# Patient Record
Sex: Male | Born: 1940 | Race: White | Hispanic: No | Marital: Married | Smoking: Former smoker
Health system: Southern US, Community
[De-identification: ages and names within clinical notes are randomized; demographics above are authoritative.]

## PROBLEM LIST (undated history)

## (undated) DIAGNOSIS — J90 Pleural effusion, not elsewhere classified: Secondary | ICD-10-CM

## (undated) DIAGNOSIS — I2510 Atherosclerotic heart disease of native coronary artery without angina pectoris: Secondary | ICD-10-CM

## (undated) DIAGNOSIS — I10 Essential (primary) hypertension: Secondary | ICD-10-CM

## (undated) DIAGNOSIS — R972 Elevated prostate specific antigen [PSA]: Secondary | ICD-10-CM

## (undated) DIAGNOSIS — I25810 Atherosclerosis of coronary artery bypass graft(s) without angina pectoris: Secondary | ICD-10-CM

## (undated) DIAGNOSIS — I480 Paroxysmal atrial fibrillation: Secondary | ICD-10-CM

## (undated) DIAGNOSIS — J302 Other seasonal allergic rhinitis: Secondary | ICD-10-CM

## (undated) DIAGNOSIS — K219 Gastro-esophageal reflux disease without esophagitis: Secondary | ICD-10-CM

## (undated) DIAGNOSIS — M1990 Unspecified osteoarthritis, unspecified site: Secondary | ICD-10-CM

## (undated) DIAGNOSIS — Z9289 Personal history of other medical treatment: Secondary | ICD-10-CM

## (undated) DIAGNOSIS — I200 Unstable angina: Secondary | ICD-10-CM

## (undated) DIAGNOSIS — R079 Chest pain, unspecified: Secondary | ICD-10-CM

## (undated) DIAGNOSIS — R072 Precordial pain: Secondary | ICD-10-CM

## (undated) DIAGNOSIS — E785 Hyperlipidemia, unspecified: Secondary | ICD-10-CM

## (undated) DIAGNOSIS — C801 Malignant (primary) neoplasm, unspecified: Secondary | ICD-10-CM

## (undated) DIAGNOSIS — I4891 Unspecified atrial fibrillation: Secondary | ICD-10-CM

## (undated) HISTORY — DX: Hyperlipidemia, unspecified: E78.5

## (undated) HISTORY — PX: CHOLECYSTECTOMY OPEN: SUR202

## (undated) HISTORY — DX: Unstable angina: I20.0

## (undated) HISTORY — DX: Paroxysmal atrial fibrillation: I48.0

## (undated) HISTORY — DX: Atherosclerosis of coronary artery bypass graft(s) without angina pectoris: I25.810

## (undated) HISTORY — DX: Gastro-esophageal reflux disease without esophagitis: K21.9

## (undated) HISTORY — DX: Pleural effusion, not elsewhere classified: J90

## (undated) HISTORY — DX: Personal history of other medical treatment: Z92.89

## (undated) HISTORY — PX: ESOPHAGOGASTRODUODENOSCOPY (EGD) WITH ESOPHAGEAL DILATION: SHX5812

## (undated) HISTORY — DX: Essential (primary) hypertension: I10

## (undated) HISTORY — DX: Atherosclerotic heart disease of native coronary artery without angina pectoris: I25.10

## (undated) HISTORY — PX: EXPLORATORY LAPAROTOMY: SUR591

## (undated) HISTORY — PX: HYDROCELE EXCISION: SHX482

## (undated) HISTORY — DX: Precordial pain: R07.2

## (undated) HISTORY — DX: Chest pain, unspecified: R07.9

## (undated) HISTORY — DX: Unspecified atrial fibrillation: I48.91

## (undated) HISTORY — PX: CARDIAC CATHETERIZATION: SHX172

---

## 2001-07-16 ENCOUNTER — Ambulatory Visit (HOSPITAL_COMMUNITY): Admission: RE | Admit: 2001-07-16 | Discharge: 2001-07-16 | Payer: Self-pay | Admitting: Internal Medicine

## 2001-07-16 ENCOUNTER — Encounter: Payer: Self-pay | Admitting: Internal Medicine

## 2002-02-04 ENCOUNTER — Encounter: Admission: RE | Admit: 2002-02-04 | Discharge: 2002-02-04 | Payer: Self-pay | Admitting: Otolaryngology

## 2002-02-04 ENCOUNTER — Encounter: Payer: Self-pay | Admitting: Otolaryngology

## 2002-04-05 ENCOUNTER — Other Ambulatory Visit: Admission: RE | Admit: 2002-04-05 | Discharge: 2002-04-05 | Payer: Self-pay | Admitting: General Surgery

## 2002-07-18 ENCOUNTER — Encounter: Payer: Self-pay | Admitting: Internal Medicine

## 2002-07-18 ENCOUNTER — Ambulatory Visit (HOSPITAL_COMMUNITY): Admission: RE | Admit: 2002-07-18 | Discharge: 2002-07-18 | Payer: Self-pay | Admitting: Internal Medicine

## 2003-01-30 ENCOUNTER — Ambulatory Visit (HOSPITAL_COMMUNITY): Admission: RE | Admit: 2003-01-30 | Discharge: 2003-01-30 | Payer: Self-pay | Admitting: Internal Medicine

## 2003-07-26 ENCOUNTER — Ambulatory Visit (HOSPITAL_COMMUNITY): Admission: RE | Admit: 2003-07-26 | Discharge: 2003-07-26 | Payer: Self-pay | Admitting: Internal Medicine

## 2003-10-06 ENCOUNTER — Ambulatory Visit (HOSPITAL_COMMUNITY): Admission: RE | Admit: 2003-10-06 | Discharge: 2003-10-06 | Payer: Self-pay | Admitting: Internal Medicine

## 2004-04-28 ENCOUNTER — Emergency Department (HOSPITAL_COMMUNITY): Admission: EM | Admit: 2004-04-28 | Discharge: 2004-04-28 | Payer: Self-pay | Admitting: Emergency Medicine

## 2005-01-23 ENCOUNTER — Ambulatory Visit (HOSPITAL_COMMUNITY): Admission: RE | Admit: 2005-01-23 | Discharge: 2005-01-23 | Payer: Self-pay | Admitting: Internal Medicine

## 2010-02-07 ENCOUNTER — Encounter: Payer: Self-pay | Admitting: Cardiovascular Disease

## 2010-02-07 ENCOUNTER — Ambulatory Visit: Payer: Self-pay | Admitting: Internal Medicine

## 2010-02-07 ENCOUNTER — Inpatient Hospital Stay (HOSPITAL_COMMUNITY): Admission: EM | Admit: 2010-02-07 | Discharge: 2010-02-20 | Payer: Self-pay | Admitting: Emergency Medicine

## 2010-02-08 ENCOUNTER — Ambulatory Visit: Payer: Self-pay | Admitting: Thoracic Surgery (Cardiothoracic Vascular Surgery)

## 2010-02-08 ENCOUNTER — Encounter (INDEPENDENT_AMBULATORY_CARE_PROVIDER_SITE_OTHER): Payer: Self-pay | Admitting: Internal Medicine

## 2010-02-10 ENCOUNTER — Encounter: Payer: Self-pay | Admitting: Thoracic Surgery (Cardiothoracic Vascular Surgery)

## 2010-02-13 HISTORY — PX: CORONARY ARTERY BYPASS GRAFT: SHX141

## 2010-02-25 ENCOUNTER — Ambulatory Visit: Payer: Self-pay | Admitting: Thoracic Surgery (Cardiothoracic Vascular Surgery)

## 2010-02-25 ENCOUNTER — Telehealth: Payer: Self-pay | Admitting: Cardiovascular Disease

## 2010-02-25 ENCOUNTER — Encounter: Payer: Self-pay | Admitting: Cardiovascular Disease

## 2010-02-25 LAB — CONVERTED CEMR LAB: Prothrombin Time: 17.4 s

## 2010-02-26 ENCOUNTER — Encounter: Payer: Self-pay | Admitting: Cardiovascular Disease

## 2010-02-28 ENCOUNTER — Telehealth: Payer: Self-pay | Admitting: Cardiovascular Disease

## 2010-02-28 ENCOUNTER — Encounter: Payer: Self-pay | Admitting: Cardiology

## 2010-03-01 ENCOUNTER — Ambulatory Visit: Payer: Self-pay | Admitting: Cardiothoracic Surgery

## 2010-03-07 DIAGNOSIS — I25810 Atherosclerosis of coronary artery bypass graft(s) without angina pectoris: Secondary | ICD-10-CM | POA: Insufficient documentation

## 2010-03-07 DIAGNOSIS — I10 Essential (primary) hypertension: Secondary | ICD-10-CM | POA: Insufficient documentation

## 2010-03-07 DIAGNOSIS — J90 Pleural effusion, not elsewhere classified: Secondary | ICD-10-CM | POA: Insufficient documentation

## 2010-03-07 DIAGNOSIS — K219 Gastro-esophageal reflux disease without esophagitis: Secondary | ICD-10-CM | POA: Insufficient documentation

## 2010-03-07 DIAGNOSIS — I4891 Unspecified atrial fibrillation: Secondary | ICD-10-CM | POA: Insufficient documentation

## 2010-03-07 DIAGNOSIS — E785 Hyperlipidemia, unspecified: Secondary | ICD-10-CM

## 2010-03-07 HISTORY — DX: Essential (primary) hypertension: I10

## 2010-03-07 HISTORY — DX: Gastro-esophageal reflux disease without esophagitis: K21.9

## 2010-03-07 HISTORY — DX: Pleural effusion, not elsewhere classified: J90

## 2010-03-07 HISTORY — DX: Atherosclerosis of coronary artery bypass graft(s) without angina pectoris: I25.810

## 2010-03-07 HISTORY — DX: Hyperlipidemia, unspecified: E78.5

## 2010-03-07 HISTORY — DX: Unspecified atrial fibrillation: I48.91

## 2010-03-11 ENCOUNTER — Encounter
Admission: RE | Admit: 2010-03-11 | Discharge: 2010-03-11 | Payer: Self-pay | Admitting: Thoracic Surgery (Cardiothoracic Vascular Surgery)

## 2010-03-11 ENCOUNTER — Ambulatory Visit: Payer: Self-pay | Admitting: Thoracic Surgery (Cardiothoracic Vascular Surgery)

## 2010-03-13 ENCOUNTER — Encounter: Payer: Self-pay | Admitting: Cardiovascular Disease

## 2010-03-13 ENCOUNTER — Encounter: Payer: Self-pay | Admitting: Internal Medicine

## 2010-03-13 LAB — CONVERTED CEMR LAB: Prothrombin Time: 19.1 s

## 2010-03-14 ENCOUNTER — Ambulatory Visit: Payer: Self-pay | Admitting: Cardiovascular Disease

## 2010-03-15 ENCOUNTER — Telehealth: Payer: Self-pay | Admitting: Cardiovascular Disease

## 2010-03-20 ENCOUNTER — Encounter: Payer: Self-pay | Admitting: Internal Medicine

## 2010-03-20 ENCOUNTER — Encounter: Payer: Self-pay | Admitting: Cardiovascular Disease

## 2010-03-20 LAB — CONVERTED CEMR LAB
POC INR: 1.7
Prothrombin Time: 16.9 s

## 2010-03-21 ENCOUNTER — Encounter (HOSPITAL_COMMUNITY): Admission: RE | Admit: 2010-03-21 | Discharge: 2010-06-24 | Payer: Self-pay | Admitting: Cardiovascular Disease

## 2010-03-22 ENCOUNTER — Telehealth: Payer: Self-pay | Admitting: Cardiovascular Disease

## 2010-03-27 ENCOUNTER — Ambulatory Visit: Payer: Self-pay | Admitting: Internal Medicine

## 2010-04-04 ENCOUNTER — Encounter: Payer: Self-pay | Admitting: Cardiovascular Disease

## 2010-04-10 ENCOUNTER — Ambulatory Visit: Payer: Self-pay | Admitting: Cardiology

## 2010-04-18 ENCOUNTER — Encounter: Payer: Self-pay | Admitting: Cardiovascular Disease

## 2010-04-19 ENCOUNTER — Ambulatory Visit: Payer: Self-pay | Admitting: Cardiology

## 2010-04-19 LAB — CONVERTED CEMR LAB: POC INR: 1.7

## 2010-04-22 ENCOUNTER — Encounter: Payer: Self-pay | Admitting: Cardiovascular Disease

## 2010-04-29 ENCOUNTER — Ambulatory Visit: Payer: Self-pay | Admitting: Cardiology

## 2010-04-29 LAB — CONVERTED CEMR LAB: POC INR: 1.9

## 2010-05-13 ENCOUNTER — Ambulatory Visit: Payer: Self-pay | Admitting: Cardiology

## 2010-05-14 ENCOUNTER — Ambulatory Visit: Payer: Self-pay | Admitting: Cardiovascular Disease

## 2010-05-14 ENCOUNTER — Ambulatory Visit: Payer: Self-pay

## 2010-05-15 LAB — CONVERTED CEMR LAB
ALT: 18 units/L (ref 0–53)
Alkaline Phosphatase: 64 units/L (ref 39–117)
CO2: 28 meq/L (ref 19–32)
Calcium: 8.7 mg/dL (ref 8.4–10.5)
Creatinine, Ser: 1 mg/dL (ref 0.4–1.5)
GFR calc non Af Amer: 80.34 mL/min (ref >60)
Glucose, Bld: 92 mg/dL (ref 70–99)
LDL Cholesterol: 55 mg/dL (ref 0–99)
Sodium: 145 meq/L (ref 135–145)
Total Protein: 6.5 g/dL (ref 6.0–8.3)

## 2010-05-27 ENCOUNTER — Ambulatory Visit: Payer: Self-pay | Admitting: Cardiology

## 2010-05-27 LAB — CONVERTED CEMR LAB: POC INR: 1.8

## 2010-06-03 ENCOUNTER — Encounter: Payer: Self-pay | Admitting: Cardiovascular Disease

## 2010-06-07 ENCOUNTER — Encounter: Payer: Self-pay | Admitting: Cardiovascular Disease

## 2010-06-10 ENCOUNTER — Encounter: Payer: Self-pay | Admitting: Cardiovascular Disease

## 2010-06-10 ENCOUNTER — Ambulatory Visit: Payer: Self-pay | Admitting: Thoracic Surgery (Cardiothoracic Vascular Surgery)

## 2010-08-15 ENCOUNTER — Encounter: Payer: Self-pay | Admitting: Cardiovascular Disease

## 2010-10-22 ENCOUNTER — Encounter: Payer: Self-pay | Admitting: Cardiovascular Disease

## 2010-11-11 ENCOUNTER — Encounter: Payer: Self-pay | Admitting: Cardiovascular Disease

## 2010-11-11 ENCOUNTER — Ambulatory Visit
Admission: RE | Admit: 2010-11-11 | Discharge: 2010-11-11 | Payer: Self-pay | Source: Home / Self Care | Attending: Cardiovascular Disease | Admitting: Cardiovascular Disease

## 2010-12-03 NOTE — Medication Information (Signed)
Summary: [facility]in Clinic  Anticoagulant Therapy  Managed by: Weston Brass, PharmD Referring MD: Juliette Alcide MD: Excell Seltzer MD, Casimiro Needle Indication 1: Atrial Fibrillation Lab Used: [facility] Site: [facility] PT 17.4 INR POC 1.7  Dietary changes: no    Health status changes: no    Bleeding/hemorrhagic complications: yes       Details: RN states pt has hematoma on leg where graft was harvested.  He has appt with MD today.   Recent/future hospitalizations: yes       Details: Recent CABG- developed Afib after procedure.  Discharged 4/21  Any changes in medication regimen? yes       Details: on amiodarone  Recent/future dental: no  Any missed doses?: no       Is patient compliant with meds? yes      Comments: Spoke with pt's wife.  Gave her dosing instructions   Anticoagulation Management History:      His anticoagulation is being managed by telephone today.  Positive risk factors for bleeding include an age of 74 years or older.  The bleeding index is 'intermediate risk'.  Negative CHADS2 values include Age > 25 years old.  Prothrombin time is 17.4.  Anticoagulation responsible provider: Excell Seltzer MD, Casimiro Needle.  INR POC: 1.7.    Anticoagulation Management Assessment/Plan:      The target INR is 2.0-3.0.  The next INR is due 03/04/2010.  Anticoagulation instructions were given to home health nurse.  Results were reviewed/authorized by Weston Brass, PharmD.  He was notified by Weston Brass PharmD.         Current Anticoagulation Instructions: INR 1.7  Increase dose to 1 tablet every day except 1 1/2 tablet on Monday, Wednesday and Friday.  Gave orders to Tanya with [facility]

## 2010-12-03 NOTE — Miscellaneous (Signed)
Summary: [facility]   [facility]   Imported By: Roderic Ovens 05/30/2010 15:56:02  _____________________________________________________________________  External Attachment:    Type:   Image     Comment:   External Document

## 2010-12-03 NOTE — Procedures (Signed)
Summary: Event  Event   Imported By: Marylou Mccoy 06/07/2010 08:42:02  _____________________________________________________________________  External Attachment:    Type:   Image     Comment:   External Document  Appended Document: Event Pt. aware

## 2010-12-03 NOTE — Letter (Signed)
Summary: [facility] Office Note   [facility] Office Note   Imported By: Roderic Ovens 06/21/2010 15:14:58  _____________________________________________________________________  External Attachment:    Type:   Image     Comment:   External Document

## 2010-12-03 NOTE — Progress Notes (Signed)
Summary: talk to nurse  Phone Note From Other Clinic   Caller: nurse Kenney Houseman Summary of Call: Per Kenney Houseman wants to know if pt can be on the CDP program. please call nurse at [phone] Initial call taken by: Edman Circle,  February 28, 2010 10:56 AM  Follow-up for Phone Call        Spoke with Kenney Houseman (home health RN seeing pt). She would like to enroll pt in CDP program. In this program pt is given heart rate monitor, scales, bp cuff and Oxygen saturation monitor and taught to use each AM. These vital signs are then transmitted via phone to Home Health RN daily to evaluate.  There is no additional charge to pt. RN will continue to see pt twice weekly. CDP program to go for 60 days and then re-evaluate to see if it should be continued. I gave approval to enroll pt in this Follow-up by: Dossie Arbour, RN, BSN,  February 28, 2010 11:16 AM  Additional Follow-up for Phone Call Additional follow up Details #1::        That sounds great. Can we help arrange this? cdm Additional Follow-up by: Verne Carrow, MD,  March 01, 2010 9:02 AM    Additional Follow-up for Phone Call Additional follow up Details #2::    Has been arranged. Follow-up by: Dossie Arbour, RN, BSN,  March 01, 2010 9:16 AM

## 2010-12-03 NOTE — Miscellaneous (Signed)
Summary: [facility]  [facility]   Imported By: Roderic Ovens 04/23/2010 14:54:00  _____________________________________________________________________  External Attachment:    Type:   Image     Comment:   External Document

## 2010-12-03 NOTE — Miscellaneous (Signed)
Summary: [facility] Face to Face Encounter Documentation  [facility] Face to Face Encounter Documentation   Imported By: Roderic Ovens 05/10/2010 15:19:53  _____________________________________________________________________  External Attachment:    Type:   Image     Comment:   External Document

## 2010-12-03 NOTE — Medication Information (Signed)
Summary: Cou[facility]in Clinic  Anticoagulant Therapy  Managed by: Bethena Midget, RN, BSN Referring MD: Juliette Alcide MD: Ladona Ridgel MD, Sharlot Gowda Indication 1: Atrial Fibrillation Lab Used: [facility] Site: [address] PT 19.1 INR POC 1.9 INR RANGE 2.0-3.0  Dietary changes: no    Health status changes: no    Bleeding/hemorrhagic complications: no    Recent/future hospitalizations: no    Any changes in medication regimen? no    Recent/future dental: no  Any missed doses?: no       Is patient compliant with meds? yes       Anticoagulation Management History:      His anticoagulation is being managed by telephone today.  Positive risk factors for bleeding include an age of 71 years or older.  The bleeding index is 'intermediate risk'.  Positive CHADS2 values include History of HTN.  Negative CHADS2 values include Age > 44 years old.  Prothrombin time is 19.1.  Anticoagulation responsible provider: Ladona Ridgel MD, Sharlot Gowda.  INR POC: 1.9.    Anticoagulation Management Assessment/Plan:      The patient's current anticoagulation dose is Warfarin sodium 2.5 mg tabs: Use as directed by Anticoagualtion Clinic.  The target INR is 2.0-3.0.  The next INR is due 03/20/2010.  Anticoagulation instructions were given to home health nurse.  Results were reviewed/authorized by Bethena Midget, RN, BSN.  He was notified by Bethena Midget, RN, BSN.         Prior Anticoagulation Instructions: 2 tabs = 5mg  today then resume recheck next week Thur or Fri when [facility] RN at home  Current Anticoagulation Instructions: INR 1.9 Today 5mg s then change dose 3.75mg s daily except 2.5mg  on T,T, and Sat. Recheck in one week. Orders given to [place] with [facility] while at home with dose and redraw date of 03/20/10.

## 2010-12-03 NOTE — Medication Information (Signed)
Summary: rov/sp  Anticoagulant Therapy  Managed by: Eda Keys, PharmD Referring MD: Juliette Alcide MD: Johney Frame MD, Fayrene Fearing Indication 1: Atrial Fibrillation Lab Used: [facility] Site: [address] INR POC 2.0 INR RANGE 2.0-3.0  Dietary changes: no    Health status changes: yes       Details: Pt has notice jitteriness since surgery and fatigued.  This has been going on for about a week.    Bleeding/hemorrhagic complications: no    Recent/future hospitalizations: no    Any changes in medication regimen? no    Recent/future dental: no  Any missed doses?: no       Is patient compliant with meds? yes       Allergies: 1)  ! Terramycin  Anticoagulation Management History:      The patient is taking warfarin and comes in today for a routine follow up visit.  Positive risk factors for bleeding include an age of 71 years or older.  The bleeding index is 'intermediate risk'.  Positive CHADS2 values include History of HTN.  Negative CHADS2 values include Age > 40 years old.  Anticoagulation responsible provider: Yazan Gatling MD, Fayrene Fearing.  INR POC: 2.0.  Cuvette Lot#: [id number].  Exp: 06/2011.    Anticoagulation Management Assessment/Plan:      The patient's current anticoagulation dose is Warfarin sodium 2.5 mg tabs: Use as directed by Anticoagualtion Clinic.  The target INR is 2.0-3.0.  The next INR is due 04/10/2010.  Anticoagulation instructions were given to home health nurse.  Results were reviewed/authorized by Eda Keys, PharmD.  He was notified by Eda Keys.         Prior Anticoagulation Instructions: INR 1.7  Take 2 tablets today then increase dose to 1 1/2 tablets every day except 1 tablet on Tuesday and Saturday.  Pt discharged from [facility] today.  WIll make appt to come into office.   Current Anticoagulation Instructions: INR 2.0  Start NEW dosing schedule of 1.5 tablets everyday, except 1 tablet on Tuesday.  Return to clinic in 2 weeks.

## 2010-12-03 NOTE — Miscellaneous (Signed)
Summary: [facility] Order Addend[facility]  [facility] Order Addend[facility]   Imported By: Roderic Ovens 04/17/2010 16:37:47  _____________________________________________________________________  External Attachment:    Type:   Image     Comment:   External Document

## 2010-12-03 NOTE — Progress Notes (Signed)
Summary: hypotension   Phone Note Other Incoming   Caller: Kenney Houseman- RN with [facility] Summary of Call: Kenney Houseman called today to report that the pt's bp on friday was 90/60. Today the pt's bp is 80/60 and 102/60 with a HR of 70. The pt is feeling weak, but no complaints of dizziness or lightheadedness when I asked the nurse. The pt is on amiodarone 200mg  two times a day, metoprolol 50mg  two times a day, and he is scheduled for his last dose of lasix 40mg  today. I will review with Dr. Excell Seltzer (DOD) and call the pt back. Initial call taken by: Sherri Rad, RN, BSN,  February 25, 2010 3:02 PM  Follow-up for Phone Call        I have reviewed the above with Dr. Excell Seltzer. Orders received to decrease metoprolol to 25mg  two times a day. I have called and discussed this with the pt's wife. She verbalizes understanding of the med change. The pt will f/u with Dr. Clifton James on 5/12.  Follow-up by: Sherri Rad, RN, BSN,  February 25, 2010 4:44 PM    New/Updated Medications: METOPROLOL TARTRATE 25 MG TABS (METOPROLOL TARTRATE) Take one tablet by mouth twice a day

## 2010-12-03 NOTE — Assessment & Plan Note (Signed)
Summary: PER CHECK OUT ALSO NEEDS LABS/L/LSAF   Visit Type:  Follow-up Primary Provider:  Dr Thea Silversmith  CC:  just sore.  History of Present Illness: 70 yo WM with history of HTN, hyperlipidemia and recent diagnosis of CAD with admission to [facility] 02/07/10 with c/o chest pain, found to have NSTEMI. Cath on 02/09/10 with severe stenosis of mid LAD at bifurcation of large diagonal branch which I was unable to cross with a wire. He then underwent 2V CABG by Dr. Cornelius Moras on 02/13/10. Post-operative course complicated by post-op anemia and atrial fibrillation.  He has had no chest pain or SOB.  He has had no awareness of irregularity of his heart rhythm since discharge home. He is feeling great. He has been at cardiac rehab and is doing well. His chest wall is sore after exercise. He has been followed in coumadin clinic.   Current Medications (verified): 1)  Lopressor 50 Mg Tabs (Metoprolol Tartrate) .... 1/2 Tab Two Times A Day 2)  Warfarin Sodium 2.5 Mg Tabs (Warfarin Sodium) .... Use As Directed By Anticoagualtion Clinic 3)  Aspirin 81 Mg Tbec (Aspirin) .... Take One Tablet By Mouth Daily 4)  Crestor 40 Mg Tabs (Rosuvastatin Calcium) .Marland Kitchen.. 1 Tab At Bedtime 5)  B-Complex/b-12  Tabs (B Complex Vitamins) .Marland Kitchen.. 1 Tab Once Daily 6)  Nexium 40 Mg Cpdr (Esomeprazole Magnesium) .Marland Kitchen.. 1 Tab Once Daily  Allergies (verified): 1)  ! 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 No tobacco No alcohol No illicit drug use Married, 2 children Retired, worked at a [employer], clinical.   Review of Systems  The patient denies fatigue, malaise, fever, weight gain/loss,  vision loss, decreased hearing, hoarseness, chest pain, palpitations, shortness of breath, prolonged cough, wheezing, sleep apnea, coughing up blood, abdominal pain, blood in stool, nausea, vomiting, diarrhea, heartburn, incontinence, blood in urine, muscle weakness, joint pain, leg swelling, rash, skin lesions, headache, fainting, dizziness, depression, anxiety, enlarged lymph nodes, easy bruising or bleeding, and environmental allergies.    Vital Signs:  Patient profile:   70 year old male Height:      73 inches Weight:      182 pounds BMI:     24.10 Pulse rate:   75 / minute BP sitting:   112 / 62  (left arm) Cuff size:   regular  Vitals Entered By: Hardin Negus, RMA (May 14, 2010 8:47 AM)  Physical Exam  General:  General: Well developed, well nourished, NAD Musculoskeletal: Muscle strength 5/5 all ext Psychiatric: Mood and affect normal Neck: No JVD, no carotid bruits, no thyromegaly, no lymphadenopathy. Lungs:Clear bilaterally, no wheezes, rhonci, crackles CV: RRR no murmurs, gallops rubs Abdomen: soft, NT, ND, BS present Extremities: No edema, pulses 2+.    Impression & Recommendations:  Problem # 1:  CAD, ARTERY BYPASS GRAFT (ICD-414.04) Doing well. Stable post bypass. Continue statin, ASA and beta blocker.  Continue cardiac rehab.   His updated medication list for this problem includes:    Lopressor 50 Mg Tabs (Metoprolol tartrate) .Marland Kitchen... 1/2 tab two times a day    Warfarin Sodium 2.5 Mg Tabs (Warfarin sodium) ..... Use as directed by anticoagualtion clinic    Aspirin 81 Mg Tbec (Aspirin) .Marland Kitchen... Take one tablet by mouth daily  Problem # 2:  PAROXYSMAL ATRIAL FIBRILLATION (ICD-427.31)  He is on coumadin. Will have him wear 21 day event monitor to see if he is holding sinus rhythm. If he has no evidence of atrial fib, will stop coumadin.   His updated medication list for this problem includes:    Lopressor 50 Mg Tabs (Metoprolol tartrate) .Marland Kitchen... 1/2 tab two times a  day    Warfarin Sodium 2.5 Mg Tabs (Warfarin sodium) ..... Use as directed by anticoagualtion clinic    Aspirin 81 Mg Tbec (Aspirin) .Marland Kitchen... Take one tablet by mouth daily  Orders: Event (Event) TLB-BMP (Basic Metabolic Panel-BMET) (80048-METABOL)  Problem # 3:  HYPERLIPIDEMIA (ICD-272.4)  Check LFTs, lipids and BMET today.  Continue statin.  His updated medication list for this problem includes:    Crestor 40 Mg Tabs (Rosuvastatin calcium) .Marland Kitchen... 1 tab at bedtime  Orders: TLB-Lipid Panel (80061-LIPID) TLB-Hepatic/Liver Function Pnl (80076-HEPATIC)  Patient Instructions: 1)  Your physician recommends that you schedule a follow-up appointment in: 6 months 2)  Your physician has recommended that you wear an event monitor.  Event monitors are medical devices that record the heart's electrical activity. Doctors most often use these monitors to diagnose arrhythmias. Arrhythmias are problems with the speed or rhythm of the heartbeat. The monitor is a small, portable device. You can wear one while you do your normal daily activities. This is usually used to diagnose what is causing palpitations/syncope (passing out).  Appended Document: PER CHECK OUT ALSO NEEDS LABS/L/LSAF Reviewed event monitor. No evidence of atrial fib. He can stop coumadin. Can we call him Ruben Burton? thanks, chris  Appended Document: PER CHECK OUT ALSO NEEDS LABS/L/LSAF left message to call back.  Appended Document: PER CHECK OUT ALSO NEEDS LABS/L/LSAF Pt. notified he can stop coumadin when here for coumadin appt today.

## 2010-12-03 NOTE — Medication Information (Signed)
Summary: Coumadin Clinic  Anticoagulant Therapy  Managed by: Inactive Referring MD: Clifton James PCP: Dr Laure Kidney MD: Eden Emms MD, Theron Arista Indication 1: Atrial Fibrillation Lab Used: [facility] Site: [address] INR RANGE 2.0-3.0          Comments: Pt in sinus rhythm after 21 day event monitor.  Dr. Clifton James stopped Coumadin.   Allergies: 1)  ! Terramycin  Anticoagulation Management History:      Positive risk factors for bleeding include an age of 71 years or older.  The bleeding index is 'intermediate risk'.  Positive CHADS2 values include History of HTN.  Negative CHADS2 values include Age > 64 years old.  Anticoagulation responsible provider: Eden Emms MD, Theron Arista.  Exp: 08/2011.    Anticoagulation Management Assessment/Plan:      The patient's current anticoagulation dose is Warfarin sodium 2.5 mg tabs: Use as directed by Anticoagualtion Clinic.  The target INR is 2.0-3.0.  The next INR is due 06/07/2010.  Anticoagulation instructions were given to patient.  Results were reviewed/authorized by Inactive.         Prior Anticoagulation Instructions: INR 1.8 Today take 3 tablets then change dose to 2 tablets everyday except 1.5 tablets on Tuesdays, Thursdays and Saturdays. Recheck in 2 weeks.

## 2010-12-03 NOTE — Progress Notes (Signed)
Summary: Calling about the blood checks  Phone Note Call from Patient Call back at Home Phone [phone]   Caller: Ruben Burton Summary of Call: Pt wife calling regarding the pt coumadin being checked Initial call taken by: Judie Grieve,  Mar 15, 2010 3:33 PM  Follow-up for Phone Call        Spoke with pt's wife. She states last home health nurse visit will be next Wednesday and INR will be checked at that time. I told wife that after we receive these results the coumadin clinic in our office will be in touch with them about scheduling follow up appts in the coumadin clinic. Follow-up by: Dossie Arbour, RN, BSN,  Mar 15, 2010 5:17 PM

## 2010-12-03 NOTE — Progress Notes (Signed)
Summary: Rehab form  Phone Note From Other Clinic   Caller: carlette Summary of Call: Per cardiac rehab needs order for heart rate for rehab.  Initial call taken by: Edman Circle,  Mar 22, 2010 3:19 PM  Follow-up for Phone Call        Dr Clifton James is back in the office on 03/26/10.  Form is in Dr Gibson Ramp reports to sign folder. Will address at that time.  Follow-up by: Julieta Gutting, RN, BSN,  Mar 22, 2010 3:32 PM  Additional Follow-up for Phone Call Additional follow up Details #1::        Form completed and faxed to cardiac rehab.  Additional Follow-up by: Dossie Arbour, RN, BSN,  Mar 26, 2010 12:53 PM

## 2010-12-03 NOTE — Letter (Signed)
Summary: Cardiac Rehab   Cardiac Rehab   Imported By: Erle Crocker 04/10/2010 11:17:28  _____________________________________________________________________  External Attachment:    Type:   Image     Comment:   External Document

## 2010-12-03 NOTE — Medication Information (Signed)
Summary: rov/sp  Anticoagulant Therapy  Managed by: Cloyde Reams, RN, BSN Referring MD: Juliette Alcide MD: Shirlee Latch MD, Serai Tukes Indication 1: Atrial Fibrillation Lab Used: [facility] Site: [address] INR POC 1.7 INR RANGE 2.0-3.0  Dietary changes: no    Health status changes: no    Bleeding/hemorrhagic complications: no    Recent/future hospitalizations: no    Any changes in medication regimen? no    Recent/future dental: no  Any missed doses?: no       Is patient compliant with meds? yes       Allergies: 1)  ! Terramycin  Anticoagulation Management History:      The patient is taking warfarin and comes in today for a routine follow up visit.  Positive risk factors for bleeding include an age of 8 years or older.  The bleeding index is 'intermediate risk'.  Positive CHADS2 values include History of HTN.  Negative CHADS2 values include Age > 54 years old.  Anticoagulation responsible provider: Shirlee Latch MD, Bodhi Stenglein.  INR POC: 1.7.  Cuvette Lot#: [id number].  Exp: 07/2011.    Anticoagulation Management Assessment/Plan:      The patient's current anticoagulation dose is Warfarin sodium 2.5 mg tabs: Use as directed by Anticoagualtion Clinic.  The target INR is 2.0-3.0.  The next INR is due 05/27/2010.  Anticoagulation instructions were given to patient.  Results were reviewed/authorized by Cloyde Reams, RN, BSN.  He was notified by Cloyde Reams RN.         Prior Anticoagulation Instructions: INR 1.9  Increase dose to 1 1/2 tablets every day except 2 tablets on Monday and Friday.   Current Anticoagulation Instructions: INR 1.7  Take 2.5 tablets today, then start taking 1.5 tablets daily except 2 tablets on Mondays, Wednesdays, and Fridays.  Recheck 2 weeks.

## 2010-12-03 NOTE — Medication Information (Signed)
Summary: rov/cb  Anticoagulant Therapy  Managed by: Weston Brass, PharmD Referring MD: Juliette Alcide MD: Jens Som MD, Arlys John Indication 1: Atrial Fibrillation Lab Used: [facility] Site: [facility] INR POC 1.9 INR RANGE 2.0-3.0  Dietary changes: no    Health status changes: no    Bleeding/hemorrhagic complications: no    Recent/future hospitalizations: no    Any changes in medication regimen? no    Recent/future dental: no  Any missed doses?: no       Is patient compliant with meds? yes       Allergies: 1)  ! Terramycin  Anticoagulation Management History:      The patient is taking warfarin and comes in today for a routine follow up visit.  Positive risk factors for bleeding include an age of 71 years or older.  The bleeding index is 'intermediate risk'.  Positive CHADS2 values include History of HTN.  Negative CHADS2 values include Age > 65 years old.  Anticoagulation responsible provider: Jens Som MD, Arlys John.  INR POC: 1.9.  Exp: 06/2011.    Anticoagulation Management Assessment/Plan:      The patient's current anticoagulation dose is Warfarin sodium 2.5 mg tabs: Use as directed by Anticoagualtion Clinic.  The target INR is 2.0-3.0.  The next INR is due 05/13/2010.  Anticoagulation instructions were given to patient.  Results were reviewed/authorized by Weston Brass, PharmD.  He was notified by Weston Brass PharmD.         Prior Anticoagulation Instructions: INR 1.7. Take 2 tablets today, then take 1.5 tablets daily except 2 tablets on Fridays. Recheck in 10 days.  Current Anticoagulation Instructions: INR 1.9  Increase dose to 1 1/2 tablets every day except 2 tablets on Monday and Friday.

## 2010-12-03 NOTE — Miscellaneous (Signed)
Summary: [facility]   [facility]   Imported By: Roderic Ovens 05/20/2010 13:12:09  _____________________________________________________________________  External Attachment:    Type:   Image     Comment:   External Document

## 2010-12-03 NOTE — Medication Information (Signed)
Summary: rov/sp  Anticoagulant Therapy  Managed by: Elaina Pattee, PharmD Referring MD: Juliette Alcide MD: Juanda Chance MD, Betzayda Braxton Indication 1: Atrial Fibrillation Lab Used: [facility] Site: [address] INR POC 1.7 INR RANGE 2.0-3.0  Dietary changes: no    Health status changes: no    Bleeding/hemorrhagic complications: no    Recent/future hospitalizations: no    Any changes in medication regimen? yes       Details: Stopped amiodarone recently.  Recent/future dental: no  Any missed doses?: no       Is patient compliant with meds? yes       Allergies: 1)  ! Terramycin  Anticoagulation Management History:      The patient is taking warfarin and comes in today for a routine follow up visit.  Positive risk factors for bleeding include an age of 71 years or older.  The bleeding index is 'intermediate risk'.  Positive CHADS2 values include History of HTN.  Negative CHADS2 values include Age > 68 years old.  Anticoagulation responsible provider: Juanda Chance MD, Smitty Cords.  INR POC: 1.7.  Cuvette Lot#: [id number].  Exp: 06/2011.    Anticoagulation Management Assessment/Plan:      The patient's current anticoagulation dose is Warfarin sodium 2.5 mg tabs: Use as directed by Anticoagualtion Clinic.  The target INR is 2.0-3.0.  The next INR is due 04/29/2010.  Anticoagulation instructions were given to patient.  Results were reviewed/authorized by Elaina Pattee, PharmD.  He was notified by Elaina Pattee, PharmD.         Prior Anticoagulation Instructions: INR 1.6  Take 2 tablets today and tomorrow then increase dose to 1 1/2 tablets every day.    Current Anticoagulation Instructions: INR 1.7. Take 2 tablets today, then take 1.5 tablets daily except 2 tablets on Fridays. Recheck in 10 days.

## 2010-12-03 NOTE — Procedures (Signed)
Summary: Summary Report  Summary Report   Imported By: Erle Crocker 06/11/2010 15:49:39  _____________________________________________________________________  External Attachment:    Type:   Image     Comment:   External Document

## 2010-12-03 NOTE — Assessment & Plan Note (Signed)
Summary: nph/post cabg/lg   Visit Type:  Follow-up  CC:  chest soreness at post operative site...no other complaints today.  History of Present Illness: 70 yo WM with history of HTN, hyperlipidemia and recent diagnosis of CAD with admission to [facility] 02/07/10 with c/o chest pain, found to have NSTEMI. Cath on 02/09/10 with severe stenosis of mid LAD at bifurcation of large diagonal branch which I was unable to cross with a wire. He then underwent 2V CABG by Dr. Cornelius Moras on 02/13/10. Post-operative course complicated by post-op anemia and atrial fibrillation. He was discharged home on amiodarone therapy. He has seen Dr. Cornelius Moras 3 times since discharge. He has a small right thigh hematoma that has been aspirated by TCTS and is getting better. He walked yesterday and felt ok. He has had no chest pain or SOB. No hiccups over last few weeks. He has had no awareness of irregularity of his heart rhythm since discharge home.   Current Medications (verified): 1)  Lopressor 50 Mg Tabs (Metoprolol Tartrate) .... 1/2 Tab Two Times A Day 2)  Warfarin Sodium 2.5 Mg Tabs (Warfarin Sodium) .... Use As Directed By Anticoagualtion Clinic 3)  Amiodarone Hcl 200 Mg Tabs (Amiodarone Hcl) .Marland Kitchen.. 1 Tab Two Times A Day 4)  Aspirin 81 Mg Tbec (Aspirin) .... Take One Tablet By Mouth Daily 5)  Oxycodone Hcl 5 Mg Tabs (Oxycodone Hcl) .Marland Kitchen.. 1-2 Tabs Q 3 Hour As Needed 6)  Crestor 40 Mg Tabs (Rosuvastatin Calcium) .Marland Kitchen.. 1 Tab At Bedtime 7)  B-Complex/b-12  Tabs (B Complex Vitamins) .Marland Kitchen.. 1 Tab Once Daily 8)  Nexium 40 Mg Cpdr (Esomeprazole Magnesium) .Marland Kitchen.. 1 Tab Once Daily  Allergies (verified): 1)  ! Terramycin  Past History:  Past Medical History: PAROXYSMAL ATRIAL FIBRILLATION (ICD-427.31)-post operative PLEURAL EFFUSION, LEFT (ICD-511.9) CAD, ARTERY BYPASS GRAFT (ICD-414.04)-2V (LIMA to LAD, SVG to Diagona) on 4/013/11 HYPERLIPIDEMIA (ICD-272.4) HYPERTENSION (ICD-401.9) GERD (ICD-530.81)    Past Surgical  History: CABG x2 (LIMA to LAD, SVG to Diagonal)) 02/13/10 Cholecystectomy s/p exploratory laparotomy for perforated duodenal ulcer  Family History: Mother-alive, healthy Father-deceased, CVA 3 brothers, 2 sisters. 1 brother with CABG  Social History: No tobacco No alcohol No illicit drug use Married, 2 children Retired, worked at a Biochemist, clinical.   Review of Systems  The patient denies fatigue, malaise, fever, weight gain/loss, vision loss, decreased hearing, hoarseness, chest pain, palpitations, shortness of breath, prolonged cough, wheezing, sleep apnea, coughing up blood, abdominal pain, blood in stool, nausea, vomiting, diarrhea, heartburn, incontinence, blood in urine, muscle weakness, joint pain, leg swelling, rash, skin lesions, headache, fainting, dizziness, depression, anxiety, enlarged lymph nodes, easy bruising or bleeding, and environmental allergies.    Vital Signs:  Patient profile:   70 year old male Height:      73 inches Weight:      176 pounds BMI:     23.30 Pulse rate:   57 / minute Pulse rhythm:   regular BP sitting:   96 / 48  (left arm) Cuff size:   large  Vitals Entered By: Danielle Rankin, CMA (Mar 14, 2010 9:30 AM)  Physical Exam  General:  General: Well developed, well nourished, NAD HEENT: OP clear, mucus membranes moist SKIN: warm, dry.Well healed sternal wound. Small hematoma over inner right thigh. Soft. Surround ecchymosis.  Neuro: No focal deficits Musculoskeletal: Muscle strength 5/5 all ext Psychiatric: Mood and affect normal Neck: No JVD, no carotid bruits, no thyromegaly, no lymphadenopathy. Lungs:Clear bilaterally, no wheezes, rhonci, crackles CV: Bradycardic.  No murmurs, gallops rubs Abdomen: soft, NT, ND, BS present Extremities: No edema, pulses 2+.    EKG  Procedure date:  03/14/2010  Findings:      Sinus bradycardia, rate 57 bpm.   Cardiac Cath  Procedure date:  02/09/2010  Findings:      HEMODYNAMIC FINDINGS:   Central aortic pressure 139/79.  Left ventricular pressure 123/9.  Left ventricular end-diastolic pressure 21.   ANGIOGRAPHIC FINDINGS: 1. The left main coronary artery had mild plaque disease. 2. The left anterior descending was a large vessel that had plaque in     the proximal segment.  The mid segment had a 70% narrowing just     before 99% subtotal occlusion.  There was severe stenosis within     the takeoff of a large diagonal branch.  The diagonal branch was a     large bifurcating vessel that had TIMI I flow. 3. The circumflex artery gave off an early moderate-sized obtuse     marginal branch.  There was mild plaque disease.  The mid and     distal circumflex are moderate sized with mild plaque disease. 4. The right coronary artery was a large dominant vessel with an     ostial 40% stenosis.  The proximal portion of the vessel had a 50%     stenosis.  The midportion of the vessel had serial 40% lesions.     The distal portion vessel had a 40% lesion.  Posterior descending     artery and posterolateral branches had plaque disease. 5. Left ventricular angiogram was performed in the RAO projection and     showed anteroapical hypokinesis with ejection fraction of 55%.   IMPRESSION: 1. Severe disease in the left anterior descending artery at the     bifurcation of a large diagonal branch. 2. Preserved left ventricular systolic function.    Echocardiogram  Procedure date:  02/08/2010  Findings:       Left ventricle: Systolic function was normal. The estimated     ejection fraction was in the range of 55% to 60%.   - Right ventricle: The cavity size was mildly dilated.  Carotid Doppler  Procedure date:  02/10/2010  Findings:       Bilateral: minimal plaque noted. No ICA stenosis. Vertebral artery   flow is antegrade.  Impression & Recommendations:  Problem # 1:  CAD, ARTERY BYPASS GRAFT (ICD-414.04) Stable, now one month post CABG. Continue beta blocker, statin, ASA.  Begin cardiac rehab as planned.   His updated medication list for this problem includes:    Lopressor 50 Mg Tabs (Metoprolol tartrate) .Marland Kitchen... 1/2 tab two times a day    Warfarin Sodium 2.5 Mg Tabs (Warfarin sodium) ..... Use as directed by anticoagualtion clinic    Aspirin 81 Mg Tbec (Aspirin) .Marland Kitchen... 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 Will d/c amiodarone. We will continue the coumadin for the next two months. I will see him back and reassess in two months.   The following medications were removed from the medication list:    Amiodarone Hcl 200 Mg Tabs (Amiodarone hcl) .Marland Kitchen... 1 tab two times a day His updated medication list for this problem includes:    Lopressor 50 Mg Tabs (Metoprolol tartrate) .Marland Kitchen... 1/2 tab two times a day    Warfarin Sodium 2.5 Mg Tabs (Warfarin sodium) ..... Use as directed by anticoagualtion clinic  Aspirin 81 Mg Tbec (Aspirin) .Marland Kitchen... Take one tablet by mouth daily  Problem # 3:  HYPERTENSION (ICD-401.9) BP is on the low side. May improve off of amiodarone. No dizziness or syncope.   The following medications were removed from the medication list:    Lasix 40 Mg Tabs (Furosemide) .Marland Kitchen... 1 tab once daily x 5 days His updated medication list for this problem includes:    Lopressor 50 Mg Tabs (Metoprolol tartrate) .Marland Kitchen... 1/2 tab two times a day    Aspirin 81 Mg Tbec (Aspirin) .Marland Kitchen... Take one tablet by mouth daily  Problem # 4:  HYPERLIPIDEMIA (ICD-272.4) Baseline labs on 02/08/10 with TC 165, HDL 27, LDL 102. Continue Crestor. Will repeat LFTs, fasting lipids in two months.   The following medications were removed from the medication list:    Lovaza 1 Gm Caps (Omega-3-acid ethyl esters) .Marland Kitchen... 1 cap once daily His updated medication list for this problem includes:    Crestor 40 Mg Tabs (Rosuvastatin calcium) .Marland Kitchen... 1 tab at bedtime  Patient Instructions: 1)   Your physician recommends that you schedule a follow-up appointment in: 2 months. (morning appt) 2)  Your physician recommends that you return for a FASTING lipid profile and liver profile in 2 months at next office visit 3)  Your physician has recommended you make the following change in your medication:  4)  Stop amiodarone. Prescriptions: NEXIUM 40 MG CPDR (ESOMEPRAZOLE MAGNESIUM) 1 tab once daily  #90 x 3   Entered by:   Danielle Rankin, CMA   Authorized by:   Verne Carrow, MD   Signed by:   Danielle Rankin, CMA on 03/14/2010   Method used:   Faxed to ...       [facility] education [facility])       [address] [address]       [place], [place]  [address]       Ph: [phone]       Fax: [phone]   RxID:   [id number] CRESTOR 40 MG TABS (ROSUVASTATIN CALCIUM) 1 tab at bedtime  #90 x 3   Entered by:   Danielle Rankin, CMA   Authorized by:   Verne Carrow, MD   Signed by:   Danielle Rankin, CMA on 03/14/2010   Method used:   Faxed to ...       [facility])       [address] [address]       [place], [place]  [address]       Ph: [phone]       Fax: [phone]   RxID:   [id number] LOPRESSOR 50 MG TABS (METOPROLOL TARTRATE) 1/2 tab two times a day  #90 x 3   Entered by:   Danielle Rankin, CMA   Authorized by:   Verne Carrow, MD   Signed by:   Danielle Rankin, CMA on 03/14/2010   Method used:   Faxed to ...       [facility] education [facility])       [address] [address]       [place], [place]  [address]       Ph: [phone]       Fax: [phone]   RxID:   [id number]

## 2010-12-03 NOTE — Medication Information (Signed)
Summary: Coumadin Clinic  Anticoagulant Therapy  Managed by: Leota Sauers, PharmD, BCPS, CPP Referring MD: Juliette Alcide MD: Excell Seltzer MD, Casimiro Needle Indication 1: Atrial Fibrillation Lab Used: [facility] Site: [facility] PT 18.2 INR POC 1.8           Current Medications (verified): 1)  Metoprolol Tartrate 25 Mg Tabs (Metoprolol Tartrate) .... Take One Tablet By Mouth Twice A Day 2)  Warfarin Sodium 2.5 Mg Tabs (Warfarin Sodium) .... Use As Directed By Anticoagualtion Clinic  Anticoagulation Management History:      His anticoagulation is being managed by telephone today.  Positive risk factors for bleeding include an age of 71 years or older.  The bleeding index is 'intermediate risk'.  Negative CHADS2 values include Age > 1 years old.  Prothrombin time is 18.2.  Anticoagulation responsible provider: Excell Seltzer MD, Casimiro Needle.  INR POC: 1.8.    Anticoagulation Management Assessment/Plan:      The patient's current anticoagulation dose is Warfarin sodium 2.5 mg tabs: Use as directed by Anticoagualtion Clinic.  The target INR is 2.0-3.0.  The next INR is due 03/04/2010.  Anticoagulation instructions were given to home health nurse.  Results were reviewed/authorized by Leota Sauers, PharmD, BCPS, CPP.         Prior Anticoagulation Instructions: INR 1.7  Increase dose to 1 tablet every day except 1 1/2 tablet on Monday, Wednesday and Friday.  Gave orders to Kenney Houseman with [facility]  Current Anticoagulation Instructions: 2 tabs = 5mg  today then resume recheck next week Thur or Fri when [facility] RN at home

## 2010-12-03 NOTE — Miscellaneous (Signed)
Summary: [facility] Progress Note   [facility] Progress Note   Imported By: Roderic Ovens 08/21/2010 16:03:04  _____________________________________________________________________  External Attachment:    Type:   Image     Comment:   External Document

## 2010-12-03 NOTE — Medication Information (Signed)
Summary: Coumadin Clinic  Anticoagulant Therapy  Managed by: Weston Brass, PharmD Referring MD: Juliette Alcide MD: Gala Romney MD, Reuel Boom Indication 1: Atrial Fibrillation Lab Used: [facility] Site: [facility] PT 16.9 INR POC 1.7 INR RANGE 2.0-3.0  Dietary changes: no    Health status changes: no    Bleeding/hemorrhagic complications: no    Recent/future hospitalizations: no    Any changes in medication regimen? yes       Details: stop amiodarone last week  Recent/future dental: no  Any missed doses?: no       Is patient compliant with meds? yes       Allergies: 1)  ! Terramycin  Anticoagulation Management History:      His anticoagulation is being managed by telephone today.  Positive risk factors for bleeding include an age of 70 years or older.  The bleeding index is 'intermediate risk'.  Positive CHADS2 values include History of HTN.  Negative CHADS2 values include Age > 5 years old.  Prothrombin time is 16.9.  Anticoagulation responsible provider: Bensimhon MD, Reuel Boom.  INR POC: 1.7.    Anticoagulation Management Assessment/Plan:      The patient's current anticoagulation dose is Warfarin sodium 2.5 mg tabs: Use as directed by Anticoagualtion Clinic.  The target INR is 2.0-3.0.  The next INR is due 03/27/2010.  Anticoagulation instructions were given to home health nurse.  Results were reviewed/authorized by Weston Brass, PharmD.  He was notified by Weston Brass PharmD.         Prior Anticoagulation Instructions: INR 1.9 Today 5mg s then change dose 3.75mg s daily except 2.5mg  on T,T, and Sat. Recheck in one week. Orders given to [place] with [facility] while at home with dose and redraw date of 03/20/10.   Current Anticoagulation Instructions: INR 1.7  Take 2 tablets today then increase dose to 1 1/2 tablets every day except 1 tablet on Tuesday and Saturday.  Pt discharged from [facility] today.  WIll make appt to come into office.

## 2010-12-03 NOTE — Medication Information (Signed)
Summary: rov/ewj  Anticoagulant Therapy  Managed by: Bethena Midget, RN, BSN Referring MD: Sanjuana Kava PCP: Dr Laure Kidney MD: Jens Som MD, Arlys John Indication 1: Atrial Fibrillation Lab Used: [facility]  Site: [address] INR POC 1.8 INR RANGE 2.0-3.0  Dietary changes: no    Health status changes: no    Bleeding/hemorrhagic complications: no    Recent/future hospitalizations: no    Any changes in medication regimen? no    Recent/future dental: no  Any missed doses?: no       Is patient compliant with meds? yes       Allergies: 1)  ! Terramycin  Anticoagulation Management History:      The patient is taking warfarin and comes in today for a routine follow up visit.  Positive risk factors for bleeding include an age of 70 years or older.  The bleeding index is 'intermediate risk'.  Positive CHADS2 values include History of HTN.  Negative CHADS2 values include Age > 49 years old.  Anticoagulation responsible provider: Jens Som MD, Arlys John.  INR POC: 1.8.  Cuvette Lot#: [id number].  Exp: 08/2011.    Anticoagulation Management Assessment/Plan:      The patient's current anticoagulation dose is Warfarin sodium 2.5 mg tabs: Use as directed by Anticoagualtion Clinic.  The target INR is 2.0-3.0.  The next INR is due 06/07/2010.  Anticoagulation instructions were given to patient.  Results were reviewed/authorized by Bethena Midget, RN, BSN.  He was notified by Bethena Midget, RN, BSN.         Prior Anticoagulation Instructions: INR 1.7  Take 2.5 tablets today, then start taking 1.5 tablets daily except 2 tablets on Mondays, Wednesdays, and Fridays.  Recheck 2 weeks.    Current Anticoagulation Instructions: INR 1.8 Today take 3 tablets then change dose to 2 tablets everyday except 1.5 tablets on Tuesdays, Thursdays and Saturdays. Recheck in 2 weeks.

## 2010-12-03 NOTE — Medication Information (Signed)
Summary: rov/eac  Anticoagulant Therapy  Managed by: Weston Brass, PharmD Referring MD: Juliette Alcide MD: Juanda Chance MD, Kaeley Vinje Indication 1: Atrial Fibrillation Lab Used: [facility] Site: [facility] INR POC 1.6 INR RANGE 2.0-3.0  Dietary changes: no    Health status changes: no    Bleeding/hemorrhagic complications: no    Recent/future hospitalizations: no    Any changes in medication regimen? yes       Details: stopped amiodarone about a month ago  Recent/future dental: no  Any missed doses?: no       Is patient compliant with meds? yes       Current Medications (verified): 1)  Lopressor 50 Mg Tabs (Metoprolol Tartrate) .... 1/2 Tab Two Times A Day 2)  Warfarin Sodium 2.5 Mg Tabs (Warfarin Sodium) .... Use As Directed By Anticoagualtion Clinic 3)  Aspirin 81 Mg Tbec (Aspirin) .... Take One Tablet By Mouth Daily 4)  Crestor 40 Mg Tabs (Rosuvastatin Calcium) .Marland Kitchen.. 1 Tab At Bedtime 5)  B-Complex/b-12  Tabs (B Complex Vitamins) .Marland Kitchen.. 1 Tab Once Daily 6)  Nexium 40 Mg Cpdr (Esomeprazole Magnesium) .Marland Kitchen.. 1 Tab Once Daily  Allergies (verified): 1)  ! Terramycin  Anticoagulation Management History:      The patient is taking warfarin and comes in today for a routine follow up visit.  Positive risk factors for bleeding include an age of 70 years or older.  The bleeding index is 'intermediate risk'.  Positive CHADS2 values include History of HTN.  Negative CHADS2 values include Age > 70 years old.  Anticoagulation responsible provider: Juanda Chance MD, Smitty Cords.  INR POC: 1.6.  Cuvette Lot#: [id number].  Exp: 06/2011.    Anticoagulation Management Assessment/Plan:      The patient's current anticoagulation dose is Warfarin sodium 2.5 mg tabs: Use as directed by Anticoagualtion Clinic.  The target INR is 2.0-3.0.  The next INR is due 04/19/2010.  Anticoagulation instructions were given to patient.  Results were reviewed/authorized by Weston Brass, PharmD.  He was notified by Weston Brass  PharmD.         Prior Anticoagulation Instructions: INR 2.0  Start NEW dosing schedule of 1.5 tablets everyday, except 1 tablet on Tuesday.  Return to clinic in 2 weeks.    Current Anticoagulation Instructions: INR 1.6  Take 2 tablets today and tomorrow then increase dose to 1 1/2 tablets every day.

## 2010-12-03 NOTE — Miscellaneous (Signed)
[facility]  [facility]   Imported By: Dixie Dials 04/24/2010 13:20:18  _____________________________________________________________________  External Attachment:    Type:   Image     Comment:   External Document

## 2010-12-03 NOTE — Medication Information (Signed)
Summary: Physician Orders   Physician Orders   Imported By: Roderic Ovens 03/26/2010 13:57:37  _____________________________________________________________________  External Attachment:    Type:   Image     Comment:   External Document

## 2010-12-05 NOTE — Assessment & Plan Note (Signed)
Summary: 6 MONTH ROV.SL   Primary Provider:  Dr Thea Silversmith  CC:  chest soreness the other while driving his tractor.Marland KitchenMarland KitchenMarland KitchenMarland Kitchenpt c/o achiness in calves @ night.....denies any other complaints today.  History of Present Illness: 70 yo WM with history of HTN, hyperlipidemia and diagnosis of CAD with admission to [facility] 02/07/10 with c/o chest pain, found to have NSTEMI. Cath on 02/09/10 with severe stenosis of mid LAD at bifurcation of large diagonal branch which I was unable to cross with a wire. He then underwent 2V CABG by Dr. Cornelius Moras on 02/13/10. Post-operative course complicated by post-op anemia and atrial fibrillation.  I last saw him in July 2011. I had him wear a 21 day event monitor in August 2011 and there was no evidence of recurrence of atrial fibrillation. His coumadin was stopped.   Since his last visit, he has had no chest pain, SOB or awareness of irregularity of his heart rhythm. A large tree limb fell on him a few months ago.  He sustained severe bruising to his abdomen but no broken bones when the limb pinned him to the ground. He and his wife are under much stress because of their son who has bipolar disorder and cannot get treatment.    Current Medications (verified): 1)  Lopressor 50 Mg Tabs (Metoprolol Tartrate) .... 1/2 Tab Two Times A Day 2)  Aspirin 81 Mg Tbec (Aspirin) .... Take One Tablet By Mouth Daily 3)  Crestor 40 Mg Tabs (Rosuvastatin Calcium) .... 1/2 Tab Once Daily 4)  B-Complex/b-12  Tabs (B Complex Vitamins) .Marland Kitchen.. 1 Tab Once Daily 5)  Nexium 40 Mg Cpdr (Esomeprazole Magnesium) .Marland Kitchen.. 1 Tab Once Daily  Allergies: 1)  ! Terramycin  Past History:  Past Medical History: Reviewed history from 05/14/2010 and no changes required. 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 No tobacco No alcohol No illicit drug use Married, 2 children Retired, worked at a Biochemist, clinical.   Review of Systems  The patient denies fatigue, malaise, fever, weight gain/loss, vision loss, decreased hearing, hoarseness, chest pain, palpitations, shortness of breath, prolonged cough, wheezing, sleep apnea, coughing up blood, abdominal pain, blood in stool, nausea, vomiting, diarrhea, heartburn, incontinence, blood in urine, muscle weakness, joint pain, leg swelling, rash, skin lesions, headache, fainting, dizziness, depression, anxiety, enlarged lymph nodes, easy bruising or bleeding, and environmental allergies.    Vital Signs:  Patient profile:   70 year old male Height:      73 inches Weight:      186.75 pounds BMI:     24.73 Pulse rate:   55 / minute Pulse rhythm:   irregular BP sitting:   118 / 80  (left arm) Cuff size:   large  Vitals Entered By: Danielle Rankin, CMA (November 11, 2010 2:00 PM)  Physical Exam  General:  General: Well developed, well nourished, NAD Musculoskeletal: Muscle strength 5/5 all ext Psychiatric: Mood and affect normal Neck: No JVD, no carotid bruits, no thyromegaly, no lymphadenopathy. Lungs:Clear bilaterally, no wheezes, rhonci, crackles CV: RRR no murmurs, gallops rubs Abdomen: soft, NT, ND, BS present Extremities: No edema, pulses 2+.    EKG  Procedure date:  11/11/2010  Findings:      Sinus brady, rate 55 bpm.   Impression & Recommendations:  Problem # 1:  CAD, ARTERY BYPASS GRAFT (ICD-414.04)  Stable. No angina. Continue current meds.   The following medications were removed from the medication list:    Warfarin Sodium 2.5 Mg Tabs (Warfarin sodium) ..... Use as directed by anticoagualtion clinic His updated medication list for this problem includes:    Lopressor 50 Mg Tabs (Metoprolol tartrate) .Marland Kitchen... 1/2 tab two  times a day    Aspirin 81 Mg Tbec (Aspirin) .Marland Kitchen... 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 Continue current meds.   The following medications were removed from the medication list:    Warfarin Sodium 2.5 Mg Tabs (Warfarin sodium) ..... Use as directed by anticoagualtion clinic His updated medication list for this problem includes:    Lopressor 50 Mg Tabs (Metoprolol tartrate) .Marland Kitchen... 1/2 tab two times a day    Aspirin 81 Mg Tbec (Aspirin) .Marland Kitchen... Take one tablet by mouth daily  Patient Instructions: 1)  Your physician recommends that you schedule a follow-up appointment in: 6 months.  2)  Your physician recommends that you continue on your current medications as directed. Please refer to the Current Medication list given to you today.

## 2011-01-21 LAB — BASIC METABOLIC PANEL
BUN: 15 mg/dL (ref 6–23)
BUN: 17 mg/dL (ref 6–23)
CO2: 30 mEq/L (ref 19–32)
Calcium: 8 mg/dL — ABNORMAL LOW (ref 8.4–10.5)
Chloride: 103 mEq/L (ref 96–112)
Chloride: 109 mEq/L (ref 96–112)
Creatinine, Ser: 0.97 mg/dL (ref 0.4–1.5)
GFR calc Af Amer: >60 mL/min (ref >60)
GFR calc Af Amer: >60 mL/min (ref >60)
GFR calc Af Amer: >60 mL/min (ref >60)
GFR calc non Af Amer: >60 mL/min (ref >60)
Glucose, Bld: 120 mg/dL — ABNORMAL HIGH (ref 70–99)
Potassium: 4.1 mEq/L (ref 3.5–5.1)
Sodium: 139 mEq/L (ref 135–145)

## 2011-01-21 LAB — CBC
MCHC: 35.5 g/dL (ref 30.0–36.0)
MCHC: 35.8 g/dL (ref 30.0–36.0)
MCV: 97.4 fL (ref 78.0–100.0)
Platelets: 91 10*3/uL — ABNORMAL LOW (ref 150–400)
RBC: 2.8 MIL/uL — ABNORMAL LOW (ref 4.22–5.81)
RDW: 12.2 % (ref 11.5–15.5)
RDW: 12.8 % (ref 11.5–15.5)
WBC: 8.8 10*3/uL (ref 4.0–10.5)

## 2011-01-21 LAB — PROTIME-INR
INR: 1.25 (ref 0.00–1.49)
INR: 1.37 (ref 0.00–1.49)
Prothrombin Time: 15.6 seconds — ABNORMAL HIGH (ref 11.6–15.2)

## 2011-01-22 LAB — HEPARIN LEVEL (UNFRACTIONATED)
Heparin Unfractionated: 0.24 IU/mL — ABNORMAL LOW (ref 0.30–0.70)
Heparin Unfractionated: 0.37 IU/mL (ref 0.30–0.70)
Heparin Unfractionated: 0.45 IU/mL (ref 0.30–0.70)
Heparin Unfractionated: 0.67 IU/mL (ref 0.30–0.70)
Heparin Unfractionated: 0.81 IU/mL — ABNORMAL HIGH (ref 0.30–0.70)

## 2011-01-22 LAB — POCT I-STAT 4, (NA,K, GLUC, HGB,HCT)
Glucose, Bld: 100 mg/dL — ABNORMAL HIGH (ref 70–99)
Glucose, Bld: 105 mg/dL — ABNORMAL HIGH (ref 70–99)
Glucose, Bld: 109 mg/dL — ABNORMAL HIGH (ref 70–99)
Glucose, Bld: 118 mg/dL — ABNORMAL HIGH (ref 70–99)
Glucose, Bld: 120 mg/dL — ABNORMAL HIGH (ref 70–99)
Glucose, Bld: 99 mg/dL (ref 70–99)
HCT: 24 % — ABNORMAL LOW (ref 39.0–52.0)
HCT: 26 % — ABNORMAL LOW (ref 39.0–52.0)
HCT: 35 % — ABNORMAL LOW (ref 39.0–52.0)
HCT: 37 % — ABNORMAL LOW (ref 39.0–52.0)
Hemoglobin: 11.9 g/dL — ABNORMAL LOW (ref 13.0–17.0)
Hemoglobin: 12.9 g/dL — ABNORMAL LOW (ref 13.0–17.0)
Hemoglobin: 8.2 g/dL — ABNORMAL LOW (ref 13.0–17.0)
Hemoglobin: 8.8 g/dL — ABNORMAL LOW (ref 13.0–17.0)
Hemoglobin: 9.2 g/dL — ABNORMAL LOW (ref 13.0–17.0)
Potassium: 3.9 mEq/L (ref 3.5–5.1)
Potassium: 4 mEq/L (ref 3.5–5.1)
Potassium: 7.1 mEq/L (ref 3.5–5.1)
Sodium: 132 mEq/L — ABNORMAL LOW (ref 135–145)
Sodium: 133 mEq/L — ABNORMAL LOW (ref 135–145)
Sodium: 139 mEq/L (ref 135–145)
Sodium: 139 mEq/L (ref 135–145)

## 2011-01-22 LAB — CBC
HCT: 42.5 % (ref 39.0–52.0)
HCT: 43.2 % (ref 39.0–52.0)
Hemoglobin: 10.9 g/dL — ABNORMAL LOW (ref 13.0–17.0)
Hemoglobin: 11.3 g/dL — ABNORMAL LOW (ref 13.0–17.0)
Hemoglobin: 14.4 g/dL (ref 13.0–17.0)
Hemoglobin: 14.5 g/dL (ref 13.0–17.0)
Hemoglobin: 14.5 g/dL (ref 13.0–17.0)
Hemoglobin: 15 g/dL (ref 13.0–17.0)
Hemoglobin: 15.9 g/dL (ref 13.0–17.0)
MCHC: 33.9 g/dL (ref 30.0–36.0)
MCHC: 34.2 g/dL (ref 30.0–36.0)
MCHC: 34.4 g/dL (ref 30.0–36.0)
MCHC: 34.6 g/dL (ref 30.0–36.0)
MCHC: 34.8 g/dL (ref 30.0–36.0)
MCHC: 34.9 g/dL (ref 30.0–36.0)
MCHC: 35.4 g/dL (ref 30.0–36.0)
MCV: 97.8 fL (ref 78.0–100.0)
MCV: 98.7 fL (ref 78.0–100.0)
MCV: 99.1 fL (ref 78.0–100.0)
Platelets: 106 10*3/uL — ABNORMAL LOW (ref 150–400)
Platelets: 109 10*3/uL — ABNORMAL LOW (ref 150–400)
Platelets: 110 10*3/uL — ABNORMAL LOW (ref 150–400)
Platelets: 114 10*3/uL — ABNORMAL LOW (ref 150–400)
Platelets: 115 10*3/uL — ABNORMAL LOW (ref 150–400)
Platelets: 128 10*3/uL — ABNORMAL LOW (ref 150–400)
RBC: 3.37 MIL/uL — ABNORMAL LOW (ref 4.22–5.81)
RBC: 4.27 MIL/uL (ref 4.22–5.81)
RBC: 4.3 MIL/uL (ref 4.22–5.81)
RBC: 4.42 MIL/uL (ref 4.22–5.81)
RDW: 12 % (ref 11.5–15.5)
RDW: 12.1 % (ref 11.5–15.5)
RDW: 12.2 % (ref 11.5–15.5)
RDW: 12.2 % (ref 11.5–15.5)
RDW: 12.3 % (ref 11.5–15.5)
RDW: 12.3 % (ref 11.5–15.5)
RDW: 12.3 % (ref 11.5–15.5)
RDW: 12.4 % (ref 11.5–15.5)
RDW: 12.5 % (ref 11.5–15.5)
WBC: 6.5 10*3/uL (ref 4.0–10.5)
WBC: 7.4 10*3/uL (ref 4.0–10.5)
WBC: 8.2 10*3/uL (ref 4.0–10.5)

## 2011-01-22 LAB — POCT I-STAT 3, ART BLOOD GAS (G3+)
Acid-Base Excess: 1 mmol/L (ref 0.0–2.0)
Acid-Base Excess: 2 mmol/L (ref 0.0–2.0)
Acid-base deficit: 2 mmol/L (ref 0.0–2.0)
Bicarbonate: 22.1 mEq/L (ref 20.0–24.0)
Bicarbonate: 23.3 mEq/L (ref 20.0–24.0)
Bicarbonate: 25.9 mEq/L — ABNORMAL HIGH (ref 20.0–24.0)
Bicarbonate: 26.9 mEq/L — ABNORMAL HIGH (ref 20.0–24.0)
O2 Saturation: 100 %
O2 Saturation: 100 %
O2 Saturation: 100 %
O2 Saturation: 93 %
TCO2: 23 mmol/L (ref 0–100)
TCO2: 26 mmol/L (ref 0–100)
TCO2: 27 mmol/L (ref 0–100)
TCO2: 28 mmol/L (ref 0–100)
pCO2 arterial: 36.3 mmHg (ref 35.0–45.0)
pCO2 arterial: 37.6 mmHg (ref 35.0–45.0)
pCO2 arterial: 39.8 mmHg (ref 35.0–45.0)
pCO2 arterial: 40.1 mmHg (ref 35.0–45.0)
pCO2 arterial: 45.6 mmHg — ABNORMAL HIGH (ref 35.0–45.0)
pH, Arterial: 7.374 (ref 7.350–7.450)
pH, Arterial: 7.386 (ref 7.350–7.450)
pH, Arterial: 7.419 (ref 7.350–7.450)
pO2, Arterial: 324 mmHg — ABNORMAL HIGH (ref 80.0–100.0)
pO2, Arterial: 62 mmHg — ABNORMAL LOW (ref 80.0–100.0)
pO2, Arterial: 87 mmHg (ref 80.0–100.0)

## 2011-01-22 LAB — POCT I-STAT, CHEM 8
BUN: 11 mg/dL (ref 6–23)
Calcium, Ion: 1.1 mmol/L — ABNORMAL LOW (ref 1.12–1.32)
Creatinine, Ser: 0.8 mg/dL (ref 0.4–1.5)
Creatinine, Ser: 0.9 mg/dL (ref 0.4–1.5)
Glucose, Bld: 132 mg/dL — ABNORMAL HIGH (ref 70–99)
HCT: 32 % — ABNORMAL LOW (ref 39.0–52.0)
Hemoglobin: 10.9 g/dL — ABNORMAL LOW (ref 13.0–17.0)
Hemoglobin: 9.9 g/dL — ABNORMAL LOW (ref 13.0–17.0)
Potassium: 4.3 mEq/L (ref 3.5–5.1)
Sodium: 140 mEq/L (ref 135–145)
TCO2: 22 mmol/L (ref 0–100)
TCO2: 23 mmol/L (ref 0–100)

## 2011-01-22 LAB — BASIC METABOLIC PANEL
BUN: 10 mg/dL (ref 6–23)
BUN: 11 mg/dL (ref 6–23)
BUN: 12 mg/dL (ref 6–23)
CO2: 24 mEq/L (ref 19–32)
CO2: 27 mEq/L (ref 19–32)
CO2: 28 mEq/L (ref 19–32)
CO2: 28 mEq/L (ref 19–32)
Calcium: 7.5 mg/dL — ABNORMAL LOW (ref 8.4–10.5)
Calcium: 8 mg/dL — ABNORMAL LOW (ref 8.4–10.5)
Calcium: 8.5 mg/dL (ref 8.4–10.5)
Calcium: 8.6 mg/dL (ref 8.4–10.5)
Calcium: 8.8 mg/dL (ref 8.4–10.5)
Chloride: 104 mEq/L (ref 96–112)
Chloride: 106 mEq/L (ref 96–112)
Chloride: 108 mEq/L (ref 96–112)
Chloride: 110 mEq/L (ref 96–112)
Creatinine, Ser: 0.9 mg/dL (ref 0.4–1.5)
Creatinine, Ser: 1.01 mg/dL (ref 0.4–1.5)
Creatinine, Ser: 1.02 mg/dL (ref 0.4–1.5)
GFR calc Af Amer: >60 mL/min (ref >60)
GFR calc Af Amer: >60 mL/min (ref >60)
GFR calc Af Amer: >60 mL/min (ref >60)
GFR calc Af Amer: >60 mL/min (ref >60)
GFR calc non Af Amer: >60 mL/min (ref >60)
GFR calc non Af Amer: >60 mL/min (ref >60)
GFR calc non Af Amer: >60 mL/min (ref >60)
GFR calc non Af Amer: >60 mL/min (ref >60)
GFR calc non Af Amer: >60 mL/min (ref >60)
Glucose, Bld: 102 mg/dL — ABNORMAL HIGH (ref 70–99)
Glucose, Bld: 111 mg/dL — ABNORMAL HIGH (ref 70–99)
Glucose, Bld: 115 mg/dL — ABNORMAL HIGH (ref 70–99)
Glucose, Bld: 135 mg/dL — ABNORMAL HIGH (ref 70–99)
Glucose, Bld: 81 mg/dL (ref 70–99)
Potassium: 3.3 mEq/L — ABNORMAL LOW (ref 3.5–5.1)
Potassium: 3.5 mEq/L (ref 3.5–5.1)
Potassium: 3.8 mEq/L (ref 3.5–5.1)
Potassium: 3.9 mEq/L (ref 3.5–5.1)
Sodium: 137 mEq/L (ref 135–145)
Sodium: 138 mEq/L (ref 135–145)
Sodium: 141 mEq/L (ref 135–145)
Sodium: 142 mEq/L (ref 135–145)
Sodium: 143 mEq/L (ref 135–145)

## 2011-01-22 LAB — PROTIME-INR
INR: 1.07 (ref 0.00–1.49)
INR: 1.08 (ref 0.00–1.49)
INR: 1.48 (ref 0.00–1.49)
Prothrombin Time: 17.8 seconds — ABNORMAL HIGH (ref 11.6–15.2)

## 2011-01-22 LAB — URINALYSIS, ROUTINE W REFLEX MICROSCOPIC
Hgb urine dipstick: NEGATIVE
Nitrite: NEGATIVE
Protein, ur: NEGATIVE mg/dL
Urobilinogen, UA: 0.2 mg/dL (ref 0.0–1.0)

## 2011-01-22 LAB — COMPREHENSIVE METABOLIC PANEL
AST: 27 U/L (ref 0–37)
Albumin: 3.2 g/dL — ABNORMAL LOW (ref 3.5–5.2)
Chloride: 108 mEq/L (ref 96–112)
Creatinine, Ser: 0.86 mg/dL (ref 0.4–1.5)
GFR calc Af Amer: >60 mL/min (ref >60)
Sodium: 140 mEq/L (ref 135–145)
Total Bilirubin: 2 mg/dL — ABNORMAL HIGH (ref 0.3–1.2)

## 2011-01-22 LAB — POCT I-STAT 3, VENOUS BLOOD GAS (G3P V)
Bicarbonate: 25 mEq/L — ABNORMAL HIGH (ref 20.0–24.0)
O2 Saturation: 84 %
TCO2: 26 mmol/L (ref 0–100)
pCO2, Ven: 41.4 mmHg — ABNORMAL LOW (ref 45.0–50.0)
pH, Ven: 7.389 — ABNORMAL HIGH (ref 7.250–7.300)
pO2, Ven: 49 mmHg — ABNORMAL HIGH (ref 30.0–45.0)

## 2011-01-22 LAB — HEMOGLOBIN AND HEMATOCRIT, BLOOD
HCT: 30.6 % — ABNORMAL LOW (ref 39.0–52.0)
Hemoglobin: 10.6 g/dL — ABNORMAL LOW (ref 13.0–17.0)

## 2011-01-22 LAB — CARDIAC PANEL(CRET KIN+CKTOT+MB+TROPI)
CK, MB: 5 ng/mL — ABNORMAL HIGH (ref 0.3–4.0)
CK, MB: 7.6 ng/mL (ref 0.3–4.0)
Relative Index: 3.6 — ABNORMAL HIGH (ref 0.0–2.5)
Relative Index: 5.2 — ABNORMAL HIGH (ref 0.0–2.5)
Relative Index: 5.7 — ABNORMAL HIGH (ref 0.0–2.5)
Troponin I: 0.27 ng/mL — ABNORMAL HIGH (ref 0.00–0.06)
Troponin I: 0.72 ng/mL (ref 0.00–0.06)

## 2011-01-22 LAB — TYPE AND SCREEN
ABO/RH(D): A POS
Antibody Screen: NEGATIVE

## 2011-01-22 LAB — GLUCOSE, CAPILLARY
Glucose-Capillary: 105 mg/dL — ABNORMAL HIGH (ref 70–99)
Glucose-Capillary: 119 mg/dL — ABNORMAL HIGH (ref 70–99)
Glucose-Capillary: 128 mg/dL — ABNORMAL HIGH (ref 70–99)
Glucose-Capillary: 131 mg/dL — ABNORMAL HIGH (ref 70–99)
Glucose-Capillary: 98 mg/dL (ref 70–99)

## 2011-01-22 LAB — PHOSPHORUS
Phosphorus: 3 mg/dL (ref 2.3–4.6)
Phosphorus: 3.9 mg/dL (ref 2.3–4.6)

## 2011-01-22 LAB — LIPID PANEL
HDL: 27 mg/dL — ABNORMAL LOW (ref >39)
LDL Cholesterol: 102 mg/dL — ABNORMAL HIGH (ref 0–99)
Triglycerides: 180 mg/dL — ABNORMAL HIGH (ref <150)

## 2011-01-22 LAB — CK TOTAL AND CKMB (NOT AT ARMC)
CK, MB: 4.3 ng/mL — ABNORMAL HIGH (ref 0.3–4.0)
Relative Index: 2.9 — ABNORMAL HIGH (ref 0.0–2.5)

## 2011-01-22 LAB — POCT CARDIAC MARKERS
CKMB, poc: 1.8 ng/mL (ref 1.0–8.0)
Myoglobin, poc: 137 ng/mL (ref 12–200)
Troponin i, poc: <0.05 ng/mL (ref 0.00–0.09)

## 2011-01-22 LAB — MAGNESIUM
Magnesium: 1.9 mg/dL (ref 1.5–2.5)
Magnesium: 2 mg/dL (ref 1.5–2.5)
Magnesium: 2 mg/dL (ref 1.5–2.5)
Magnesium: 2 mg/dL (ref 1.5–2.5)

## 2011-01-22 LAB — BLOOD GAS, ARTERIAL
Drawn by: 24487
TCO2: 21.2 mmol/L (ref 0–100)
pCO2 arterial: 35.6 mmHg (ref 35.0–45.0)
pH, Arterial: 7.366 (ref 7.350–7.450)

## 2011-01-22 LAB — MRSA PCR SCREENING: MRSA by PCR: NEGATIVE

## 2011-01-22 LAB — APTT
aPTT: 33 seconds (ref 24–37)
aPTT: 63 seconds — ABNORMAL HIGH (ref 24–37)

## 2011-01-22 LAB — TROPONIN I: Troponin I: 0.19 ng/mL — ABNORMAL HIGH (ref 0.00–0.06)

## 2011-01-22 LAB — CREATININE, SERUM
Creatinine, Ser: 0.91 mg/dL (ref 0.4–1.5)
GFR calc Af Amer: >60 mL/min (ref >60)
GFR calc non Af Amer: >60 mL/min (ref >60)

## 2011-03-18 NOTE — Assessment & Plan Note (Signed)
OFFICE VISIT   IASIAH, OZMENT  DOB:  1941/08/21                                        Mar 11, 2010  CHART #:  [id number]   HISTORY:  The patient returns for followup status post coronary artery  bypass grafting x2 on February 13, 2010.  He was last seen here in the  office by Dr. Donata Clay on March 01, 2010.  He had had a small hematoma  in his right thigh associated with his endoscopic vein harvest wound.  Since then, he has continued to do very well.  He is now up and  ambulating without any limitations.  He has no shortness of breath.  He  has been getting out and feeling well.  He still has some soreness in  his chest and he occasionally will take some oral narcotic pain  relievers.  He has not had any fevers or chills.  His appetite is good.  He has no other complaints.  He denies any tachy palpitations or dizzy  spells.  He has had his prothrombin time checked through Dr. Gibson Ramp  office and he remains on Coumadin.  His medications otherwise remain  unchanged from the time of hospital discharge.   PHYSICAL EXAMINATION:  Notable for well-appearing male with blood  pressure 116/73, pulse 57 regular, and oxygen saturation 98% on room  air.  Examination of the chest is notable for median sternotomy scar  that is healing nicely.  The sternum is stable on palpation.  Breath  sounds are clear to auscultation and symmetrical bilaterally.  No  wheezes, rales, or rhonchi are noted.  Cardiovascular exam demonstrates  regular rate rhythm.  No murmurs, rubs, or gallops are appreciated.  The  abdomen is soft and nontender.  The extremities are warm and well  perfused.  The small incision from endoscopic vein harvest has healed  nicely.  There is still an area where there is a palpable density just  above the incision consistent with a hematoma that is resolving.  There  is no fluctuance to suggest any unretained fluid and there is  specifically no erythema or  tenderness to suggest any ongoing infection.  There is no lower extremity edema.   DIAGNOSTIC TESTS:  Chest x-ray performed today at the [facility] is reviewed.  This demonstrates clear lung fields bilaterally  with trivial residual left pleural effusion.  No other abnormalities are  noted.   IMPRESSION:  The patient is doing very well.  His right thigh hematoma  appears to be resolving.   PLAN:  I have encouraged the patient to continue to increase his  physical activity as tolerated with his only limitation at this point  remaining that he refrain from heavy lifting or strenuous use of his  arms or shoulders for at least another 2-3 months.  I have encouraged  him get started in a cardiac rehab program.  I have instructed him to  cut his dose of amiodarone down to 200 mg daily.  When the current  prescription runs out, he should stop it altogether.  If he remains in  sinus rhythm off amiodarone, I think he could certainly come off  Coumadin within 2 months or so.  All of his questions have been  addressed.  We will plan to see him back in 3 months.  Salvatore Decent. Cornelius Moras, M.D.  Electronically Signed   CHO/MEDQ  D:  03/11/2010  T:  03/12/2010  Job:  [id number]   cc:   Verne Carrow, MD

## 2011-03-18 NOTE — Assessment & Plan Note (Signed)
OFFICE VISIT   WOODROE, VOGAN  DOB:  04-19-1941                                        February 25, 2010  CHART #:  [id number]   HISTORY:  The patient comes in today at the request of home health nurse  for wound check.  He is status post coronary artery bypass grafting x2  on February 13, 2010.  His postoperative course was complicated by atrial  fibrillation, for which she was treated with amiodarone, Lopressor, and  Coumadin, also a large left pleural effusion which requiring  thoracentesis.  He had been doing well since his discharge home on April  20 until about 48 hours ago.  At that time, he developed some erythema  and swelling around his right thigh EVH site.  The area has been mildly  tender, particularly right around the incision itself.  He spoke with  Dr. Dorris Fetch, who was on-call over the weekend and a prescription for  Keflex was called in.  He has now taken 2 days' worth of medication and  does feel that some of the discomfort is better but when his home health  nurse visited today to draw his INR.  He was encouraged to make an  appointment to be seen today and have this area further evaluated.  He  denies any fevers, chills or drainage from the site.  Also since  returning home, he has done well in terms of ambulation and has had no  shortness of breath.  He continues to have hiccups, which prevented him  from sleeping last night and have been fairly persistent since his  surgery.   PHYSICAL EXAMINATION:  Vital Signs:  Blood pressure is 118/67, pulse is  62, respirations 18, O2 sat 97%, temperature of 97.9.  His sternal and  chest tube sites have all healed well.  His sternum is stable to  palpation.  Heart:  Regular rate and rhythm without murmurs, rubs or  gallops.  Lungs:  Clear with good equal breath sounds bilaterally.  Extremities:  Lower extremities show no evidence of edema.  His right  lower extremity EVH site has healed well.  There  is a 2.5 x 2.5 cm area  of swelling around the incision, which is mildly tender and appears to  be an old hematoma or fluid collection.  There is significant ecchymosis  of the thigh itself and some mild surrounding erythema.  After topically  anesthetizing the area, I was able to aspirate nearly 10 mL of old  bloody-appearing fluid.   ASSESSMENT AND PLAN:  The patient is overall doing well status post  coronary artery bypass grafting.  He does only has a large hematoma of  the right thigh, although this appears to be improved somewhat since the  area was aspirated.  There is also a possibility of an early infection  in this hematoma and we have elected to continue him on Keflex for the  full course.  We will also send fluid for culture and sensitivity and  contact the patient if any antibiotic changes are required.  Dr. Cornelius Moras  also saw the patient today and discussed the plan of care with him.  He  has an appointment to see Dr. Clifton James in the next week and will follow  up with [place] in 2 weeks as previously scheduled with a  chest x-ray.  He  will call in the interim if he experiences any further problems or has  questions.   Salvatore Decent. Cornelius Moras, M.D.  Electronically Signed   GC/MEDQ  D:  02/25/2010  T:  02/26/2010  Job:  [id number]   cc:   Verne Carrow, MD

## 2011-03-18 NOTE — Assessment & Plan Note (Signed)
OFFICE VISIT   WYNDELL, CARDIFF  DOB:  15-Sep-1941                                        June 10, 2010  CHART #:  [id number]   HISTORY:  The patient returns for further followup status post coronary  artery bypass grafting x2 on February 13, 2010.  He was last seen here in  the office on Mar 11, 2010.  Since then, he has done very well.  He is  now approaching the end of his cardiac rehab program and he has  progressed very nicely.  He underwent an outpatient followup Holter  monitor study and has now been taken off of Coumadin.  He is getting  along quite well and reports no problems at all.  He has no significant  residual soreness in his chest.  He has no shortness of breath.  He has  no tachy palpitations.  He has no chest pain.  The remainder of his  review of systems is unremarkable.  The remainder of his past medical  history is unchanged.   CURRENT MEDICATIONS:  Aspirin, Crestor, multivitamin, Nexium, vitamin  B12, and Lopressor.   PHYSICAL EXAMINATION:  Notable for a well-appearing male with blood  pressure 126/77, pulse 58 regular, and oxygen saturation 98% on room  air.  Examination of the chest is notable for median sternotomy incision  that has healed completely.  The sternum is stable on palpation.  Auscultation of the chest reveals clear breath sounds that are  symmetrical bilaterally.  No wheezes, rales, or rhonchi are noted.  Cardiovascular exam includes regular rate and rhythm.  No murmurs, rubs,  or gallops are appreciated.  The abdomen is soft and nontender.  The  small incision in the right thigh from endoscopic vein harvest has  healed nicely and the previous hematoma has resolved.  There is no lower  extremity edema.  No other abnormalities are noted.   IMPRESSION:  The patient is doing very well.   PLAN:  In the future, the patient will return to see [place] here, [facility], only should further problems or  difficulties arise.  All of his questions have been addressed.   Salvatore Decent. Cornelius Moras, M.D.  Electronically Signed   CHO/MEDQ  D:  06/10/2010  T:  06/11/2010  Job:  [id number]   cc:   Verne Carrow, MD

## 2011-03-18 NOTE — Assessment & Plan Note (Signed)
OFFICE VISIT   Ruben Burton, Ruben Burton  DOB:  [birth year]                                        March 01, 2010  CHART #:  [id number]   CURRENT PROBLEMS:  1. Status post coronary artery bypass graft x2 by Dr. Cornelius Moras, February 13, 2010.  2. Right thigh hematoma.  3. Postoperative Coumadin therapy for perioperative atrial      arrhythmias.   PRESENT ILLNESS:  The patient presents to the office for a wound check.  He had his thigh aspirated earlier this week on Monday.  Blood was  withdrawn.  Cultures which are negative.  He has finished a course of  oral Keflex.  He knows within 48-72 hours that there is reaccumulation  of the swelling in his thigh.  There is no redness, fever, or drainage.  The chest incision is healing well  and he has remained in sinus rhythm.   PHYSICAL EXAMINATION:  Temperature 97.2, blood pressure 119/66, pulse 90  and regular.  The right thigh is nontender, but there is a fluctuant  area just above the surgical incision.  This is prepped, anesthetized,  and aspirated of 7 mL of dark venous blood.  A compressive dressing with  Ace wrap was applied from below-the-knee to above-the-knee at the mid  thigh.  He will keep this on for the next 36 hours.   PLAN:  The patient will return for his office visit as previously  scheduled and will continue his current medications.  I did provide him  with a  new prescription for OxyIR 5 mg 1 p.o. b.i.d. p.r.n.   Kerin Perna, M.D.  Electronically Signed   PV/MEDQ  D:  03/01/2010  T:  03/01/2010  Job:  [id number]

## 2011-03-21 NOTE — Procedures (Signed)
   NAMESHAMUS, DESANTIS                            ACCOUNT NO.:  [id number]   MEDICAL RECORD NO.:  [id number]                  PATIENT TYPE:  OUT   LOCATION:                                       FACILITY:   PHYSICIAN:  Kingsley Callander. Ouida Sills, M.D.                  DATE OF BIRTH:   DATE OF PROCEDURE:  DATE OF DISCHARGE:                                    STRESS TEST   PROCEDURE:  Exercise stress test   DESCRIPTION OF PROCEDURE:  This patient exercised 12 minutes 41seconds (41  seconds into stage 5 of the Bruce Protocol) attaining a maximal heart rate  of 175 (111% of the age-predicted maximum heart rate) at a work load of 12.8  METS.  He discontinued exercise due to fatigue.  There were no symptoms of  chest pain.  There were occasional ventricular premature complexes.  There  were no ST segment changes diagnostic of ischemia.  Baseline  electrocardiogram revealed normal sinus rhythm at 63 beats/minute.  His  antihypertensive Ziac was held the morning of the test.   IMPRESSION:  1. No evidence of exercise induced ischemia.  2. Good exercise tolerance.      ___________________________________________                                            Kingsley Callander. Ouida Sills, M.D.   ROF/MEDQ  D:  07/26/2003  T:  07/26/2003  Job:  [id number]

## 2011-03-21 NOTE — Op Note (Signed)
NAME:  Ruben Burton, Ruben Burton                          ACCOUNT NO.:  [id number]   MEDICAL RECORD NO.:  [id number]                   PATIENT TYPE:  AMB   LOCATION:  DAY                                  FACILITY:  [facility]   PHYSICIAN:  Lionel December, M.D.                 DATE OF BIRTH:  05/11/1941   DATE OF PROCEDURE:  01/30/2003  DATE OF DISCHARGE:                                 OPERATIVE REPORT   PROCEDURE:  Esophagogastroduodenoscopy followed by total colonoscopy.   ENDOSCOPIST:  Lionel December, M.D.   INDICATIONS:  Ruben Burton is a 70 year old Caucasian male with history of  chronic GERD who has been maintained on TPI with recurrent epigastric pain.  He also has intermittent solid dysphagia.  He will undergo EGD with EG  dilatation followed by a screening colonoscopy.  The procedure and risks  were reviewed with the patient and informed consent was obtained.   PREOPERATIVE MEDICATIONS:  Cetacaine spray for oropharyngeal topical  anesthesia, Demerol 50 mg IV and Versed 7 mg IV in divided dose.   INSTRUMENT:  Olympus video system.   FINDINGS:  Procedure performed in endoscopy suite.  The patient's vital  signs and O2 saturation were monitored during the procedure and remained  stable.   PROCEDURE #1: ESOPHAGOGASTRODUODENOSCOPY:  The patient was placed in the  left lateral recumbent position and endoscope was passed via the oropharynx  without any difficulty into the esophagus.   ESOPHAGUS:  Mucosa of the esophagus was normal.  He had noncritical ring at  the GE junction with serrate GE junction.  There was a focal area of gastric-  type mucosa very suspicious for Barrett's esophagus; however, this was very  tiny.  It was well-documented on endoscopic pictures.  There was a moderate  sized sliding hiatal hernia.   STOMACH:  It was empty and distended very well with insufflation.  The folds  of the proximal stomach were normal.  Examination of the mucosa revealed 2  small polyps at the  gastric body and these were ablated by a cold biopsy.  The rest of the mucosa was normal.  The pyloric channel was also normal.  The angularis and fundus and cardia were examined by retroflexing the scope.  Hernia was easily seen on this view.   DUODENUM:  Examination of the bulb, second and third part of the duodenum  was normal.   Endoscope was withdrawn.  The esophagus was dilated by passing 56 [place]  Maloney dilator.  Endoscope was passed again and esophagus reexamined.  There was no mucosal injury.  The patient was prepared for procedure #2.   COLONOSCOPY:  Rectal examination was performed.  This was within normal  limits.   The scope was placed in the rectum and advanced under vision into the  sigmoid colon and beyond.  Preparation was satisfactory.  Scope was passed  to the cecum which was identified by  appendiceal stump.  There was a small  polyp next to it which was ablated by cold biopsy.  Ileocecal valve was also  identified and photographed for the record.  As the scope was withdrawn the  colonic mucosa was once again carefully examined.  There was noted a small  polyp of the rectum which was ablated by cold biopsy.  The scope was  retroflexed to examine the anorectal junction which was unremarkable.  The  endoscope was straightened and withdrawn.  The patient tolerated the  procedures well.   FINAL DIAGNOSES:  1. 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  See above description.  2. Moderate sized sliding hiatal hernia.  3. Esophagus dilated by passing 56 [place] Maloney dilator.  4. Two small gastric polyps ablated by cold biopsy.  5. Two small polyps ablated by cold biopsy from the colon.  One was at the     cecum and another one at rectum.  The rest of the exam was normal.   RECOMMENDATIONS:  1. He will continue antireflux measures.  He will go back on Prevacid 30 mg     p.o. q.a.m.  He could not tell much between it and the  Nexium.  2. H. pylori serology will be checked today. I will contact the patient with     biopsy results and further recommendations.                                               Lionel December, M.D.    NR/MEDQ  D:  01/30/2003  T:  01/30/2003  Job:  [id number]   cc:   Kingsley Callander. Ouida Sills, M.D.  [address]  [place]  [place] [address]  Fax: [phone]

## 2011-04-25 ENCOUNTER — Encounter: Payer: Self-pay | Admitting: Cardiovascular Disease

## 2011-05-08 ENCOUNTER — Encounter: Payer: Self-pay | Admitting: Cardiovascular Disease

## 2011-05-09 ENCOUNTER — Encounter: Payer: Self-pay | Admitting: Cardiovascular Disease

## 2011-05-09 ENCOUNTER — Ambulatory Visit (INDEPENDENT_AMBULATORY_CARE_PROVIDER_SITE_OTHER): Payer: Medicare Other | Admitting: Cardiovascular Disease

## 2011-05-09 VITALS — BP 121/75 | HR 56 | Resp 12 | Ht 73.0 in | Wt 189.0 lb

## 2011-05-09 DIAGNOSIS — I10 Essential (primary) hypertension: Secondary | ICD-10-CM

## 2011-05-09 MED ORDER — METOPROLOL TARTRATE 25 MG PO TABS: ORAL_TABLET | ORAL | Status: AC

## 2011-05-09 NOTE — Assessment & Plan Note (Signed)
Stable. Last LDL 55 this summer.

## 2011-05-09 NOTE — Assessment & Plan Note (Signed)
Stable No changes 

## 2011-05-09 NOTE — Progress Notes (Signed)
History of Present Illness:70 yo WM with history of HTN, hyperlipidemia and diagnosis of CAD with admission to [facility] 02/07/10 with c/o chest pain, found to have NSTEMI. Cath on 02/09/10 with severe stenosis of mid LAD at bifurcation of large diagonal branch which I was unable to cross with a wire. He then underwent 2V CABG by Dr. Cornelius Moras on 02/13/10. Post-operative course complicated by post-op anemia and atrial fibrillation.  I last saw him in January  2012.  I had him wear a 21 day event monitor in August 2011 and there was no evidence of recurrence of atrial fibrillation. His coumadin was stopped.   He describes no chest pain, dizziness, syncope, SOB. He has been doing well.   Primary care is Shary Decamp.   Past Medical History  Diagnosis Date  . Paroxysmal atrial fibrillation   . Pleural effusion   . Coronary artery disease   . Hyperlipidemia   . Hypertension   . GERD (gastroesophageal reflux disease)     Past Surgical History  Procedure Date  . 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 Cholecystectomy   . Exploratory laparotomy     for perforated duodenal ulcer    Current Outpatient Prescriptions  Medication Sig Dispense Refill  . aspirin 81 MG tablet Take 81 mg by mouth daily.        . B Complex-Folic Acid (B COMPLEX-VITAMIN B12 PO) Take 1 tablet by mouth daily.        Marland Kitchen esomeprazole (NEXIUM) 40 MG capsule Take 40 mg by mouth daily before breakfast.        . metoprolol (LOPRESSOR) 50 MG tablet 1/2 tab po bid      . rosuvastatin (CRESTOR) 40 MG tablet 1/2 tab po qd        Allergies  Allergen Reactions  . Oxytetracycline     History   Social History  . Marital Status: Married    Spouse Name: N/A    Number of Children: 2  . Years of Education: N/A   Occupational History  . retirede    Social History Main Topics  . Smoking status: Never Smoker   . Smokeless tobacco: Not on file  . Alcohol Use: No  . Drug Use: No    . Sexually Active: Not on file   Other Topics Concern  . Not on file   Social History Narrative  . No narrative on file    Family History  Problem Relation Age of Onset  . Other Mother     alive- healthy  . Stroke Father     deceased  . Other Brother     CABG    Review of Systems:  As stated in the HPI and otherwise negative.   BP 121/75  Pulse 56  Resp 12  Ht 6\' 1"  (1.854 m)  Wt 189 lb (85.73 kg)  BMI 24.94 kg/m2  Physical Examination: General: Well developed, well nourished, NAD HEENT: OP clear, mucus membranes moist SKIN: warm, dry. No rashes. Neuro: No focal deficits Musculoskeletal: Muscle strength 5/5 all ext Psychiatric: Mood and affect normal Neck: No JVD, no carotid bruits, no thyromegaly, no lymphadenopathy. Lungs:Clear bilaterally, no wheezes, rhonci, crackles Cardiovascular: Regular rate and rhythm. No murmurs, gallops or rubs. Abdomen:Soft. Bowel sounds present. Non-tender.  Extremities: No lower extremity edema. Pulses are 2 + in the bilateral DP/PT.

## 2011-05-09 NOTE — Patient Instructions (Signed)
Your physician recommends that you schedule a follow-up appointment in: 1 year  

## 2011-08-14 ENCOUNTER — Telehealth: Payer: Self-pay | Admitting: Cardiovascular Disease

## 2011-08-14 NOTE — Telephone Encounter (Signed)
Pt wife calling wanting to know if pt needs to have follow up to check and see if pt liver is good and check pt cholesterol levels. Please return pt wife to discuss further.

## 2011-08-14 NOTE — Telephone Encounter (Signed)
I spoke with the patient's wife. She states that Dr. Ronne Binning drew the patient's lipid/liver around June of this year and that results were sent to [place]. I explained I do not see this in the patient's chart, but the last readings are from 10/2010. The patient's wife was uncertain who was to follow the lipids/liver. I explained that if Dr. Ronne Binning is doing this, it is ok. She wasn't sure if the patient needed to be followed with him and how often did he need to be checked. I explained that lipids/livers are typically checked every 6 months to 1 year depending on the MD, as long as no changes to the medications have been made. I explained I would forward to Dr. Clifton James for his recommendations and that Dennie Bible would call them back with those. The patient's wife is agreeable.

## 2011-08-15 NOTE — Telephone Encounter (Signed)
Pat, Can we try to get the results of lipids and LFts from the primary care office and let pt know that we are getting results? Thanks, chris

## 2011-08-19 NOTE — Telephone Encounter (Signed)
Spoke with [facility] and they will fax results. Wife aware we will call her after reviewed by Dr.McAlahany.

## 2011-08-22 NOTE — Telephone Encounter (Signed)
Labs received from April 25, 2011 and have been scanned in and reviewed by Dr. Clifton James.  Labs OK and do not need to be repeated at this time.  Can be followed in primary care office. Called to give pt this information and left message to call back.

## 2011-08-28 NOTE — Telephone Encounter (Signed)
Spoke with wife and gave her information.

## 2011-11-10 DIAGNOSIS — Z0000 Encounter for general adult medical examination without abnormal findings: Secondary | ICD-10-CM | POA: Diagnosis not present

## 2011-11-10 DIAGNOSIS — Z125 Encounter for screening for malignant neoplasm of prostate: Secondary | ICD-10-CM | POA: Diagnosis not present

## 2011-11-10 DIAGNOSIS — Z23 Encounter for immunization: Secondary | ICD-10-CM | POA: Diagnosis not present

## 2011-11-10 DIAGNOSIS — E785 Hyperlipidemia, unspecified: Secondary | ICD-10-CM | POA: Diagnosis not present

## 2011-11-10 DIAGNOSIS — M899 Disorder of bone, unspecified: Secondary | ICD-10-CM | POA: Diagnosis not present

## 2011-11-10 DIAGNOSIS — I2510 Atherosclerotic heart disease of native coronary artery without angina pectoris: Secondary | ICD-10-CM | POA: Diagnosis not present

## 2011-11-17 DIAGNOSIS — I2510 Atherosclerotic heart disease of native coronary artery without angina pectoris: Secondary | ICD-10-CM | POA: Diagnosis not present

## 2011-11-17 DIAGNOSIS — E785 Hyperlipidemia, unspecified: Secondary | ICD-10-CM | POA: Diagnosis not present

## 2011-11-17 DIAGNOSIS — I10 Essential (primary) hypertension: Secondary | ICD-10-CM | POA: Diagnosis not present

## 2011-11-17 DIAGNOSIS — I4891 Unspecified atrial fibrillation: Secondary | ICD-10-CM | POA: Diagnosis not present

## 2012-04-05 DIAGNOSIS — D485 Neoplasm of uncertain behavior of skin: Secondary | ICD-10-CM | POA: Diagnosis not present

## 2012-04-05 DIAGNOSIS — Z85828 Personal history of other malignant neoplasm of skin: Secondary | ICD-10-CM | POA: Diagnosis not present

## 2012-05-19 DIAGNOSIS — E785 Hyperlipidemia, unspecified: Secondary | ICD-10-CM | POA: Diagnosis not present

## 2012-05-19 DIAGNOSIS — I10 Essential (primary) hypertension: Secondary | ICD-10-CM | POA: Diagnosis not present

## 2012-05-19 DIAGNOSIS — M949 Disorder of cartilage, unspecified: Secondary | ICD-10-CM | POA: Diagnosis not present

## 2012-05-19 DIAGNOSIS — M899 Disorder of bone, unspecified: Secondary | ICD-10-CM | POA: Diagnosis not present

## 2012-05-19 DIAGNOSIS — E538 Deficiency of other specified B group vitamins: Secondary | ICD-10-CM | POA: Diagnosis not present

## 2012-05-24 DIAGNOSIS — I10 Essential (primary) hypertension: Secondary | ICD-10-CM | POA: Diagnosis not present

## 2012-05-25 DIAGNOSIS — I4891 Unspecified atrial fibrillation: Secondary | ICD-10-CM | POA: Diagnosis not present

## 2012-05-25 DIAGNOSIS — I2510 Atherosclerotic heart disease of native coronary artery without angina pectoris: Secondary | ICD-10-CM | POA: Diagnosis not present

## 2012-05-25 DIAGNOSIS — E785 Hyperlipidemia, unspecified: Secondary | ICD-10-CM | POA: Diagnosis not present

## 2012-05-25 DIAGNOSIS — I10 Essential (primary) hypertension: Secondary | ICD-10-CM | POA: Diagnosis not present

## 2012-07-29 DIAGNOSIS — Z23 Encounter for immunization: Secondary | ICD-10-CM | POA: Diagnosis not present

## 2012-08-20 ENCOUNTER — Telehealth: Payer: Self-pay | Admitting: Cardiovascular Disease

## 2012-08-20 ENCOUNTER — Emergency Department (HOSPITAL_COMMUNITY)
Admission: EM | Admit: 2012-08-20 | Discharge: 2012-08-20 | Disposition: A | Discharge status: Home / Self Care | Payer: Medicare Other | Attending: Emergency Medicine | Admitting: Emergency Medicine

## 2012-08-20 ENCOUNTER — Encounter (HOSPITAL_COMMUNITY): Payer: Self-pay | Admitting: Emergency Medicine

## 2012-08-20 DIAGNOSIS — M79609 Pain in unspecified limb: Secondary | ICD-10-CM | POA: Insufficient documentation

## 2012-08-20 DIAGNOSIS — I10 Essential (primary) hypertension: Secondary | ICD-10-CM | POA: Diagnosis not present

## 2012-08-20 DIAGNOSIS — I25810 Atherosclerosis of coronary artery bypass graft(s) without angina pectoris: Secondary | ICD-10-CM | POA: Diagnosis not present

## 2012-08-20 DIAGNOSIS — I8390 Asymptomatic varicose veins of unspecified lower extremity: Secondary | ICD-10-CM | POA: Diagnosis not present

## 2012-08-20 DIAGNOSIS — M7989 Other specified soft tissue disorders: Secondary | ICD-10-CM | POA: Diagnosis not present

## 2012-08-20 DIAGNOSIS — I83893 Varicose veins of bilateral lower extremities with other complications: Secondary | ICD-10-CM | POA: Diagnosis not present

## 2012-08-20 MED ORDER — MORPHINE SULFATE 4 MG/ML IJ SOLN: 4.0000 mg | Freq: Once | INTRAMUSCULAR | Status: DC

## 2012-08-20 MED ORDER — SODIUM CHLORIDE 0.9 % IV SOLN: INTRAVENOUS | Status: DC

## 2012-08-20 NOTE — ED Notes (Signed)
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 He has a quarter size purple raised area and swelling to his calf to foot. Patient states he was concerned he may have a blood clot in his leg. He stated he has a history of Afib but has not been on blood thinners for a year. Left foot is equal as cool as his Right. Palpable pedal pulse.

## 2012-08-20 NOTE — ED Provider Notes (Signed)
History  This chart was scribed for Ward Givens, MD by Ladona Ridgel Day. This patient was seen in room TR08C/TR08C and the patient's care was started at 1539.   CSN: [id number]  Arrival date & time 08/20/12  1539   None     Chief Complaint  Patient presents with  . Leg Pain   The history is provided by the patient. No language interpreter was used.   Ruben Burton is a 71 y.o. male who presents to the Emergency Department complaining of constant superficial small bruise/swelling to his left medial calf which he noticed today about an hour ago. 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 He states he felt a sting, then it started to swell.  He states was concerned that maybe he had a blood clot since one of his close friends passed from a blood clot. He takes ASA 81 mg daily because he had open heart surgery 2 years ago, also has A-fib.    PCP Dr Thayer Headings   Past Medical History  Diagnosis Date  . Paroxysmal atrial fibrillation   . Pleural effusion   . Coronary artery disease   . Hyperlipidemia   . Hypertension   . GERD (gastroesophageal reflux disease)     Past Surgical History  Procedure Date  . 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 Cholecystectomy   . Exploratory laparotomy     for perforated duodenal ulcer    Family History  Problem Relation Age of Onset  . Other Mother     alive- healthy  . Stroke Father     deceased  . Other Brother     CABG    History  Substance Use Topics  . Smoking status: Never Smoker   . Smokeless tobacco: Not on file  . Alcohol Use: No   Lives at home Lives with spouse   Review of Systems  Constitutional: Negative for fever and chills.  Respiratory: Negative for shortness of breath.   Gastrointestinal: Negative for nausea and vomiting.  Neurological: Negative for weakness.  All  other systems reviewed and are negative.    Allergies  Oxytetracycline  Home Medications   Current Outpatient Rx  Name Route Sig Dispense Refill  . ASPIRIN EC 81 MG PO TBEC Oral Take 81 mg by mouth daily.    . B COMPLEX-VITAMIN B12 PO Oral Take 1 tablet by mouth daily.      Marland Kitchen CALCIUM CARBONATE 600 MG PO TABS Oral Take 600 mg by mouth 2 (two) times daily with a meal.    . ESOMEPRAZOLE MAGNESIUM 40 MG PO CPDR Oral Take 40 mg by mouth daily before breakfast.      . METOPROLOL TARTRATE 25 MG PO TABS  Take one tablet by mouth twice daily. 180 tablet 3  . ROSUVASTATIN CALCIUM 40 MG PO TABS  1/2 tab po qd      Triage vitals: BP 150/60  Pulse 86  Resp 20  Ht 6\' 1"  (1.854 m)  Wt 185 lb (83.915 kg)  BMI 24.41 kg/m2  SpO2 96%  Vital signs normal    Physical Exam  Nursing note and vitals reviewed. Constitutional: He is oriented to person, place, and time. He appears well-developed and well-nourished.  Non-toxic appearance. He does not appear ill. No distress.  HENT:  Head: Normocephalic and atraumatic.  Nose: No mucosal edema or rhinorrhea.  Mouth/Throat: Mucous membranes are normal. No dental abscesses or uvula swelling.  Eyes: Conjunctivae normal and EOM are normal. Pupils are equal, round, and reactive to light.  Neck: Normal range of motion and full passive range of motion without pain. Neck supple.  Pulmonary/Chest: Effort normal. No respiratory distress. He has no rhonchi. He exhibits no crepitus.  Abdominal: Normal appearance.  Musculoskeletal: Normal range of motion. He exhibits no edema and no tenderness.       Moves all extremities well.   Neurological: He is alert and oriented to person, place, and time. He has normal strength. No cranial nerve deficit.  Skin: Skin is warm, dry and intact. No rash noted. No erythema. No pallor.       Small scattered sup varicoces both legs 2x2 cm area of swelling  palpated in subcutaneous tissue on med lLLE w/bruising evident under skin    Psychiatric: He has a normal mood and affect. His speech is normal and behavior is normal. His mood appears not anxious.    ED Course  Procedures (including critical care time)  Pt appears to have ruptured a very tiny superficial varicosity that has bled under the skin.   DIAGNOSTIC STUDIES: Oxygen Saturation is 96% on room air, adequate by my interpretation.    COORDINATION OF CARE   Discussed home care.     Date: 08/20/2012  Rate: 86  Rhythm: normal sinus rhythm and premature ventricular contractions (PVC)  QRS Axis: normal  Intervals: QT prolonged  ST/T Wave abnormalities: normal  Conduction Disutrbances:none  Narrative Interpretation:   Old EKG Reviewed: unchanged from 02/18/2010 except PVC's    1. Superficial varicosities    Plan discharge  Devoria Albe, MD, FACEP   MDM  I personally performed the services described in this documentation, which was scribed in my presence. The recorded information has been reviewed and considered.  Devoria Albe, MD, Armando Gang          Ward Givens, MD 08/20/12 [phone]

## 2012-08-20 NOTE — Telephone Encounter (Signed)
plz return call to patient wife [phone] Ruben Burton.  Pt  Has bulging vein in left lane, burning sensation. Pt has to see someone today.  plz call as soon as possible

## 2012-08-20 NOTE — ED Notes (Signed)
Patient c/o of leg pain that started about an hour ago. Patient denies remembering being bit by anything.  Patient has a small bruise to his left inner calf.  Left leg appears swollen.  Patient does not have any other complaints. CMS intact.

## 2012-08-20 NOTE — Progress Notes (Signed)
VASCULAR LAB PRELIMINARY  PRELIMINARY  PRELIMINARY  PRELIMINARY  Duplex ultrasound of left leg completed.    Preliminary report:  Left leg is negative for deep and superficial vein thrombosis.  The arterial flow in left leg is normal.  At the area of interest in the distal medial calf, there appears to be a hematoma.  No flow is noted within, around or outside of this area.  Ruben Burton, 08/20/2012, 7:24 PM

## 2012-08-20 NOTE — Telephone Encounter (Signed)
Spoke with wife who reports she has taken pt to ED at [facility] to have swelling of blood vessel in leg evaluated.

## 2012-08-27 ENCOUNTER — Telehealth: Payer: Self-pay | Admitting: Cardiovascular Disease

## 2012-08-27 NOTE — Telephone Encounter (Signed)
Patient's wife called because she is concern because pt had a ruptured  tiny superficial varicosity that has bled under the skin. Pt was seen in the ER on 08/20/12. A Duplex ultrasound was negative for deep and superficial vein thrombosis. On D/C pt was instructed to keep ice packs and elevate the leg on a pillow. Pt's wife states that pt have been having some swelling and discoloration around ankle and they expect that because the doctor in the ER said that the blood would go down to his ankle. Wife states the swelling was better in the meddle of the week so he was up on his feet more, and now there is more discoloration and swelling around the ankle and some soreness, Pt is just afraid that he could have a clot. I asked wife to check circulation on pt's foot by pressing her finger against toe, which wife said   has good capillary refill. Wife was made aware that would be some soreness when there is some blood accumulation under the skin. Pt will keep leg up on a pillow to see if swelling gets better. Pt To call the office on Monday if needed. Pt's wife verbalized understanding.

## 2012-08-27 NOTE — Telephone Encounter (Signed)
plz return call to pt wife [phone] regarding pt medical care.

## 2012-08-30 ENCOUNTER — Telehealth: Payer: Self-pay | Admitting: Cardiovascular Disease

## 2012-08-30 DIAGNOSIS — M79609 Pain in unspecified limb: Secondary | ICD-10-CM | POA: Diagnosis not present

## 2012-08-30 NOTE — Telephone Encounter (Signed)
Pt rtning call to Dr. Clifton James to tell him about pt leg trouble

## 2012-08-30 NOTE — Telephone Encounter (Signed)
Left message on pt's voicemail. He is asked to call us back in office today if his leg is not better. I could see him after my last patient tomorrow if necessary at 12:30 pm. chris

## 2012-08-30 NOTE — Telephone Encounter (Signed)
Spoke to patient he stated he went to [facility] ER 08/20/12 with pain inside of left ankle.States ER Dr told him blood vessel ruptured.States a doppler was done and it was normal. States left ankle is blue today,he saw PCP Dr.McKenzie and was told to stay off leg,take aleve as needed.States he wanted Dr.McAlhany to know.Also when looking at patient's chart noticed he is past due for appointment with Dr.McAlhany.Message fowarded to Dr.McAlhany's nurse and Dr.McAlhany.

## 2012-08-30 NOTE — Telephone Encounter (Signed)
Noted  

## 2012-08-31 NOTE — Telephone Encounter (Signed)
Spoke with pt's wife who reports pt saw his primary MD yesterday--Dr. McKenzie-- and he felt leg was OK and would just take some time to heal.  Wife states pt does not need appt with Dr. McAlhany today.  He is due for 1 year follow up with Dr. McAlhany but wife would like to wait until January 2014 when she is due for year follow up for pt to be seen.  Appt made for pt to see Dr. McAlhany on December 02, 2012 at 10:15. I told wife to call if pt has problems before this appt.  

## 2012-08-31 NOTE — Telephone Encounter (Signed)
Spoke with pt's wife who reports pt saw his primary MD yesterday--Dr. Ronne Binning-- and he felt leg was OK and would just take some time to heal.  Wife states pt does not need appt with Dr. Clifton James today.  He is due for 1 year follow up with Dr. Clifton James but wife would like to wait until January 2014 when she is due for year follow up for pt to be seen.  Appt made for pt to see Dr. Clifton James on December 02, 2012 at 10:15. I told wife to call if pt has problems before this appt.

## 2012-09-01 ENCOUNTER — Telehealth: Payer: Self-pay | Admitting: Cardiovascular Disease

## 2012-09-01 NOTE — Telephone Encounter (Signed)
Spoke with wife and she stated patient had seen PCP on Monday. Since that visit the swelling has changed locations and gotten worse, does not go down at night.  Foot is tight and painful.  Per wife no redness in foot or shortness of breath Did advised to call PCP since he was the last one to evaluate and Dr Clifton James not in the office.  Will follow up with wife.   Called back and spoke with the wife and she had spoken with the nurse at Dr Dimas Millin office and she was told nurse would speak with MD and call her back.     Will call back if she doesn't here back from there office

## 2012-09-01 NOTE — Telephone Encounter (Signed)
New problem:    C/O left foot swelling.

## 2012-09-02 NOTE — Telephone Encounter (Signed)
This is Dr Gibson Ramp patient. I will forward to [facility].

## 2012-09-23 DIAGNOSIS — H52229 Regular astigmatism, unspecified eye: Secondary | ICD-10-CM | POA: Diagnosis not present

## 2012-09-23 DIAGNOSIS — H2510 Age-related nuclear cataract, unspecified eye: Secondary | ICD-10-CM | POA: Diagnosis not present

## 2012-09-23 DIAGNOSIS — H5200 Hypermetropia, unspecified eye: Secondary | ICD-10-CM | POA: Diagnosis not present

## 2012-09-23 DIAGNOSIS — H524 Presbyopia: Secondary | ICD-10-CM | POA: Diagnosis not present

## 2012-10-29 ENCOUNTER — Telehealth: Payer: Self-pay | Admitting: Cardiology

## 2012-10-29 NOTE — Telephone Encounter (Signed)
I spoke with pt and his wife. Pt experienced brief non radiating chest tightness on Christmas Day. He says there was a lot of company at his house that day. He felt tired. He also experienced this again yesterday but had been splitting wood for about 30 minutes. He felt that maybe the pain was from the wood splitting. He was also feeling tired then as well.  This chest tightness was not like the pain he had before his CABG in 02/2010  He has taken Tylenol ES and it has helped some. He was concerned.  Denies any edema, shortness of breath or radiating chest pain.  His nexium has helped his GERD & pt states he does not think it is his GERD.  appt made with Scott for 11/01/12. Instructed pt if pain became worse or symptomatic angina to call 911. Pt agrees with plan and reassurance given Mylo Red RN

## 2012-10-29 NOTE — Telephone Encounter (Signed)
plz return call to pt [phone] regarding chest discomfort. No active Chest pain  Or SOB at this time, but does not feel good. Previous open heart surgery 2 yrs ago.  Pt would like to come in today. Plz return call

## 2012-11-01 ENCOUNTER — Encounter: Payer: Self-pay | Admitting: Physician Assistant

## 2012-11-01 ENCOUNTER — Ambulatory Visit (INDEPENDENT_AMBULATORY_CARE_PROVIDER_SITE_OTHER): Payer: Medicare Other | Admitting: Physician Assistant

## 2012-11-01 VITALS — BP 128/92 | HR 65 | Ht 73.0 in | Wt 191.8 lb

## 2012-11-01 DIAGNOSIS — I2510 Atherosclerotic heart disease of native coronary artery without angina pectoris: Secondary | ICD-10-CM

## 2012-11-01 DIAGNOSIS — R079 Chest pain, unspecified: Secondary | ICD-10-CM

## 2012-11-01 DIAGNOSIS — I10 Essential (primary) hypertension: Secondary | ICD-10-CM

## 2012-11-01 DIAGNOSIS — E785 Hyperlipidemia, unspecified: Secondary | ICD-10-CM

## 2012-11-01 DIAGNOSIS — R002 Palpitations: Secondary | ICD-10-CM

## 2012-11-01 MED ORDER — NITROGLYCERIN 0.4 MG SL SUBL: 0.4000 mg | SUBLINGUAL_TABLET | SUBLINGUAL | Status: DC | PRN

## 2012-11-01 NOTE — Progress Notes (Signed)
[address] [place], [place]  [address] Phone: [phone], Fax:  [phone]  Date:  11/01/2012   Name:  Ruben Burton   DOB:  1941-07-27   MRN:  [id number]  PCP:  Thayer Headings, MD  Primary Cardiologist:  Dr. Verne Carrow  Primary Electrophysiologist:  None    History of Present Illness: Ruben Burton is a 71 y.o. male who returns for the evaluation of chest pain.  He has a history of CAD, HTN, HL. Patient was admitted 4/11 with a non-STEMI. He subsequently underwent CABG. Postop course was complicated by atrial fibrillation. Echo 4/11: EF 55-60%, mildly dilated RV. Followup with her monitor was negative for recurrent atrial fibrillation Coumadin was discontinued. He was last seen 05/2011.  Patient notes increased stress over the holidays. He had several family members visiting. He started to feel bad and has had some fatigue. He feels better since the holidays are over and his visitors have left. He does note substernal chest pain. It feels sore. This is usually brought on by activities such as cutting wood. It resolves with rest. He can also reproduce the pain sometimes with palpation. He denies associated radiation, nausea, diaphoresis or dyspnea. He denies syncope. He does note associated palpitations he describes as flutters. He denies any flutters at rest.  Labs (4/11):    Hgb 9.7, TSH 2.706 Labs (7/11):    K 4.2, creatinine 1.0, ALT 18, LDL 55  Wt Readings from Last 3 Encounters:  11/01/12 191 lb 12.8 oz (87 kg)  08/20/12 185 lb (83.915 kg)  05/09/11 189 lb (85.73 kg)     Past Medical History  Diagnosis Date  . Paroxysmal atrial fibrillation   . Pleural effusion   . Coronary artery disease   . Hyperlipidemia   . Hypertension   . GERD (gastroesophageal reflux disease)     Current Outpatient Prescriptions  Medication Sig Dispense Refill  . aspirin EC 81 MG tablet Take 81 mg by mouth daily.      . B Complex-Folic Acid (B  COMPLEX-VITAMIN B12 PO) Take 1 tablet by mouth daily.        . calcium carbonate (OS-CAL) 600 MG TABS Take 600 mg by mouth 2 (two) times daily with a meal.      . esomeprazole (NEXIUM) 40 MG capsule Take 40 mg by mouth daily before breakfast.        . metoprolol (LOPRESSOR) 25 MG tablet Take one tablet by mouth twice daily.  180 tablet  3  . rosuvastatin (CRESTOR) 40 MG tablet 1/2 tab po qd        Allergies: Allergies  Allergen Reactions  . Oxytetracycline Other (See Comments)    Passes out.    Social History:  The patient  reports that he has never smoked. He does not have any smokeless tobacco history on file. He reports that he does not drink alcohol or use illicit drugs.   ROS:  Please see the history of present illness.      All other systems reviewed and negative.   PHYSICAL EXAM: VS:  BP 128/92  Pulse 65  Ht 6\' 1"  (1.854 m)  Wt 191 lb 12.8 oz (87 kg)  BMI 25.30 kg/m2 Well nourished, well developed, in no acute distress HEENT: normal Neck: no JVD Vascular: No carotid bruits Endocrine: No thyromegaly Cardiac:  normal S1, S2; RRR; no murmur Lungs:  clear to auscultation bilaterally, no wheezing, rhonchi or rales Abd: soft, nontender, no hepatomegaly Ext: no  edema Skin: warm and dry Neuro:  CNs 2-12 intact, no focal abnormalities noted  EKG:  NSR, HR 62, normal axis, poor R wave progression, nonspecific ST-T wave changes, no significant change when compared to prior tracing     ASSESSMENT AND PLAN:  1. Chest Pain:   He has typical and atypical features. Chest discomfort is sometimes brought on by exertion. His symptoms are not like his previous angina. He has not tried any nitroglycerin. It has been 2 years since his bypass. He has not had any stress testing since that time. I will arrange an ETT-Myoview. I will also give him a prescription for when necessary nitroglycerin.  2. Coronary Artery Disease:   Continue aspirin and statin. Proceed with Myoview as noted.  3.  Hypertension:   Controlled. Continue current therapy.  4. Hyperlipidemia:   Managed by primary care.  5. Palpitations:   Proceed with Myoview as noted. He has followup next month with Dr. Verne Carrow. If his stress test is unremarkable and his palpitations continue, consider event monitor.  6. Disposition:   Keep followup appointment next month with Dr. Verne Carrow.    Signed, Tereso Newcomer, PA-C  10:11 AM 11/01/2012

## 2012-11-01 NOTE — Patient Instructions (Addendum)
START NITRO AS DIRECTED  Your physician has requested that you have en exercise stress myoview. For further information please visit [email or web address] Please follow instruction sheet, as given.  KEEP APPOINTMENT WITH DR. Clifton James FOR 12/02/12

## 2012-11-04 ENCOUNTER — Ambulatory Visit (HOSPITAL_COMMUNITY): Payer: Medicare Other | Attending: Cardiology | Admitting: Radiology

## 2012-11-04 VITALS — BP 117/84 | HR 70 | Ht 73.0 in | Wt 191.0 lb

## 2012-11-04 DIAGNOSIS — R079 Chest pain, unspecified: Secondary | ICD-10-CM | POA: Diagnosis not present

## 2012-11-04 DIAGNOSIS — I4949 Other premature depolarization: Secondary | ICD-10-CM

## 2012-11-04 DIAGNOSIS — I2510 Atherosclerotic heart disease of native coronary artery without angina pectoris: Secondary | ICD-10-CM | POA: Diagnosis not present

## 2012-11-04 DIAGNOSIS — R5381 Other malaise: Secondary | ICD-10-CM | POA: Diagnosis not present

## 2012-11-04 MED ORDER — TECHNETIUM TC 99M SESTAMIBI GENERIC - CARDIOLITE
11.0000 | Freq: Once | INTRAVENOUS | Status: AC | PRN
  Administered 2012-11-04: 11 via INTRAVENOUS

## 2012-11-04 MED ORDER — TECHNETIUM TC 99M SESTAMIBI GENERIC - CARDIOLITE
33.0000 | Freq: Once | INTRAVENOUS | Status: AC | PRN
  Administered 2012-11-04: 33 via INTRAVENOUS

## 2012-11-04 NOTE — Progress Notes (Signed)
[facility] SITE 3 NUCLEAR MED [address] [place], [place] [address] [phone]    Cardiology Nuclear Med Study  Ruben Burton is a 72 y.o. male     MRN : [id number]     DOB: 1941/08/16  Procedure Date: 11/04/2012  Nuclear Med Background Indication for Stress Test:  Evaluation for Ischemia and Graft Patency History:  PAF; ~20 yrs ago GXT:OK per patient; '11 NSTEMI>CABG; '11 Echo:EF=60% Cardiac Risk Factors: Family History - CAD, History of Smoking, Hypertension and Lipids  Symptoms:  Chest Tightness with and without Exertion (last episode of chest discomfort was yesterday, none today), Fatigue, Fatigue with Exertion and Palpitations with Chest Tightness   Nuclear Pre-Procedure Caffeine/Decaff Intake:  None NPO After: 8:30pm   Lungs:  Clear. O2 Sat: 98% on room air. IV 0.9% NS with Angio Cath:  20g  IV Site: R Antecubital  IV Started by:  Bonnita Levan, RN  Chest Size (in):  44 Cup Size: n/a  Height: 6\' 1"  (1.854 m)  Weight:  191 lb (86.637 kg)  BMI:  Body mass index is 25.20 kg/(m^2). Tech Comments:  N/A    Nuclear Med Study 1 or 2 day study: 1 day  Stress Test Type:  Stress  Reading MD: Cassell Clement, MD  Order Authorizing Provider:  Verne Carrow, MD  Resting Radionuclide: Technetium 45m Sestamibi  Resting Radionuclide Dose: 11.0 mCi   Stress Radionuclide:  Technetium 54m Sestamibi  Stress Radionuclide Dose: 33.0 mCi           Stress Protocol Rest HR: 70 Stress HR: 144  Rest BP: 117/84 Stress BP: 201/106  Exercise Time (min): 6:30 METS: 7.7   Predicted Max HR: 148 bpm % Max HR: 97.3 bpm Rate Pressure Product: 46962    Dose of Adenosine (mg):  n/a Dose of Lexiscan: n/a mg  Dose of Atropine (mg): n/a Dose of Dobutamine: n/a mcg/kg/min (at max HR)  Stress Test Technologist: Smiley Houseman, CMA-N  Nuclear Technologist:  Domenic Polite, CNMT     Rest Procedure:  Myocardial perfusion imaging was performed at rest 45 minutes following the  intravenous administration of Technetium 22m Sestamibi.  Rest ECG: NSR - Normal EKG  Stress Procedure:  The patient exercised on the treadmill utilizing the Bruce Protocol for 6:30 minutes. The patient stopped due to fatigue and denied any chest pain.  Technetium 53m Sestamibi was injected at peak exercise and myocardial perfusion imaging was performed after a brief delay.  Stress ECG: No significant change from baseline ECG  QPS Raw Data Images:  Normal; no motion artifact; normal heart/lung ratio. Stress Images:  Normal homogeneous uptake in all areas of the myocardium. Rest Images:  Normal homogeneous uptake in all areas of the myocardium. Subtraction (SDS):  No evidence of ischemia. Transient Ischemic Dilatation (Normal <1.22):  1.02 Lung/Heart Ratio (Normal <0.45):  0.25  Quantitative Gated Spect Images QGS EDV:  n/a ml QGS ESV:  n/a ml  Impression Exercise Capacity:  Good exercise capacity. BP Response:  Hypertensive blood pressure response. Clinical Symptoms:  No chest pain. ECG Impression:  No significant ST segment change suggestive of ischemia. Comparison with Prior Nuclear Study: No previous nuclear study performed  Overall Impression:  Low risk stress nuclear study. Good exercise tolerance.  No evidence of ischemia.  LV Ejection Fraction: Study not gated.  LV Wall Motion:  Because of frequent PVCs the study was non-gated.   Cassell Clement

## 2012-11-05 ENCOUNTER — Encounter: Payer: Self-pay | Admitting: Physician Assistant

## 2012-11-18 DIAGNOSIS — Z1331 Encounter for screening for depression: Secondary | ICD-10-CM | POA: Diagnosis not present

## 2012-11-18 DIAGNOSIS — I10 Essential (primary) hypertension: Secondary | ICD-10-CM | POA: Diagnosis not present

## 2012-11-18 DIAGNOSIS — Z125 Encounter for screening for malignant neoplasm of prostate: Secondary | ICD-10-CM | POA: Diagnosis not present

## 2012-11-18 DIAGNOSIS — E538 Deficiency of other specified B group vitamins: Secondary | ICD-10-CM | POA: Diagnosis not present

## 2012-11-18 DIAGNOSIS — E785 Hyperlipidemia, unspecified: Secondary | ICD-10-CM | POA: Diagnosis not present

## 2012-11-18 DIAGNOSIS — Z0000 Encounter for general adult medical examination without abnormal findings: Secondary | ICD-10-CM | POA: Diagnosis not present

## 2012-11-18 DIAGNOSIS — M899 Disorder of bone, unspecified: Secondary | ICD-10-CM | POA: Diagnosis not present

## 2012-11-25 DIAGNOSIS — I10 Essential (primary) hypertension: Secondary | ICD-10-CM | POA: Diagnosis not present

## 2012-11-25 DIAGNOSIS — E785 Hyperlipidemia, unspecified: Secondary | ICD-10-CM | POA: Diagnosis not present

## 2012-11-25 DIAGNOSIS — H6120 Impacted cerumen, unspecified ear: Secondary | ICD-10-CM | POA: Diagnosis not present

## 2012-11-25 DIAGNOSIS — I2510 Atherosclerotic heart disease of native coronary artery without angina pectoris: Secondary | ICD-10-CM | POA: Diagnosis not present

## 2012-11-25 DIAGNOSIS — I4891 Unspecified atrial fibrillation: Secondary | ICD-10-CM | POA: Diagnosis not present

## 2012-12-02 ENCOUNTER — Encounter: Payer: Self-pay | Admitting: Cardiovascular Disease

## 2012-12-02 ENCOUNTER — Ambulatory Visit (INDEPENDENT_AMBULATORY_CARE_PROVIDER_SITE_OTHER): Payer: Medicare Other | Admitting: Cardiovascular Disease

## 2012-12-02 VITALS — BP 127/75 | HR 61 | Ht 71.0 in | Wt 188.0 lb

## 2012-12-02 DIAGNOSIS — I25810 Atherosclerosis of coronary artery bypass graft(s) without angina pectoris: Secondary | ICD-10-CM | POA: Diagnosis not present

## 2012-12-02 NOTE — Progress Notes (Signed)
History of Present Illness: 72 yo male with history of CAD, HTN, HLD here today for follow up.  Patient was admitted 4/11 with a non-STEMI. He subsequently underwent CABG. Postop course was complicated by atrial fibrillation. Echo 4/11: EF 55-60%, mildly dilated RV. Followup with monitor was negative for recurrent atrial fibrillation Coumadin was discontinued. He was seen by Tereso Newcomer 11/01/12 for chest pain while cutting wood. Stress myoview 11/04/12 with no ischemia.   He is feeling better. No chest pain or SOB. He is active.   Primary Care Physician: Shary Decamp  Last Lipid Profile: January 2014: Total chol 112  HDL 32   LDL 61   Past Medical History  Diagnosis Date  . Paroxysmal atrial fibrillation   . Pleural effusion   . Coronary artery disease   . Hyperlipidemia   . Hypertension   . GERD (gastroesophageal reflux disease)   . Hx of cardiovascular stress test     a. ETT-MV 1/14: no ischemia; not gated    Past Surgical History  Procedure Date  . 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 Cholecystectomy   . Exploratory laparotomy     for perforated duodenal ulcer    Current Outpatient Prescriptions  Medication Sig Dispense Refill  . aspirin EC 81 MG tablet Take 81 mg by mouth daily.      . B Complex-Folic Acid (B COMPLEX-VITAMIN B12 PO) Take 1 tablet by mouth daily.        . calcium carbonate (OS-CAL) 600 MG TABS Take 600 mg by mouth 2 (two) times daily with a meal.      . esomeprazole (NEXIUM) 40 MG capsule Take 40 mg by mouth daily before breakfast.        . metoprolol (LOPRESSOR) 25 MG tablet Take one tablet by mouth twice daily.  180 tablet  3  . nitroGLYCERIN (NITROSTAT) 0.4 MG SL tablet Place 1 tablet (0.4 mg total) under the tongue every 5 (five) minutes as needed for chest pain.  25 tablet  11  . rosuvastatin (CRESTOR) 40 MG tablet 1/2 tab po qd        Allergies  Allergen Reactions  . Oxytetracycline Other (See  Comments)    Passes out.    History   Social History  . Marital Status: Married    Spouse Name: N/A    Number of Children: 2  . Years of Education: N/A   Occupational History  . retirede    Social History Main Topics  . Smoking status: Never Smoker   . Smokeless tobacco: Not on file  . Alcohol Use: No  . Drug Use: No  . Sexually Active: Not on file   Other Topics Concern  . Not on file   Social History Narrative  . No narrative on file    Family History  Problem Relation Age of Onset  . Other Mother     alive- healthy  . Stroke Father     deceased  . Other Brother     CABG    Review of Systems:  As stated in the HPI and otherwise negative.   BP 127/75  Pulse 61  Ht 5\' 11"  (1.803 m)  Wt 188 lb (85.276 kg)  BMI 26.22 kg/m2  Physical Examination: General: Well developed, well nourished, NAD HEENT: OP clear, mucus membranes moist SKIN: warm, dry. No rashes. Neuro: No focal deficits Musculoskeletal: Muscle strength 5/5 all ext Psychiatric: Mood and affect  normal Neck: No JVD, no carotid bruits, no thyromegaly, no lymphadenopathy. Lungs:Clear bilaterally, no wheezes, rhonci, crackles Cardiovascular: Regular rate and rhythm. No murmurs, gallops or rubs. Abdomen:Soft. Bowel sounds present. Non-tender.  Extremities: No lower extremity edema. Pulses are 2 + in the bilateral DP/PT.  Stress myoview 11/04/12:  Stress Procedure: The patient exercised on the treadmill utilizing the Bruce Protocol for 6:30 minutes. The patient stopped due to fatigue and denied any chest pain. Technetium 74m Sestamibi was injected at peak exercise and myocardial perfusion imaging was performed after a brief delay.  Stress ECG: No significant change from baseline ECG  QPS  Raw Data Images: Normal; no motion artifact; normal heart/lung ratio.  Stress Images: Normal homogeneous uptake in all areas of the myocardium.  Rest Images: Normal homogeneous uptake in all areas of the myocardium.    Subtraction (SDS): No evidence of ischemia.  Transient Ischemic Dilatation (Normal <1.22): 1.02  Lung/Heart Ratio (Normal <0.45): 0.25  Quantitative Gated Spect Images  QGS EDV: n/a ml  QGS ESV: n/a ml  Impression  Exercise Capacity: Good exercise capacity.  BP Response: Hypertensive blood pressure response.  Clinical Symptoms: No chest pain.  ECG Impression: No significant ST segment change suggestive of ischemia.  Comparison with Prior Nuclear Study: No previous nuclear study performed  Overall Impression: Low risk stress nuclear study. Good exercise tolerance. No evidence of ischemia.  LV Ejection Fraction: Study not gated. LV Wall Motion: Because of frequent PVCs the study was non-gated.   Assessment and Plan:   1. Coronary Artery Disease: Continue aspirin and statin. Myoview without ischemia.   2. Hypertension: Controlled. Continue current therapy.   3. Hyperlipidemia: Managed by primary care. Well controlled.

## 2012-12-02 NOTE — Patient Instructions (Addendum)
Your physician wants you to follow-up in:  6 months. You will receive a reminder letter in the mail two months in advance. If you don't receive a letter, please call our office to schedule the follow-up appointment.   

## 2012-12-22 DIAGNOSIS — D485 Neoplasm of uncertain behavior of skin: Secondary | ICD-10-CM | POA: Diagnosis not present

## 2012-12-22 DIAGNOSIS — L299 Pruritus, unspecified: Secondary | ICD-10-CM | POA: Diagnosis not present

## 2013-01-17 DIAGNOSIS — M2550 Pain in unspecified joint: Secondary | ICD-10-CM | POA: Diagnosis not present

## 2013-01-17 DIAGNOSIS — M109 Gout, unspecified: Secondary | ICD-10-CM | POA: Diagnosis not present

## 2013-05-02 DIAGNOSIS — M47814 Spondylosis without myelopathy or radiculopathy, thoracic region: Secondary | ICD-10-CM | POA: Diagnosis not present

## 2013-05-02 DIAGNOSIS — M549 Dorsalgia, unspecified: Secondary | ICD-10-CM | POA: Diagnosis not present

## 2013-05-25 DIAGNOSIS — M899 Disorder of bone, unspecified: Secondary | ICD-10-CM | POA: Diagnosis not present

## 2013-05-25 DIAGNOSIS — I10 Essential (primary) hypertension: Secondary | ICD-10-CM | POA: Diagnosis not present

## 2013-05-31 ENCOUNTER — Ambulatory Visit: Payer: Medicare Other | Admitting: Cardiovascular Disease

## 2013-06-01 DIAGNOSIS — I2510 Atherosclerotic heart disease of native coronary artery without angina pectoris: Secondary | ICD-10-CM | POA: Diagnosis not present

## 2013-06-01 DIAGNOSIS — E785 Hyperlipidemia, unspecified: Secondary | ICD-10-CM | POA: Diagnosis not present

## 2013-06-01 DIAGNOSIS — I10 Essential (primary) hypertension: Secondary | ICD-10-CM | POA: Diagnosis not present

## 2013-06-01 DIAGNOSIS — I4891 Unspecified atrial fibrillation: Secondary | ICD-10-CM | POA: Diagnosis not present

## 2013-08-02 ENCOUNTER — Ambulatory Visit (INDEPENDENT_AMBULATORY_CARE_PROVIDER_SITE_OTHER): Payer: Medicare Other | Admitting: Cardiovascular Disease

## 2013-08-02 ENCOUNTER — Encounter: Payer: Self-pay | Admitting: Cardiovascular Disease

## 2013-08-02 VITALS — BP 118/80 | HR 58 | Wt 191.0 lb

## 2013-08-02 DIAGNOSIS — I25810 Atherosclerosis of coronary artery bypass graft(s) without angina pectoris: Secondary | ICD-10-CM | POA: Diagnosis not present

## 2013-08-02 DIAGNOSIS — E785 Hyperlipidemia, unspecified: Secondary | ICD-10-CM

## 2013-08-02 NOTE — Progress Notes (Signed)
History of Present Illness: 72 yo male with history of CAD, HTN, HLD here today for follow up.  Patient was admitted 4/11 with a non-STEMI. He subsequently underwent CABG. Postop course was complicated by atrial fibrillation. Echo 4/11: EF 55-60%, mildly dilated RV. Followup with monitor was negative for recurrent atrial fibrillation.  Coumadin was discontinued. He was seen by Tereso Newcomer 11/01/12 for chest pain while cutting wood. Stress myoview 11/04/12 with no ischemia.   He is here today for follow up. No chest pain or SOB. He is active. He kayaks.   Primary Care Physician: Shary Decamp  Last Lipid Profile: Followed in primary care.   Past Medical History  Diagnosis Date  . Paroxysmal atrial fibrillation   . Pleural effusion   . Coronary artery disease   . Hyperlipidemia   . Hypertension   . GERD (gastroesophageal reflux disease)   . Hx of cardiovascular stress test     a. ETT-MV 1/14: no ischemia; not gated    Past Surgical History  Procedure Laterality Date  . Coronary artery bypass graft  02-13-10    2V (LIMA to LAD, SVG to diagonal)  . Cholecystectomy    . Exploratory laparotomy      for perforated duodenal ulcer    Current Outpatient Prescriptions  Medication Sig Dispense Refill  . aspirin EC 81 MG tablet Take 81 mg by mouth daily.      . B Complex-Folic Acid (B COMPLEX-VITAMIN B12 PO) Take 1 tablet by mouth daily.        . calcium carbonate (OS-CAL) 600 MG TABS Take 600 mg by mouth 2 (two) times daily with a meal.      . esomeprazole (NEXIUM) 40 MG capsule Take 40 mg by mouth daily before breakfast.        . metoprolol (LOPRESSOR) 25 MG tablet Take one tablet by mouth twice daily.  180 tablet  3  . nitroGLYCERIN (NITROSTAT) 0.4 MG SL tablet Place 1 tablet (0.4 mg total) under the tongue every 5 (five) minutes as needed for chest pain.  25 tablet  11  . rosuvastatin (CRESTOR) 20 MG tablet Take 20 mg by mouth daily.       No current facility-administered  medications for this visit.    Allergies  Allergen Reactions  . Oxytetracycline Other (See Comments)    Passes out.    History   Social History  . Marital Status: Married    Spouse Name: N/A    Number of Children: 2  . Years of Education: N/A   Occupational History  . retirede    Social History Main Topics  . Smoking status: Never Smoker   . Smokeless tobacco: Not on file  . Alcohol Use: No  . Drug Use: No  . Sexual Activity: Not on file   Other Topics Concern  . Not on file   Social History Narrative  . No narrative on file    Family History  Problem Relation Age of Onset  . Other Mother     alive- healthy  . Stroke Father     deceased  . Other Brother     CABG    Review of Systems:  As stated in the HPI and otherwise negative.   BP 118/80  Pulse 58  Wt 191 lb (86.637 kg)  BMI 26.65 kg/m2  Physical Examination: General: Well developed, well nourished, NAD HEENT: OP clear, mucus membranes moist SKIN: warm, dry. No rashes. Neuro: No focal deficits Musculoskeletal: Muscle  strength 5/5 all ext Psychiatric: Mood and affect normal Neck: No JVD, no carotid bruits, no thyromegaly, no lymphadenopathy. Lungs:Clear bilaterally, no wheezes, rhonci, crackles Cardiovascular: Regular rate and rhythm. No murmurs, gallops or rubs. Abdomen:Soft. Bowel sounds present. Non-tender.  Extremities: No lower extremity edema. Pulses are 2 + in the bilateral DP/PT.  Stress myoview 11/04/12:  Stress Procedure: The patient exercised on the treadmill utilizing the Bruce Protocol for 6:30 minutes. The patient stopped due to fatigue and denied any chest pain. Technetium 66m Sestamibi was injected at peak exercise and myocardial perfusion imaging was performed after a brief delay.  Stress ECG: No significant change from baseline ECG  QPS  Raw Data Images: Normal; no motion artifact; normal heart/lung ratio.  Stress Images: Normal homogeneous uptake in all areas of the myocardium.    Rest Images: Normal homogeneous uptake in all areas of the myocardium.  Subtraction (SDS): No evidence of ischemia.  Transient Ischemic Dilatation (Normal <1.22): 1.02  Lung/Heart Ratio (Normal <0.45): 0.25  Quantitative Gated Spect Images  QGS EDV: n/a ml  QGS ESV: n/a ml  Impression  Exercise Capacity: Good exercise capacity.  BP Response: Hypertensive blood pressure response.  Clinical Symptoms: No chest pain.  ECG Impression: No significant ST segment change suggestive of ischemia.  Comparison with Prior Nuclear Study: No previous nuclear study performed  Overall Impression: Low risk stress nuclear study. Good exercise tolerance. No evidence of ischemia.  LV Ejection Fraction: Study not gated. LV Wall Motion: Because of frequent PVCs the study was non-gated.   Assessment and Plan:   1. Coronary Artery Disease: Continue aspirin, beta blocker and statin. Myoview without ischemia.   2. Hypertension: Controlled. Continue current therapy.   3. Hyperlipidemia: Managed by primary care. Well controlled.

## 2013-08-02 NOTE — Patient Instructions (Addendum)
Your physician wants you to follow-up in:  6 months. You will receive a reminder letter in the mail two months in advance. If you don't receive a letter, please call our office to schedule the follow-up appointment.   

## 2013-08-03 DIAGNOSIS — Z23 Encounter for immunization: Secondary | ICD-10-CM | POA: Diagnosis not present

## 2013-09-07 ENCOUNTER — Encounter (INDEPENDENT_AMBULATORY_CARE_PROVIDER_SITE_OTHER): Payer: Self-pay | Admitting: *Deleted

## 2013-09-08 ENCOUNTER — Encounter (INDEPENDENT_AMBULATORY_CARE_PROVIDER_SITE_OTHER): Payer: Self-pay

## 2013-09-16 ENCOUNTER — Telehealth (INDEPENDENT_AMBULATORY_CARE_PROVIDER_SITE_OTHER): Payer: Self-pay | Admitting: *Deleted

## 2013-09-16 ENCOUNTER — Other Ambulatory Visit (INDEPENDENT_AMBULATORY_CARE_PROVIDER_SITE_OTHER): Payer: Self-pay | Admitting: *Deleted

## 2013-09-16 DIAGNOSIS — Z1211 Encounter for screening for malignant neoplasm of colon: Secondary | ICD-10-CM

## 2013-09-16 MED ORDER — PEG 3350-KCL-NA BICARB-NACL 420 G PO SOLR: 4000.0000 mL | Freq: Once | ORAL | Status: DC

## 2013-09-16 NOTE — Telephone Encounter (Signed)
Patient needs trilyte 

## 2013-09-26 DIAGNOSIS — H524 Presbyopia: Secondary | ICD-10-CM | POA: Diagnosis not present

## 2013-09-26 DIAGNOSIS — H5200 Hypermetropia, unspecified eye: Secondary | ICD-10-CM | POA: Diagnosis not present

## 2013-09-26 DIAGNOSIS — H2510 Age-related nuclear cataract, unspecified eye: Secondary | ICD-10-CM | POA: Diagnosis not present

## 2013-09-26 DIAGNOSIS — H52229 Regular astigmatism, unspecified eye: Secondary | ICD-10-CM | POA: Diagnosis not present

## 2013-11-02 ENCOUNTER — Telehealth (INDEPENDENT_AMBULATORY_CARE_PROVIDER_SITE_OTHER): Payer: Self-pay | Admitting: *Deleted

## 2013-11-02 NOTE — Telephone Encounter (Signed)
agree

## 2013-11-02 NOTE — Telephone Encounter (Signed)
  Procedure: tcs  Reason/Indication:  Hx polyps  Has patient had this procedure before?  Yes, 2009 -- scanned  If so, when, by whom and where?    Is there a family history of colon cancer?  no  Who?  What age when diagnosed?    Is patient diabetic?   no      Does patient have prosthetic heart valve?  no  Do you have a pacemaker?  no  Has patient ever had endocarditis? no  Has patient had joint replacement within last 12 months?  no  Does patient tend to be constipated or take laxatives? no  Is patient on Coumadin, Plavix and/or Aspirin? yes  Medications: asa 81 mg daily, vit b12 1000 mg daily, crestor 20 mg daily, nexium 40 mg daily, metoprolol 25 mg bid, nitro 0.4 mg prn, oscal chewable tablets  Allergies: see EPIC  Medication Adjustment: asa 2 days  Procedure date & time: 12/01/13 at 830

## 2013-11-17 ENCOUNTER — Encounter (HOSPITAL_COMMUNITY): Payer: Self-pay | Admitting: Pharmacy Technician

## 2013-11-23 ENCOUNTER — Encounter: Payer: Self-pay | Admitting: Cardiovascular Disease

## 2013-11-23 DIAGNOSIS — Z1331 Encounter for screening for depression: Secondary | ICD-10-CM | POA: Diagnosis not present

## 2013-11-23 DIAGNOSIS — Z125 Encounter for screening for malignant neoplasm of prostate: Secondary | ICD-10-CM | POA: Diagnosis not present

## 2013-11-23 DIAGNOSIS — E538 Deficiency of other specified B group vitamins: Secondary | ICD-10-CM | POA: Diagnosis not present

## 2013-11-23 DIAGNOSIS — Z0000 Encounter for general adult medical examination without abnormal findings: Secondary | ICD-10-CM | POA: Diagnosis not present

## 2013-11-23 DIAGNOSIS — M899 Disorder of bone, unspecified: Secondary | ICD-10-CM | POA: Diagnosis not present

## 2013-11-23 DIAGNOSIS — I4891 Unspecified atrial fibrillation: Secondary | ICD-10-CM | POA: Diagnosis not present

## 2013-11-23 DIAGNOSIS — E663 Overweight: Secondary | ICD-10-CM | POA: Diagnosis not present

## 2013-11-23 DIAGNOSIS — I2510 Atherosclerotic heart disease of native coronary artery without angina pectoris: Secondary | ICD-10-CM | POA: Diagnosis not present

## 2013-11-23 DIAGNOSIS — I10 Essential (primary) hypertension: Secondary | ICD-10-CM | POA: Diagnosis not present

## 2013-11-23 DIAGNOSIS — M949 Disorder of cartilage, unspecified: Secondary | ICD-10-CM | POA: Diagnosis not present

## 2013-11-29 ENCOUNTER — Telehealth: Payer: Self-pay | Admitting: *Deleted

## 2013-11-29 NOTE — Telephone Encounter (Signed)
Pt's wife dropped paperwork off in office today asking if OK for pt to start Tavares today at noon in preparation for colonoscopy. I reviewed with Alferd Apa, PharmD and should be OK for pt to take.  I spoke with pt and gave him this information.

## 2013-12-01 ENCOUNTER — Encounter (HOSPITAL_COMMUNITY): Payer: Self-pay | Admitting: *Deleted

## 2013-12-01 ENCOUNTER — Ambulatory Visit (HOSPITAL_COMMUNITY)
Admission: RE | Admit: 2013-12-01 | Discharge: 2013-12-01 | Disposition: A | Discharge status: Home / Self Care | Payer: Medicare Other | Source: Ambulatory Visit | Attending: Internal Medicine | Admitting: Internal Medicine

## 2013-12-01 ENCOUNTER — Encounter (HOSPITAL_COMMUNITY)
Admission: RE | Disposition: A | Discharge status: Home / Self Care | Payer: Self-pay | Source: Ambulatory Visit | Attending: Internal Medicine

## 2013-12-01 DIAGNOSIS — Z860100 Personal history of colon polyps, unspecified: Secondary | ICD-10-CM | POA: Diagnosis not present

## 2013-12-01 DIAGNOSIS — Z7982 Long term (current) use of aspirin: Secondary | ICD-10-CM | POA: Diagnosis not present

## 2013-12-01 DIAGNOSIS — E785 Hyperlipidemia, unspecified: Secondary | ICD-10-CM | POA: Insufficient documentation

## 2013-12-01 DIAGNOSIS — K644 Residual hemorrhoidal skin tags: Secondary | ICD-10-CM | POA: Diagnosis not present

## 2013-12-01 DIAGNOSIS — I2510 Atherosclerotic heart disease of native coronary artery without angina pectoris: Secondary | ICD-10-CM | POA: Insufficient documentation

## 2013-12-01 DIAGNOSIS — I10 Essential (primary) hypertension: Secondary | ICD-10-CM | POA: Diagnosis not present

## 2013-12-01 DIAGNOSIS — Z1211 Encounter for screening for malignant neoplasm of colon: Secondary | ICD-10-CM

## 2013-12-01 HISTORY — PX: COLONOSCOPY: SHX5424

## 2013-12-01 SURGERY — COLONOSCOPY
Anesthesia: Moderate Sedation

## 2013-12-01 MED ORDER — MIDAZOLAM HCL 5 MG/5ML IJ SOLN
INTRAMUSCULAR | Status: DC | PRN
  Administered 2013-12-01: 2 mg via INTRAVENOUS
  Administered 2013-12-01: 1 mg via INTRAVENOUS
  Administered 2013-12-01: 2 mg via INTRAVENOUS

## 2013-12-01 MED ORDER — STERILE WATER FOR IRRIGATION IR SOLN
Status: DC | PRN
  Administered 2013-12-01: 09:00:00

## 2013-12-01 MED ORDER — MEPERIDINE HCL 50 MG/ML IJ SOLN
INTRAMUSCULAR | Status: AC
  Filled 2013-12-01: qty 1

## 2013-12-01 MED ORDER — SODIUM CHLORIDE 0.9 % IV SOLN
INTRAVENOUS | Status: DC
  Administered 2013-12-01: 08:00:00 via INTRAVENOUS

## 2013-12-01 MED ORDER — MIDAZOLAM HCL 5 MG/5ML IJ SOLN
INTRAMUSCULAR | Status: AC
  Filled 2013-12-01: qty 10

## 2013-12-01 MED ORDER — MEPERIDINE HCL 50 MG/ML IJ SOLN
INTRAMUSCULAR | Status: DC | PRN
  Administered 2013-12-01 (×2): 25 mg via INTRAVENOUS

## 2013-12-01 NOTE — H&P (Signed)
Ruben Burton is an 73 y.o. male.   Chief Complaint: Patient is  here for colonoscopy. HPI: Patient is 73 year old Caucasian male with history of colonic adenomas and is here for surveillance colonoscopy. He denies abdominal pain change in his bowel habits or rectal bleeding. This was colonoscopy was in 2004 with removal of 2 tubular adenomas. Last exam was in 2001 and was negative. Family history is negative for CRC.  Past Medical History  Diagnosis Date  . Paroxysmal atrial fibrillation   . Pleural effusion   . Coronary artery disease   . Hyperlipidemia   . Hypertension   . GERD (gastroesophageal reflux disease)   . Hx of cardiovascular stress test     a. ETT-MV 1/14: no ischemia; not gated    Past Surgical History  Procedure Laterality Date  . Coronary artery bypass graft  02-13-10    2V (LIMA to LAD, SVG to diagonal)  . Cholecystectomy    . Exploratory laparotomy      for perforated duodenal ulcer  . Hydrocelectomy      Family History  Problem Relation Age of Onset  . Other Mother     alive- healthy  . Stroke Father     deceased  . Other Brother     CABG  . Colon cancer Neg Hx    Social History:  reports that he has quit smoking. His smoking use included Cigarettes. He has a .25 pack-year smoking history. He does not have any smokeless tobacco history on file. He reports that he does not drink alcohol or use illicit drugs.  Allergies:  Allergies  Allergen Reactions  . Oxytetracycline Other (See Comments)    Passes out.    Medications Prior to Admission  Medication Sig Dispense Refill  . aspirin EC 81 MG tablet Take 81 mg by mouth daily.      . B Complex-Folic Acid (B COMPLEX-VITAMIN B12 PO) Take 1 tablet by mouth daily.        . calcium carbonate (OS-CAL) 600 MG TABS Take 600 mg by mouth daily.       Marland Kitchen esomeprazole (NEXIUM) 40 MG capsule Take 40 mg by mouth daily before breakfast.        . metoprolol (LOPRESSOR) 25 MG tablet Take one tablet by mouth twice  daily.  180 tablet  3  . polyethylene glycol-electrolytes (NULYTELY/GOLYTELY) 420 G solution Take 4,000 mLs by mouth once.  4000 mL  0  . rosuvastatin (CRESTOR) 20 MG tablet Take 20 mg by mouth daily.      . nitroGLYCERIN (NITROSTAT) 0.4 MG SL tablet Place 1 tablet (0.4 mg total) under the tongue every 5 (five) minutes as needed for chest pain.  25 tablet  11    No results found for this or any previous visit (from the past 48 hour(s)). No results found.  ROS  Blood pressure 143/83, pulse 58, temperature 97.7 F (36.5 C), temperature source Oral, resp. rate 8, height 5\' 11"  (1.803 m), weight 191 lb (86.637 kg), SpO2 99.00%. Physical Exam  Constitutional: He appears well-developed and well-nourished.  HENT:  Mouth/Throat: Oropharynx is clear and moist.  Eyes: Conjunctivae are normal. No scleral icterus.  Neck: No thyromegaly present.  Cardiovascular: Normal rate, regular rhythm and normal heart sounds.   No murmur heard. Respiratory: Effort normal and breath sounds normal.  GI: Soft. He exhibits no distension and no mass. There is no tenderness.  Musculoskeletal: He exhibits no edema.  Lymphadenopathy:    He has no cervical  adenopathy.  Neurological: He is alert.  Skin: Skin is warm and dry.     Assessment/Plan History of colonic adenomas. Surveillance colonoscopy.  Cope Marte U 12/01/2013, 8:25 AM

## 2013-12-01 NOTE — Op Note (Addendum)
COLONOSCOPY PROCEDURE REPORT  PATIENT:  Ruben Burton  MR#:  [id number] Birthdate:  15-Sep-1941, 73 y.o., male Endoscopist:  Dr. Rogene Houston, MD Referred By:  Dr. Teena Irani, MD  Procedure Date: 12/01/2013  Procedure:   Colonoscopy  Indications:  Patient is 73 year old Caucasian male with history of colonic adenomas. He had two adenomas removed in 2004. Last colonoscopy was in 2000 and was negative for recurrent polyps. He is undergoing surveillance colonoscopy.  Informed Consent:  The procedure and risks were reviewed with the patient and informed consent was obtained.  Medications:  Demerol 50 mg IV Versed 5 mg IV  Description of procedure:  After a digital rectal exam was performed, that colonoscope was advanced from the anus through the rectum and colon to the area of the cecum, ileocecal valve and appendiceal orifice. The cecum was deeply intubated. These structures were well-seen and photographed for the record. From the level of the cecum and ileocecal valve, the scope was slowly and cautiously withdrawn. The mucosal surfaces were carefully surveyed utilizing scope tip to flexion to facilitate fold flattening as needed. The scope was pulled down into the rectum where a thorough exam including retroflexion was performed.  Findings:   Prep excellent. Normal mucosa of colon and rectum. Small hemorrhoids below the dentate line and single anal papilla.   Therapeutic/Diagnostic Maneuvers Performed:   None  Complications:  None  Cecal Withdrawal Time:  11 minutes  Impression:  Examination performed to cecum. No evidence of recurrent polyps. Small external hemorrhoids and single anal papilla.  Recommendations:  Standard instructions given. Patient may wait 7 years before next colonoscopy.  Kelle Ruppert U  12/01/2013 9:05 AM  CC: Dr. Thressa Sheller, MD & Dr. Rayne Du ref. provider found

## 2013-12-01 NOTE — Discharge Instructions (Signed)
Resume usual medications and diet. No driving for 24 hours. Consider next colonoscopy in 7 years.  Esophagogastroduodenoscopy Care After Refer to this sheet in the next few weeks. These instructions provide you with information on caring for yourself after your procedure. Your caregiver may also give you more specific instructions. Your treatment has been planned according to current medical practices, but problems sometimes occur. Call your caregiver if you have any problems or questions after your procedure.  HOME CARE INSTRUCTIONS  Do not eat or drink anything until the numbing medicine (local anesthetic) has worn off and your gag reflex has returned. You will know that the local anesthetic has worn off when you can swallow comfortably.  Do not drive for 12 hours after the procedure or as directed by your caregiver.  Only take medicines as directed by your caregiver. SEEK MEDICAL CARE IF:   You cannot stop coughing.  You are not urinating at all or less than usual. SEEK IMMEDIATE MEDICAL CARE IF:  You have difficulty swallowing.  You cannot eat or drink.  You have worsening throat or chest pain.  You have dizziness, lightheadedness, or you faint.  You have nausea or vomiting.  You have chills.  You have a fever.  You have severe abdominal pain.  You have black, tarry, or bloody stools. Document Released: 10/06/2012 Document Reviewed: 10/06/2012 [facility] Patient Information 2014 [place], [place].

## 2013-12-05 ENCOUNTER — Encounter (HOSPITAL_COMMUNITY): Payer: Self-pay | Admitting: Internal Medicine

## 2013-12-08 DIAGNOSIS — H6120 Impacted cerumen, unspecified ear: Secondary | ICD-10-CM | POA: Diagnosis not present

## 2013-12-08 DIAGNOSIS — I2510 Atherosclerotic heart disease of native coronary artery without angina pectoris: Secondary | ICD-10-CM | POA: Diagnosis not present

## 2013-12-08 DIAGNOSIS — I10 Essential (primary) hypertension: Secondary | ICD-10-CM | POA: Diagnosis not present

## 2013-12-08 DIAGNOSIS — I4891 Unspecified atrial fibrillation: Secondary | ICD-10-CM | POA: Diagnosis not present

## 2013-12-08 DIAGNOSIS — E785 Hyperlipidemia, unspecified: Secondary | ICD-10-CM | POA: Diagnosis not present

## 2014-01-10 ENCOUNTER — Ambulatory Visit (INDEPENDENT_AMBULATORY_CARE_PROVIDER_SITE_OTHER): Payer: Medicare Other | Admitting: Cardiovascular Disease

## 2014-01-10 ENCOUNTER — Encounter: Payer: Self-pay | Admitting: Cardiovascular Disease

## 2014-01-10 VITALS — BP 106/67 | HR 62 | Ht 72.0 in | Wt 190.0 lb

## 2014-01-10 DIAGNOSIS — I2510 Atherosclerotic heart disease of native coronary artery without angina pectoris: Secondary | ICD-10-CM | POA: Diagnosis not present

## 2014-01-10 DIAGNOSIS — E785 Hyperlipidemia, unspecified: Secondary | ICD-10-CM

## 2014-01-10 DIAGNOSIS — I10 Essential (primary) hypertension: Secondary | ICD-10-CM

## 2014-01-10 MED ORDER — NITROGLYCERIN 0.4 MG SL SUBL: 0.4000 mg | SUBLINGUAL_TABLET | SUBLINGUAL | Status: AC | PRN

## 2014-01-10 NOTE — Patient Instructions (Signed)
Your physician wants you to follow-up in:  6 months. You will receive a reminder letter in the mail two months in advance. If you don't receive a letter, please call our office to schedule the follow-up appointment.   

## 2014-01-10 NOTE — Progress Notes (Signed)
History of Present Illness: 73 yo male with history of CAD, HTN, HLD here today for follow up.  Patient was admitted 4/11 with a non-STEMI. He subsequently underwent CABG. Postop course was complicated by atrial fibrillation. Echo 4/11: EF 55-60%, mildly dilated RV. Followup with monitor was negative for recurrent atrial fibrillation.  Coumadin was discontinued. Stress myoview 11/04/12 with no ischemia.   He is here today for follow up. No chest pain or SOB. He is active. He kayaks.   Primary Care Physician: Trilby Drummer  Last Lipid Profile: Followed in primary care.   Past Medical History  Diagnosis Date  . Paroxysmal atrial fibrillation   . Pleural effusion   . Coronary artery disease   . Hyperlipidemia   . Hypertension   . GERD (gastroesophageal reflux disease)   . Hx of cardiovascular stress test     a. ETT-MV 1/14: no ischemia; not gated    Past Surgical History  Procedure Laterality Date  . Coronary artery bypass graft  02-13-10    2V (LIMA to LAD, SVG to diagonal)  . Cholecystectomy    . Exploratory laparotomy      for perforated duodenal ulcer  . Hydrocelectomy    . Colonoscopy N/A 12/01/2013    Procedure: COLONOSCOPY;  Surgeon: Rogene Houston, MD;  Location: [facility];  Service: Endoscopy;  Laterality: N/A;  830    Current Outpatient Prescriptions  Medication Sig Dispense Refill  . aspirin EC 81 MG tablet Take 81 mg by mouth daily.      . B Complex-Folic Acid (B COMPLEX-VITAMIN B12 PO) Take 1 tablet by mouth daily.        . calcium carbonate (OS-CAL) 600 MG TABS Take 600 mg by mouth daily.       Marland Kitchen esomeprazole (NEXIUM) 40 MG capsule Take 40 mg by mouth daily before breakfast.        . metoprolol (LOPRESSOR) 25 MG tablet Take one tablet by mouth twice daily.  180 tablet  3  . rosuvastatin (CRESTOR) 20 MG tablet Take 20 mg by mouth daily.      . nitroGLYCERIN (NITROSTAT) 0.4 MG SL tablet Place 1 tablet (0.4 mg total) under the tongue every 5 (five) minutes  as needed for chest pain.  25 tablet  11   No current facility-administered medications for this visit.    Allergies  Allergen Reactions  . Oxytetracycline Other (See Comments)    Passes out.    History   Social History  . Marital Status: Married    Spouse Name: N/A    Number of Children: 2  . Years of Education: N/A   Occupational History  . retirede    Social History Main Topics  . Smoking status: Former Smoker -- 0.25 packs/day for 1 years    Types: Cigarettes  . Smokeless tobacco: Not on file  . Alcohol Use: No  . Drug Use: No  . Sexual Activity: Not on file   Other Topics Concern  . Not on file   Social History Narrative  . No narrative on file    Family History  Problem Relation Age of Onset  . Other Mother     alive- healthy  . Stroke Father     deceased  . Other Brother     CABG  . Colon cancer Neg Hx     Review of Systems:  As stated in the HPI and otherwise negative.   BP 106/67  Pulse 62  Ht  6' (1.829 m)  Wt 190 lb (86.183 kg)  BMI 25.76 kg/m2  Physical Examination: General: Well developed, well nourished, NAD HEENT: OP clear, mucus membranes moist SKIN: warm, dry. No rashes. Neuro: No focal deficits Musculoskeletal: Muscle strength 5/5 all ext Psychiatric: Mood and affect normal Neck: No JVD, no carotid bruits, no thyromegaly, no lymphadenopathy. Lungs:Clear bilaterally, no wheezes, rhonci, crackles Cardiovascular: Regular rate and rhythm. No murmurs, gallops or rubs. Abdomen:Soft. Bowel sounds present. Non-tender.  Extremities: No lower extremity edema. Pulses are 2 + in the bilateral DP/PT.  Stress myoview 11/04/12:  Stress Procedure: The patient exercised on the treadmill utilizing the Bruce Protocol for 6:30 minutes. The patient stopped due to fatigue and denied any chest pain. Technetium 17m Sestamibi was injected at peak exercise and myocardial perfusion imaging was performed after a brief delay.  Stress ECG: No significant  change from baseline ECG  QPS  Raw Data Images: Normal; no motion artifact; normal heart/lung ratio.  Stress Images: Normal homogeneous uptake in all areas of the myocardium.  Rest Images: Normal homogeneous uptake in all areas of the myocardium.  Subtraction (SDS): No evidence of ischemia.  Transient Ischemic Dilatation (Normal <1.22): 1.02  Lung/Heart Ratio (Normal <0.45): 0.25  Quantitative Gated Spect Images  QGS EDV: n/a ml  QGS ESV: n/a ml  Impression  Exercise Capacity: Good exercise capacity.  BP Response: Hypertensive blood pressure response.  Clinical Symptoms: No chest pain.  ECG Impression: No significant ST segment change suggestive of ischemia.  Comparison with Prior Nuclear Study: No previous nuclear study performed  Overall Impression: Low risk stress nuclear study. Good exercise tolerance. No evidence of ischemia.  LV Ejection Fraction: Study not gated. LV Wall Motion: Because of frequent PVCs the study was non-gated.   Assessment and Plan:   1. Coronary Artery Disease: Continue aspirin, beta blocker and statin. Stress Myoview January 2014 without ischemia.   2. Hypertension: Controlled. Continue current therapy.   3. Hyperlipidemia: Managed by primary care. Well controlled. Last LDL 46

## 2014-02-14 DIAGNOSIS — D485 Neoplasm of uncertain behavior of skin: Secondary | ICD-10-CM | POA: Diagnosis not present

## 2014-02-14 DIAGNOSIS — L678 Other hair color and hair shaft abnormalities: Secondary | ICD-10-CM | POA: Diagnosis not present

## 2014-02-14 DIAGNOSIS — L738 Other specified follicular disorders: Secondary | ICD-10-CM | POA: Diagnosis not present

## 2014-06-01 DIAGNOSIS — E538 Deficiency of other specified B group vitamins: Secondary | ICD-10-CM | POA: Diagnosis not present

## 2014-06-01 DIAGNOSIS — I10 Essential (primary) hypertension: Secondary | ICD-10-CM | POA: Diagnosis not present

## 2014-06-01 DIAGNOSIS — M899 Disorder of bone, unspecified: Secondary | ICD-10-CM | POA: Diagnosis not present

## 2014-06-08 DIAGNOSIS — I2510 Atherosclerotic heart disease of native coronary artery without angina pectoris: Secondary | ICD-10-CM | POA: Diagnosis not present

## 2014-06-08 DIAGNOSIS — I10 Essential (primary) hypertension: Secondary | ICD-10-CM | POA: Diagnosis not present

## 2014-06-08 DIAGNOSIS — K219 Gastro-esophageal reflux disease without esophagitis: Secondary | ICD-10-CM | POA: Diagnosis not present

## 2014-06-08 DIAGNOSIS — E785 Hyperlipidemia, unspecified: Secondary | ICD-10-CM | POA: Diagnosis not present

## 2014-07-06 DIAGNOSIS — D485 Neoplasm of uncertain behavior of skin: Secondary | ICD-10-CM | POA: Diagnosis not present

## 2014-07-06 DIAGNOSIS — L821 Other seborrheic keratosis: Secondary | ICD-10-CM | POA: Diagnosis not present

## 2014-07-06 DIAGNOSIS — D235 Other benign neoplasm of skin of trunk: Secondary | ICD-10-CM | POA: Diagnosis not present

## 2014-07-12 ENCOUNTER — Encounter: Payer: Self-pay | Admitting: Cardiovascular Disease

## 2014-07-12 ENCOUNTER — Ambulatory Visit (INDEPENDENT_AMBULATORY_CARE_PROVIDER_SITE_OTHER): Payer: Medicare Other | Admitting: Cardiovascular Disease

## 2014-07-12 VITALS — BP 120/76 | HR 64 | Ht 73.0 in | Wt 190.0 lb

## 2014-07-12 DIAGNOSIS — E785 Hyperlipidemia, unspecified: Secondary | ICD-10-CM

## 2014-07-12 DIAGNOSIS — I4891 Unspecified atrial fibrillation: Secondary | ICD-10-CM

## 2014-07-12 DIAGNOSIS — I2510 Atherosclerotic heart disease of native coronary artery without angina pectoris: Secondary | ICD-10-CM

## 2014-07-12 DIAGNOSIS — I10 Essential (primary) hypertension: Secondary | ICD-10-CM | POA: Diagnosis not present

## 2014-07-12 DIAGNOSIS — I480 Paroxysmal atrial fibrillation: Secondary | ICD-10-CM

## 2014-07-12 MED ORDER — OMEPRAZOLE 20 MG PO CPDR: 20.0000 mg | DELAYED_RELEASE_CAPSULE | Freq: Every day | ORAL | Status: DC

## 2014-07-12 NOTE — Progress Notes (Signed)
History of Present Illness: 73 yo male with history of CAD, HTN, HLD here today for follow up.  Patient was admitted 4/11 with a non-STEMI. He subsequently underwent CABG. Postop course was complicated by atrial fibrillation. Echo 4/11: EF 55-60%, mildly dilated RV. Followup with monitor was negative for recurrent atrial fibrillation.  Coumadin was discontinued. Stress myoview 11/04/12 with no ischemia.   He is here today for follow up. No chest pain or SOB. He describes overall lack of energy. Feels anxious. No awareness of palpitations or skipped beats. No near syncope or syncope.    Primary Care Physician: Trilby Drummer  Last Lipid Profile: Followed in primary care.   Past Medical History  Diagnosis Date  . Paroxysmal atrial fibrillation   . Pleural effusion   . Coronary artery disease   . Hyperlipidemia   . Hypertension   . GERD (gastroesophageal reflux disease)   . Hx of cardiovascular stress test     a. ETT-MV 1/14: no ischemia; not gated    Past Surgical History  Procedure Laterality Date  . Coronary artery bypass graft  02-13-10    2V (LIMA to LAD, SVG to diagonal)  . Cholecystectomy    . Exploratory laparotomy      for perforated duodenal ulcer  . Hydrocelectomy    . Colonoscopy N/A 12/01/2013    Procedure: COLONOSCOPY;  Surgeon: Rogene Houston, MD;  Location: [facility];  Service: Endoscopy;  Laterality: N/A;  830    Current Outpatient Prescriptions  Medication Sig Dispense Refill  . aspirin EC 81 MG tablet Take 81 mg by mouth daily.      . B Complex-Folic Acid (B COMPLEX-VITAMIN B12 PO) Take 1 tablet by mouth daily.        . calcium carbonate (OS-CAL) 600 MG TABS Take 600 mg by mouth daily.       . metoprolol (LOPRESSOR) 25 MG tablet Take one tablet by mouth twice daily.  180 tablet  3  . nitroGLYCERIN (NITROSTAT) 0.4 MG SL tablet Place 1 tablet (0.4 mg total) under the tongue every 5 (five) minutes as needed for chest pain.  25 tablet  11  . rosuvastatin  (CRESTOR) 20 MG tablet Take 1 tab every other day ( doughnut hole)      . esomeprazole (NEXIUM) 40 MG capsule Take 40 mg by mouth daily before breakfast. Pt taking med  As needed because he has reached the doughnut hole       No current facility-administered medications for this visit.    Allergies  Allergen Reactions  . Oxytetracycline Other (See Comments)    Passes out.    History   Social History  . Marital Status: Married    Spouse Name: N/A    Number of Children: 2  . Years of Education: N/A   Occupational History  . retirede    Social History Main Topics  . Smoking status: Former Smoker -- 0.25 packs/day for 1 years    Types: Cigarettes  . Smokeless tobacco: Not on file  . Alcohol Use: No  . Drug Use: No  . Sexual Activity: Not on file   Other Topics Concern  . Not on file   Social History Narrative  . No narrative on file    Family History  Problem Relation Age of Onset  . Other Mother     alive- healthy  . Stroke Father     deceased  . Other Brother     CABG  .  Colon cancer Neg Hx     Review of Systems:  As stated in the HPI and otherwise negative.   BP 120/76  Pulse 64  Ht 6\' 1"  (1.854 m)  Wt 190 lb (86.183 kg)  BMI 25.07 kg/m2  Physical Examination: General: Well developed, well nourished, NAD HEENT: OP clear, mucus membranes moist SKIN: warm, dry. No rashes. Neuro: No focal deficits Musculoskeletal: Muscle strength 5/5 all ext Psychiatric: Mood and affect normal Neck: No JVD, no carotid bruits, no thyromegaly, no lymphadenopathy. Lungs:Clear bilaterally, no wheezes, rhonci, crackles Cardiovascular: Regular rate and rhythm. No murmurs, gallops or rubs. Abdomen:Soft. Bowel sounds present. Non-tender.  Extremities: No lower extremity edema. Pulses are 2 + in the bilateral DP/PT.  EKG: NSR, rate 64 bpm.   Stress myoview 11/04/12:  Stress Procedure: The patient exercised on the treadmill utilizing the Bruce Protocol for 6:30 minutes. The  patient stopped due to fatigue and denied any chest pain. Technetium 17m Sestamibi was injected at peak exercise and myocardial perfusion imaging was performed after a brief delay.  Stress ECG: No significant change from baseline ECG  QPS  Raw Data Images: Normal; no motion artifact; normal heart/lung ratio.  Stress Images: Normal homogeneous uptake in all areas of the myocardium.  Rest Images: Normal homogeneous uptake in all areas of the myocardium.  Subtraction (SDS): No evidence of ischemia.  Transient Ischemic Dilatation (Normal <1.22): 1.02  Lung/Heart Ratio (Normal <0.45): 0.25  Quantitative Gated Spect Images  QGS EDV: n/a ml  QGS ESV: n/a ml  Impression  Exercise Capacity: Good exercise capacity.  BP Response: Hypertensive blood pressure response.  Clinical Symptoms: No chest pain.  ECG Impression: No significant ST segment change suggestive of ischemia.  Comparison with Prior Nuclear Study: No previous nuclear study performed  Overall Impression: Low risk stress nuclear study. Good exercise tolerance. No evidence of ischemia.  LV Ejection Fraction: Study not gated. LV Wall Motion: Because of frequent PVCs the study was non-gated.   Assessment and Plan:   1. Coronary Artery Disease: Stable. Continue aspirin, beta blocker and statin. Stress Myoview January 2014 without ischemia.   2. Hypertension: Controlled. Continue current therapy.   3. Hyperlipidemia: Managed by primary care. Well controlled. Last LDL 48 January 2015  4. Paroxysmal atrial fibrillation: Sinus today. No recurrence since his post-op period in 2011.

## 2014-07-12 NOTE — Patient Instructions (Signed)
Your physician wants you to follow-up in:  6 months.  You will receive a reminder letter in the mail two months in advance. If you don't receive a letter, please call our office to schedule the follow-up appointment.  Your physician has recommended you make the following change in your medication:  Stop Nexium. Start Omeprazole 20 mg by mouth daily

## 2014-09-06 DIAGNOSIS — Z23 Encounter for immunization: Secondary | ICD-10-CM | POA: Diagnosis not present

## 2014-09-29 DIAGNOSIS — H5203 Hypermetropia, bilateral: Secondary | ICD-10-CM | POA: Diagnosis not present

## 2014-09-29 DIAGNOSIS — H2510 Age-related nuclear cataract, unspecified eye: Secondary | ICD-10-CM | POA: Diagnosis not present

## 2014-09-29 DIAGNOSIS — H524 Presbyopia: Secondary | ICD-10-CM | POA: Diagnosis not present

## 2014-09-29 DIAGNOSIS — H52222 Regular astigmatism, left eye: Secondary | ICD-10-CM | POA: Diagnosis not present

## 2014-11-02 ENCOUNTER — Telehealth: Payer: Self-pay

## 2014-11-02 NOTE — Telephone Encounter (Signed)
Thanks, cdm 

## 2014-11-02 NOTE — Telephone Encounter (Signed)
Pt's wife Ruben Burton calling requesting an increase in of the Omeprazole 20mg , increased to 40 mg, wife states that the 20 mg is not helping. Wife would like for it to be sent to [facility]. Please advise, thanks.

## 2014-11-02 NOTE — Telephone Encounter (Signed)
Spoke with pt's wife and asked her to contact primary care to discuss on going issues with GERD. Wife agreeable with this plan.  Was changed at last office visit per pt request due to cost.

## 2014-12-04 DIAGNOSIS — Z0000 Encounter for general adult medical examination without abnormal findings: Secondary | ICD-10-CM | POA: Diagnosis not present

## 2014-12-04 DIAGNOSIS — I10 Essential (primary) hypertension: Secondary | ICD-10-CM | POA: Diagnosis not present

## 2014-12-04 DIAGNOSIS — Z125 Encounter for screening for malignant neoplasm of prostate: Secondary | ICD-10-CM | POA: Diagnosis not present

## 2014-12-04 DIAGNOSIS — M8580 Other specified disorders of bone density and structure, unspecified site: Secondary | ICD-10-CM | POA: Diagnosis not present

## 2014-12-04 DIAGNOSIS — Z23 Encounter for immunization: Secondary | ICD-10-CM | POA: Diagnosis not present

## 2014-12-04 DIAGNOSIS — M859 Disorder of bone density and structure, unspecified: Secondary | ICD-10-CM | POA: Diagnosis not present

## 2014-12-04 DIAGNOSIS — Z1389 Encounter for screening for other disorder: Secondary | ICD-10-CM | POA: Diagnosis not present

## 2014-12-04 DIAGNOSIS — D81818 Other biotin-dependent carboxylase deficiency: Secondary | ICD-10-CM | POA: Diagnosis not present

## 2014-12-11 DIAGNOSIS — I10 Essential (primary) hypertension: Secondary | ICD-10-CM | POA: Diagnosis not present

## 2014-12-11 DIAGNOSIS — M8580 Other specified disorders of bone density and structure, unspecified site: Secondary | ICD-10-CM | POA: Diagnosis not present

## 2014-12-11 DIAGNOSIS — E785 Hyperlipidemia, unspecified: Secondary | ICD-10-CM | POA: Diagnosis not present

## 2014-12-11 DIAGNOSIS — I2510 Atherosclerotic heart disease of native coronary artery without angina pectoris: Secondary | ICD-10-CM | POA: Diagnosis not present

## 2014-12-15 ENCOUNTER — Telehealth: Payer: Self-pay | Admitting: Cardiovascular Disease

## 2014-12-15 NOTE — Telephone Encounter (Signed)
New problem     Per wife pt having CP 5 days now.

## 2014-12-15 NOTE — Telephone Encounter (Signed)
Received call directly from operator from patient's wife who states patient is having chest pain and requests to see Dr. Angelena Form today.  I asked to talk with patient who c/o intermittent chest pain described as "tightness" x approximately 5 days.  Patient states he has not taken NTG; states pain may be improved since taking Rolaid OTC antacid.  Patient denies SOB, nausea, diaphoresis.  Patient has hx of CAD, CABG 4/11.  I advised that Dr. Angelena Form is not in the office today and advised that patient could be evaluated by Melina Copa, PA-C today.  Patient states he has an appointment with Dr. Angelena Form on Tuesday 2/16 and thinks he wants to wait and see him on Tuesday.  I advised patient that if pain returns, he should take NTG up to 3 times and if pain persists, to go to the ED.  I again advised that patient can be seen in the office today by our [facility] and patient refused.  I advised patient to call back with questions or concerns or proceed to the ED.  Patient verbalized understanding and agreement.

## 2014-12-16 NOTE — Telephone Encounter (Signed)
He should go to the ED if he continues having chest pain. I will be glad to see him as planned Tuesday if he is stable. Ruben Burton

## 2014-12-19 ENCOUNTER — Ambulatory Visit (INDEPENDENT_AMBULATORY_CARE_PROVIDER_SITE_OTHER): Payer: Medicare Other | Admitting: Cardiovascular Disease

## 2014-12-19 ENCOUNTER — Encounter: Payer: Self-pay | Admitting: Cardiovascular Disease

## 2014-12-19 VITALS — BP 120/82 | HR 72 | Ht 73.0 in | Wt 190.0 lb

## 2014-12-19 DIAGNOSIS — E785 Hyperlipidemia, unspecified: Secondary | ICD-10-CM | POA: Diagnosis not present

## 2014-12-19 DIAGNOSIS — I25709 Atherosclerosis of coronary artery bypass graft(s), unspecified, with unspecified angina pectoris: Secondary | ICD-10-CM | POA: Diagnosis not present

## 2014-12-19 DIAGNOSIS — I10 Essential (primary) hypertension: Secondary | ICD-10-CM | POA: Diagnosis not present

## 2014-12-19 DIAGNOSIS — I480 Paroxysmal atrial fibrillation: Secondary | ICD-10-CM

## 2014-12-19 NOTE — Progress Notes (Signed)
History of Present Illness: 74 yo male with history of CAD, HTN, HLD here today for follow up.  Patient was admitted 4/11 with a non-STEMI. He subsequently underwent CABG. Postop course was complicated by atrial fibrillation. Echo 4/11: EF 55-60%, mildly dilated RV. Followup with monitor was negative for recurrent atrial fibrillation.  Coumadin was discontinued. Stress myoview 11/04/12 with no ischemia.   He is here today for follow up. He describes chest pains last week after heavy lifting. No exertional chest pains. No associated SOb. Occasional skipped beats without prolonged periods of tachycardia. No near syncope or syncope.    Primary Care Physician: Trilby Drummer  Last Lipid Profile: Followed in primary care.   Past Medical History  Diagnosis Date  . Paroxysmal atrial fibrillation   . Pleural effusion   . Coronary artery disease   . Hyperlipidemia   . Hypertension   . GERD (gastroesophageal reflux disease)   . Hx of cardiovascular stress test     a. ETT-MV 1/14: no ischemia; not gated    Past Surgical History  Procedure Laterality Date  . Coronary artery bypass graft  02-13-10    2V (LIMA to LAD, SVG to diagonal)  . Cholecystectomy    . Exploratory laparotomy      for perforated duodenal ulcer  . Hydrocelectomy    . Colonoscopy N/A 12/01/2013    Procedure: COLONOSCOPY;  Surgeon: Rogene Houston, MD;  [facility]: [facility];  Service: Endoscopy;  Laterality: N/A;  830    Current Outpatient Prescriptions  Medication Sig Dispense Refill  . aspirin EC 81 MG tablet Take 81 mg by mouth daily.    . B Complex-Folic Acid (B COMPLEX-VITAMIN B12 PO) Take 1 tablet by mouth daily.      . calcium carbonate (OS-CAL) 600 MG TABS Take 600 mg by mouth daily.     . metoprolol (LOPRESSOR) 25 MG tablet Take one tablet by mouth twice daily. 180 tablet 3  . nitroGLYCERIN (NITROSTAT) 0.4 MG SL tablet Place 1 tablet (0.4 mg total) under the tongue every 5 (five) minutes as needed for chest  pain. 25 tablet 11  . omeprazole (PRILOSEC) 20 MG capsule Take 1 capsule (20 mg total) by mouth daily. (Patient taking differently: Take 40 mg by mouth daily. ) 30 capsule 11  . rosuvastatin (CRESTOR) 20 MG tablet Take 1 tab every other day ( doughnut hole)     No current facility-administered medications for this visit.    Allergies  Allergen Reactions  . Oxytetracycline Other (See Comments)    Passes out.    History   Social History  . Marital Status: Married    Spouse Name: N/A  . Number of Children: 2  . Years of Education: N/A   Occupational History  . retirede    Social History Main Topics  . Smoking status: Former Smoker -- 0.25 packs/day for 1 years    Types: Cigarettes  . Smokeless tobacco: Not on file  . Alcohol Use: No  . Drug Use: No  . Sexual Activity: Not on file   Other Topics Concern  . Not on file   Social History Narrative    Family History  Problem Relation Age of Onset  . Other Mother     alive- healthy  . Stroke Father     deceased  . Other Brother     CABG  . Colon cancer Neg Hx     Review of Systems:  As stated in the HPI  and otherwise negative.   BP 120/82 mmHg  Pulse 72  Ht 6\' 1"  (1.854 m)  Wt 190 lb (86.183 kg)  BMI 25.07 kg/m2  Physical Examination: General: Well developed, well nourished, NAD HEENT: OP clear, mucus membranes moist SKIN: warm, dry. No rashes. Neuro: No focal deficits Musculoskeletal: Muscle strength 5/5 all ext Psychiatric: Mood and affect normal Neck: No JVD, no carotid bruits, no thyromegaly, no lymphadenopathy. Lungs:Clear bilaterally, no wheezes, rhonci, crackles Cardiovascular: Regular rate and rhythm. No murmurs, gallops or rubs. Abdomen:Soft. Bowel sounds present. Non-tender.  Extremities: No lower extremity edema. Pulses are 2 + in the bilateral DP/PT.  EKG: NSR, rate 64 bpm.   Stress myoview 11/04/12:  Stress Procedure: The patient exercised on the treadmill utilizing the Bruce Protocol for  6:30 minutes. The patient stopped due to fatigue and denied any chest pain. Technetium 20m Sestamibi was injected at peak exercise and myocardial perfusion imaging was performed after a brief delay.  Stress ECG: No significant change from baseline ECG  QPS  Raw Data Images: Normal; no motion artifact; normal heart/lung ratio.  Stress Images: Normal homogeneous uptake in all areas of the myocardium.  Rest Images: Normal homogeneous uptake in all areas of the myocardium.  Subtraction (SDS): No evidence of ischemia.  Transient Ischemic Dilatation (Normal <1.22): 1.02  Lung/Heart Ratio (Normal <0.45): 0.25  Quantitative Gated Spect Images  QGS EDV: n/a ml  QGS ESV: n/a ml  Impression  Exercise Capacity: Good exercise capacity.  BP Response: Hypertensive blood pressure response.  Clinical Symptoms: No chest pain.  ECG Impression: No significant ST segment change suggestive of ischemia.  Comparison with Prior Nuclear Study: No previous nuclear study performed  Overall Impression: Low risk stress nuclear study. Good exercise tolerance. No evidence of ischemia.  LV Ejection Fraction: Study not gated. LV Wall Motion: Because of frequent PVCs the study was non-gated.   EKG: NSR, rate 72 bpm. LAE.   Assessment and Plan:   1. Coronary Artery Disease: Recent chest pains. Occurred after heavy lifting. No pain last 4 days. Continue aspirin, beta blocker and statin. Stress Myoview January 2014 without ischemia. Will arrange repeat exercise stress myoview to exclude ischemia.   2. Hypertension: Controlled. Continue current therapy.   3. Hyperlipidemia: Managed by primary care. Well controlled. Last LDL 67 January 2016  4. Paroxysmal atrial fibrillation: Sinus today with PVCs. No recurrence of atrial fibrillation since his post-op period in 2011.

## 2014-12-19 NOTE — Patient Instructions (Signed)
Your physician recommends that you schedule a follow-up appointment in:  6-8 weeks.   Your physician has requested that you have an exercise stress myoview. For further information please visit [email or web address]. Please follow instruction sheet, as given.

## 2014-12-28 ENCOUNTER — Telehealth: Payer: Self-pay | Admitting: *Deleted

## 2014-12-28 ENCOUNTER — Ambulatory Visit (HOSPITAL_COMMUNITY): Payer: Medicare Other | Attending: Cardiology | Admitting: Radiology

## 2014-12-28 DIAGNOSIS — R079 Chest pain, unspecified: Secondary | ICD-10-CM | POA: Diagnosis not present

## 2014-12-28 DIAGNOSIS — I480 Paroxysmal atrial fibrillation: Secondary | ICD-10-CM | POA: Diagnosis not present

## 2014-12-28 DIAGNOSIS — I25709 Atherosclerosis of coronary artery bypass graft(s), unspecified, with unspecified angina pectoris: Secondary | ICD-10-CM | POA: Diagnosis not present

## 2014-12-28 DIAGNOSIS — I10 Essential (primary) hypertension: Secondary | ICD-10-CM | POA: Diagnosis not present

## 2014-12-28 MED ORDER — TECHNETIUM TC 99M SESTAMIBI GENERIC - CARDIOLITE
33.0000 | Freq: Once | INTRAVENOUS | Status: AC | PRN
  Administered 2014-12-28: 33 via INTRAVENOUS

## 2014-12-28 MED ORDER — REGADENOSON 0.4 MG/5ML IV SOLN
0.4000 mg | Freq: Once | INTRAVENOUS | Status: AC
  Administered 2014-12-28: 0.4 mg via INTRAVENOUS

## 2014-12-28 MED ORDER — TECHNETIUM TC 99M SESTAMIBI GENERIC - CARDIOLITE
11.0000 | Freq: Once | INTRAVENOUS | Status: AC | PRN
  Administered 2014-12-28: 11 via INTRAVENOUS

## 2014-12-28 NOTE — Progress Notes (Signed)
[facility] [address] [place], [place] [address] [phone]    Cardiology Nuclear Med Study  Ruben Burton is a 74 y.o. male     MRN : [id number]     DOB: [birth year]  Procedure Date: 12/28/2014  Nuclear Med Background Indication for Stress Test:  Evaluation for Ischemia and Graft Patency History:  CAD, MPI 2014, Afib Cardiac Risk Factors: Hypertension  Symptoms:  Chest Pain   Nuclear Pre-Procedure Caffeine/Decaff Intake:  None> 12 hrs NPO After: 6:00pm   Lungs:  clear O2 Sat: 96% on room air. IV 0.9% NS with Angio Cath:  22g  IV Site: R Hand x 1, tolerated well IV Started by:  Irven Baltimore, RN  Chest Size (in):  42 Cup Size: n/a  Height: 6\' 1"  (1.854 m)  Weight:  187 lb (84.823 kg)  BMI:  Body mass index is 24.68 kg/(m^2). Tech Comments:  Patient took last dose of Metoprolol at 6:00pm yesterday. Irven Baltimore, RN.    Nuclear Med Study 1 or 2 day study: 1 day  Stress Test Type:  Treadmill/Lexiscan  Reading MD: N/A  Order Authorizing Provider:  [name], MD  Resting Radionuclide: Technetium 59m Sestamibi  Resting Radionuclide Dose: 11.0 mCi   Stress Radionuclide:  Technetium 54m Sestamibi  Stress Radionuclide Dose: 33.0 mCi           Stress Protocol Rest HR: 61 Stress HR: 126  Rest BP: 140/95 Stress BP: 171/99  Exercise Time (min): n/a METS: n/a   Predicted Max HR: 146 bpm % Max HR: 86.3 bpm Rate Pressure Product: 22050   Dose of Adenosine (mg):  n/a Dose of Lexiscan: 0.4 mg  Dose of Atropine (mg): n/a Dose of Dobutamine: n/a mcg/kg/min (at max HR)  Stress Test Technologist: Glade Lloyd, BS-ES  Nuclear Technologist:  Earl Many, CNMT     Rest Procedure:  Myocardial perfusion imaging was performed at rest 45 minutes following the intravenous administration of Technetium 75m Sestamibi. Rest ECG: NSR - Normal EKG with nonspecific T-wave inversion aVL  Stress Procedure:  The patient received IV Lexiscan 0.4 mg  over 15-seconds with concurrent low level exercise and then Technetium 11m Sestamibi was injected at 30-seconds while the patient continued walking one more minute.  Quantitative spect images were obtained after a 45-minute delay.  Attempted to walk patient on Bruce protocol but his pressure in the first stage was 179/108.  Switched to low level Lexiscan.  Patient complained of slight lightheadedness that resolved in recovery.  Stress ECG: No significant ST segment change suggestive of ischemia. and There are scattered PVCs. There is accentuation of nonspecific T-wave change in aVL during stress.  QPS Raw Data Images:  Mild diaphragmatic attenuation.  Normal left ventricular size. Stress Images:  There is subtle decreased uptake seen at both rest and stress in the basal inferior distribution consistent with diaphragmatic attenuation. Otherwise, homogeneous radiotracer uptake at both rest and stress. Rest Images:  As above Subtraction (SDS):  No evidence of ischemia. Transient Ischemic Dilatation (Normal <1.22):  1.12 Lung/Heart Ratio (Normal <0.45):  0.22  Quantitative Gated Spect Images QGS EDV:  105 ml QGS ESV:  47 ml  Impression Exercise Capacity:  Lexiscan with low level exercise. BP Response:  Normal blood pressure response. Clinical Symptoms:  Slight lightheadedness during exercise. ECG Impression:  No significant ST segment change suggestive of ischemia. Comparison with Prior Nuclear Study: No images to compare  Overall Impression:  Low risk stress nuclear study with  no evidence of ischemia identified..  LV Ejection Fraction: 55%.  LV Wall Motion:  NL LV Function; NL Wall Motion  Candee Furbish, MD

## 2014-12-28 NOTE — Telephone Encounter (Signed)
Duplicate chart

## 2015-01-10 ENCOUNTER — Ambulatory Visit: Payer: Medicare Other | Admitting: Cardiovascular Disease

## 2015-01-25 DIAGNOSIS — L820 Inflamed seborrheic keratosis: Secondary | ICD-10-CM | POA: Diagnosis not present

## 2015-01-25 DIAGNOSIS — L919 Hypertrophic disorder of the skin, unspecified: Secondary | ICD-10-CM | POA: Diagnosis not present

## 2015-01-25 DIAGNOSIS — L821 Other seborrheic keratosis: Secondary | ICD-10-CM | POA: Diagnosis not present

## 2015-01-25 DIAGNOSIS — D225 Melanocytic nevi of trunk: Secondary | ICD-10-CM | POA: Diagnosis not present

## 2015-02-15 ENCOUNTER — Encounter: Payer: Self-pay | Admitting: Cardiovascular Disease

## 2015-02-15 ENCOUNTER — Ambulatory Visit (INDEPENDENT_AMBULATORY_CARE_PROVIDER_SITE_OTHER): Payer: Medicare Other | Admitting: Cardiovascular Disease

## 2015-02-15 VITALS — BP 118/74 | HR 74 | Ht 73.0 in | Wt 193.4 lb

## 2015-02-15 DIAGNOSIS — I25709 Atherosclerosis of coronary artery bypass graft(s), unspecified, with unspecified angina pectoris: Secondary | ICD-10-CM | POA: Diagnosis not present

## 2015-02-15 DIAGNOSIS — I10 Essential (primary) hypertension: Secondary | ICD-10-CM

## 2015-02-15 DIAGNOSIS — E785 Hyperlipidemia, unspecified: Secondary | ICD-10-CM

## 2015-02-15 DIAGNOSIS — I480 Paroxysmal atrial fibrillation: Secondary | ICD-10-CM | POA: Diagnosis not present

## 2015-02-15 DIAGNOSIS — I2510 Atherosclerotic heart disease of native coronary artery without angina pectoris: Secondary | ICD-10-CM | POA: Diagnosis not present

## 2015-02-15 NOTE — Progress Notes (Signed)
Chief Complaint  Patient presents with  . Palpitations     History of Present Illness: 74 yo male with history of CAD, HTN, HLD here today for follow up.  Patient was admitted 4/11 with a non-STEMI. He subsequently underwent CABG. Postop course was complicated by atrial fibrillation. Echo 4/11: EF 55-60%, mildly dilated RV. Followup with monitor was negative for recurrent atrial fibrillation.  Coumadin was discontinued. Stress myoview February 2016 with no ischemia. PVCs noted on EKG during stress.   He is here today for follow up. No exertional chest pains or SOB.  Feeling well. Occasional palpitations.   Primary Care Physician: Trilby Drummer  Last Lipid Profile: Followed in primary care.   Past Medical History  Diagnosis Date  . Paroxysmal atrial fibrillation   . Pleural effusion   . Coronary artery disease   . Hyperlipidemia   . Hypertension   . GERD (gastroesophageal reflux disease)   . Hx of cardiovascular stress test     a. ETT-MV 1/14: no ischemia; not gated    Past Surgical History  Procedure Laterality Date  . Coronary artery bypass graft  02-13-10    2V (LIMA to LAD, SVG to diagonal)  . Cholecystectomy    . Exploratory laparotomy      for perforated duodenal ulcer  . Hydrocelectomy    . Colonoscopy N/A 12/01/2013    Procedure: COLONOSCOPY;  Surgeon: Rogene Houston, MD;  Location: [facility];  Service: Endoscopy;  Laterality: N/A;  830    Current Outpatient Prescriptions  Medication Sig Dispense Refill  . aspirin EC 81 MG tablet Take 81 mg by mouth daily.    . B Complex-Folic Acid (B COMPLEX-VITAMIN B12 PO) Take 1 tablet by mouth daily.      . calcium carbonate (OS-CAL) 600 MG TABS Take 600 mg by mouth daily.     . metoprolol (LOPRESSOR) 25 MG tablet Take one tablet by mouth twice daily. 180 tablet 3  . nitroGLYCERIN (NITROSTAT) 0.4 MG SL tablet Place 1 tablet (0.4 mg total) under the tongue every 5 (five) minutes as needed for chest pain. 25 tablet 11  .  omeprazole (PRILOSEC) 40 MG capsule Take 40 mg by mouth daily.    . rosuvastatin (CRESTOR) 20 MG tablet Take 1 tab every other day ( doughnut hole)     No current facility-administered medications for this visit.    Allergies  Allergen Reactions  . Oxytetracycline Other (See Comments)    Passes out.    History   Social History  . Marital Status: Married    Spouse Name: N/A  . Number of Children: 2  . Years of Education: N/A   Occupational History  . retirede    Social History Main Topics  . Smoking status: Former Smoker -- 0.25 packs/day for 1 years    Types: Cigarettes  . Smokeless tobacco: Not on file  . Alcohol Use: No  . Drug Use: No  . Sexual Activity: Not on file   Other Topics Concern  . Not on file   Social History Narrative    Family History  Problem Relation Age of Onset  . Other Mother     alive- healthy  . Stroke Father     deceased  . Other Brother     CABG  . Colon cancer Neg Hx     Review of Systems:  As stated in the HPI and otherwise negative.   BP 118/74 mmHg  Pulse 74  Ht 6'  1" (1.854 m)  Wt 193 lb 6.4 oz (87.726 kg)  BMI 25.52 kg/m2  Physical Examination: General: Well developed, well nourished, NAD HEENT: OP clear, mucus membranes moist SKIN: warm, dry. No rashes. Neuro: No focal deficits Musculoskeletal: Muscle strength 5/5 all ext Psychiatric: Mood and affect normal Neck: No JVD, no carotid bruits, no thyromegaly, no lymphadenopathy. Lungs:Clear bilaterally, no wheezes, rhonci, crackles Cardiovascular: Regular rate and rhythm. No murmurs, gallops or rubs. Abdomen:Soft. Bowel sounds present. Non-tender.  Extremities: No lower extremity edema. Pulses are 2 + in the bilateral DP/PT.  Stress myoview 12/28/14: Stress Procedure: The patient received IV Lexiscan 0.4 mg over 15-seconds with concurrent low level exercise and then Technetium 53m Sestamibi was injected at 30-seconds while the patient continued walking one more minute.  Quantitative spect images were obtained after a 45-minute delay. Attempted to walk patient on Bruce protocol but his pressure in the first stage was 179/108. Switched to low level Lexiscan. Patient complained of slight lightheadedness that resolved in recovery. Stress ECG: No significant ST segment change suggestive of ischemia. and There are scattered PVCs. There is accentuation of nonspecific T-wave change in aVL during stress. QPS Raw Data Images: Mild diaphragmatic attenuation. Normal left ventricular size. Stress Images: There is subtle decreased uptake seen at both rest and stress in the basal inferior distribution consistent with diaphragmatic attenuation. Otherwise, homogeneous radiotracer uptake at both rest and stress. Rest Images: As above Subtraction (SDS): No evidence of ischemia. Transient Ischemic Dilatation (Normal <1.22): 1.12 Lung/Heart Ratio (Normal <0.45): 0.22 Quantitative Gated Spect Images QGS EDV: 105 ml QGS ESV: 47 ml Impression Exercise Capacity: Lexiscan with low level exercise. BP Response: Normal blood pressure response. Clinical Symptoms: Slight lightheadedness during exercise. ECG Impression: No significant ST segment change suggestive of ischemia. Comparison with Prior Nuclear Study: No images to compare Overall Impression: Low risk stress nuclear study with no evidence of ischemia identified.. LV Ejection Fraction: 55%. LV Wall Motion: NL LV Function; NL Wall Motion  EKG:  EKG is not ordered today. The ekg ordered today demonstrates  Recent Labs: No results found for requested labs within last 365 days.   Lipid Panel    Component Value Date/Time   CHOL 116 05/14/2010 0908   TRIG 141.0 05/14/2010 0908   HDL 32.80* 05/14/2010 0908   CHOLHDL 4 05/14/2010 0908   VLDL 28.2 05/14/2010 0908   LDLCALC 55 05/14/2010 0908     Wt Readings from Last 3 Encounters:  02/15/15 193 lb 6.4 oz (87.726 kg)  12/28/14 187 lb (84.823 kg)  12/19/14  190 lb (86.183 kg)     Other studies Reviewed: Additional studies/ records that were reviewed today include:. Review of the above records demonstrates:   Assessment and Plan:   1. Coronary Artery Disease: Stable. Stress myoview 12/28/14 with no ischemia. Continue aspirin, beta blocker and statin.   2. Hypertension: Controlled. Continue current therapy.   3. Hyperlipidemia: Managed by primary care. Well controlled. Last LDL 67 January 2016  4. Paroxysmal atrial fibrillation: Sinus today with PVCs. No recurrence of atrial fibrillation since his post-op period in 2011.   Current medicines are reviewed at length with the patient today.  The patient does not have concerns regarding medicines.  The following changes have been made:  no change  Labs/ tests ordered today include:  No orders of the defined types were placed in this encounter.    Disposition:   FU with me in 6 months  Signed, [name], MD 02/15/2015 4:45 PM  [facility] [address], [place], [place]  [address] Phone: [phone]; Fax: [phone]

## 2015-02-15 NOTE — Patient Instructions (Signed)
Medication Instructions:  Your physician recommends that you continue on your current medications as directed. Please refer to the Current Medication list given to you today.   Labwork: none  Testing/Procedures: none  Follow-Up: Your physician wants you to follow-up in: 6 months.  You will receive a reminder letter in the mail two months in advance. If you don't receive a letter, please call our office to schedule the follow-up appointment.       

## 2015-03-08 DIAGNOSIS — H6122 Impacted cerumen, left ear: Secondary | ICD-10-CM | POA: Diagnosis not present

## 2015-05-26 DIAGNOSIS — M545 Low back pain: Secondary | ICD-10-CM | POA: Diagnosis not present

## 2015-06-04 DIAGNOSIS — M8580 Other specified disorders of bone density and structure, unspecified site: Secondary | ICD-10-CM | POA: Diagnosis not present

## 2015-06-04 DIAGNOSIS — I10 Essential (primary) hypertension: Secondary | ICD-10-CM | POA: Diagnosis not present

## 2015-06-04 DIAGNOSIS — D81818 Other biotin-dependent carboxylase deficiency: Secondary | ICD-10-CM | POA: Diagnosis not present

## 2015-06-04 DIAGNOSIS — E559 Vitamin D deficiency, unspecified: Secondary | ICD-10-CM | POA: Diagnosis not present

## 2015-06-04 DIAGNOSIS — E785 Hyperlipidemia, unspecified: Secondary | ICD-10-CM | POA: Diagnosis not present

## 2015-06-11 DIAGNOSIS — I2510 Atherosclerotic heart disease of native coronary artery without angina pectoris: Secondary | ICD-10-CM | POA: Diagnosis not present

## 2015-06-11 DIAGNOSIS — F17211 Nicotine dependence, cigarettes, in remission: Secondary | ICD-10-CM | POA: Diagnosis not present

## 2015-06-11 DIAGNOSIS — I129 Hypertensive chronic kidney disease with stage 1 through stage 4 chronic kidney disease, or unspecified chronic kidney disease: Secondary | ICD-10-CM | POA: Diagnosis not present

## 2015-06-11 DIAGNOSIS — I4891 Unspecified atrial fibrillation: Secondary | ICD-10-CM | POA: Diagnosis not present

## 2015-06-11 DIAGNOSIS — E785 Hyperlipidemia, unspecified: Secondary | ICD-10-CM | POA: Diagnosis not present

## 2015-07-20 ENCOUNTER — Telehealth: Payer: Self-pay | Admitting: Cardiovascular Disease

## 2015-07-20 NOTE — Telephone Encounter (Signed)
Spoke with pt's wife. Pt and wife are on bus trip returning from [place], [place].  They left for trip this past Saturday. Wife reports bus stops every 2-3 hours and pt gets out and walks around.  Has been sitting a lot on trip. Swelling started on Wednesday evening.  Swelling worsens as day goes on. Pt has never had swelling before.  Swelling in both ankles and up to back of calves. Feet not swollen and he is able to put shoes on without a problem.  Pt reports swelling is uncomfortable.  No specific area more swollen or painful.  No shortness of breath.  Unable to elevate legs due to riding on bus.  Has been eating out all week.  I told pt's wife this could be due to increased salt intake from eating out and sitting more than usual but also could not rule out possibility of blood clot without pt being evaluated.  They are expected to arrive home around 6 PM this evening and I told wife pt should go to urgent care to have this evaluated. Wife is agreeable with this plan.

## 2015-07-20 NOTE — Telephone Encounter (Signed)
New message      Pt c/o swelling: STAT is pt has developed SOB within 24 hours  1. How long have you been experiencing swelling? approx 3 days 2. Where is the swelling located? Feet and ankles  3.  Are you currently taking a "fluid pill"? no  4.  Are you currently SOB? no  5.  Have you traveled recently? Pt is on a bus trip since last sat. Prior to bus trip, no swelling. Pt is headed home.  They are scheduled to be home around 6 tonight

## 2015-07-23 NOTE — Telephone Encounter (Signed)
Spoke with pt's wife. Urgent Care was closed by the time they returned on Friday so pt has not been evaluated.  Pt's wife reports pt has been keeping feet elevated and swelling is a lot better.  Not completely gone but greatly improved. Legs no longer painful.  They will continue to monitor and let us know if swelling returns.

## 2015-07-23 NOTE — Telephone Encounter (Signed)
Thanks Pat. Chris  

## 2015-07-23 NOTE — Telephone Encounter (Signed)
Pat, Can we follow up to see how he is doing? Thanks, chris

## 2015-08-30 ENCOUNTER — Telehealth: Payer: Self-pay | Admitting: Cardiovascular Disease

## 2015-08-30 ENCOUNTER — Ambulatory Visit (INDEPENDENT_AMBULATORY_CARE_PROVIDER_SITE_OTHER): Payer: Medicare Other | Admitting: Cardiovascular Disease

## 2015-08-30 ENCOUNTER — Encounter: Payer: Self-pay | Admitting: Cardiovascular Disease

## 2015-08-30 VITALS — BP 120/78 | HR 64 | Ht 73.0 in | Wt 187.8 lb

## 2015-08-30 DIAGNOSIS — I10 Essential (primary) hypertension: Secondary | ICD-10-CM

## 2015-08-30 DIAGNOSIS — E785 Hyperlipidemia, unspecified: Secondary | ICD-10-CM

## 2015-08-30 DIAGNOSIS — I480 Paroxysmal atrial fibrillation: Secondary | ICD-10-CM

## 2015-08-30 DIAGNOSIS — I25709 Atherosclerosis of coronary artery bypass graft(s), unspecified, with unspecified angina pectoris: Secondary | ICD-10-CM

## 2015-08-30 MED ORDER — ROSUVASTATIN CALCIUM 20 MG PO TABS: 20.0000 mg | ORAL_TABLET | Freq: Every day | ORAL | Status: DC

## 2015-08-30 NOTE — Patient Instructions (Addendum)
Medication Instructions:  Take Rosuvastatin every day   Labwork: none  Testing/Procedures: none  Follow-Up: Your physician wants you to follow-up in: 12 months.  You will receive a reminder letter in the mail two months in advance. If you don't receive a letter, please call our office to schedule the follow-up appointment.   Any Other Special Instructions Will Be Listed Below (If Applicable).     If you need a refill on your cardiac medications before your next appointment, please call your pharmacy.

## 2015-08-30 NOTE — Progress Notes (Signed)
Chief Complaint  Patient presents with  . Follow-up    CAD ,PAF     History of Present Illness: 74 yo male with history of CAD, HTN, HLD here today for follow up.  Patient was admitted 4/11 with a non-STEMI. He subsequently underwent CABG. Postop course was complicated by atrial fibrillation. Echo 4/11: EF 55-60%, mildly dilated RV. Followup with monitor was negative for recurrent atrial fibrillation.  Coumadin was discontinued. Stress myoview February 2016 with no ischemia. PVCs noted on EKG during stress.   He is here today for follow up. No exertional chest pains or SOB.  Feeling well. Occasional palpitations.   Primary Care Physician: Trilby Drummer  Last Lipid Profile: Followed in primary care.   Past Medical History  Diagnosis Date  . Paroxysmal atrial fibrillation (HCC)   . Pleural effusion   . Coronary artery disease   . Hyperlipidemia   . Hypertension   . GERD (gastroesophageal reflux disease)   . Hx of cardiovascular stress test     a. ETT-MV 1/14: no ischemia; not gated    Past Surgical History  Procedure Laterality Date  . Coronary artery bypass graft  02-13-10    2V (LIMA to LAD, SVG to diagonal)  . Cholecystectomy    . Exploratory laparotomy      for perforated duodenal ulcer  . Hydrocelectomy    . Colonoscopy N/A 12/01/2013    Procedure: COLONOSCOPY;  Surgeon: Rogene Houston, MD;  [facility];  Service: Endoscopy;  Laterality: N/A;  830    Current Outpatient Prescriptions  Medication Sig Dispense Refill  . aspirin EC 81 MG tablet Take 81 mg by mouth daily.    . B Complex-Folic Acid (B COMPLEX-VITAMIN B12 PO) Take 1 tablet by mouth daily.      . calcium carbonate (OS-CAL) 600 MG TABS Take 600 mg by mouth daily.     . metoprolol (LOPRESSOR) 25 MG tablet Take one tablet by mouth twice daily. 180 tablet 3  . omeprazole (PRILOSEC) 40 MG capsule Take 40 mg by mouth daily.    . rosuvastatin (CRESTOR) 20 MG tablet Take 1 tablet (20 mg total) by  mouth daily. 90 tablet 3  . nitroGLYCERIN (NITROSTAT) 0.4 MG SL tablet Place 1 tablet (0.4 mg total) under the tongue every 5 (five) minutes as needed for chest pain. 25 tablet 11   No current facility-administered medications for this visit.    Allergies  Allergen Reactions  . Oxytetracycline Other (See Comments)    Passes out.    Social History   Social History  . Marital Status: Married    Spouse Name: N/A  . Number of Children: 2  . Years of Education: N/A   Occupational History  . retirede    Social History Main Topics  . Smoking status: Former Smoker -- 0.25 packs/day for 1 years    Types: Cigarettes  . Smokeless tobacco: Not on file  . Alcohol Use: No  . Drug Use: No  . Sexual Activity: Not on file   Other Topics Concern  . Not on file   Social History Narrative    Family History  Problem Relation Age of Onset  . Other Mother     alive- healthy  . Stroke Father     deceased  . Other Brother     CABG  . Colon cancer Neg Hx     Review of Systems:  As stated in the HPI and otherwise negative.   BP  120/78 mmHg  Pulse 64  Ht 6\' 1"  (1.854 m)  Wt 187 lb 12.8 oz (85.186 kg)  BMI 24.78 kg/m2  SpO2 98%  Physical Examination: General: Well developed, well nourished, NAD HEENT: OP clear, mucus membranes moist SKIN: warm, dry. No rashes. Neuro: No focal deficits Musculoskeletal: Muscle strength 5/5 all ext Psychiatric: Mood and affect normal Neck: No JVD, no carotid bruits, no thyromegaly, no lymphadenopathy. Lungs:Clear bilaterally, no wheezes, rhonci, crackles Cardiovascular: Regular rate and rhythm. No murmurs, gallops or rubs. Abdomen:Soft. Bowel sounds present. Non-tender.  Extremities: No lower extremity edema. Pulses are 2 + in the bilateral DP/PT.  Stress myoview 12/28/14: Stress Procedure: The patient received IV Lexiscan 0.4 mg over 15-seconds with concurrent low level exercise and then Technetium 57m Sestamibi was injected at 30-seconds  while the patient continued walking one more minute. Quantitative spect images were obtained after a 45-minute delay. Attempted to walk patient on Bruce protocol but his pressure in the first stage was 179/108. Switched to low level Lexiscan. Patient complained of slight lightheadedness that resolved in recovery. Stress ECG: No significant ST segment change suggestive of ischemia. and There are scattered PVCs. There is accentuation of nonspecific T-wave change in aVL during stress. QPS Raw Data Images: Mild diaphragmatic attenuation. Normal left ventricular size. Stress Images: There is subtle decreased uptake seen at both rest and stress in the basal inferior distribution consistent with diaphragmatic attenuation. Otherwise, homogeneous radiotracer uptake at both rest and stress. Rest Images: As above Subtraction (SDS): No evidence of ischemia. Transient Ischemic Dilatation (Normal <1.22): 1.12 Lung/Heart Ratio (Normal <0.45): 0.22 Quantitative Gated Spect Images QGS EDV: 105 ml QGS ESV: 47 ml Impression Exercise Capacity: Lexiscan with low level exercise. BP Response: Normal blood pressure response. Clinical Symptoms: Slight lightheadedness during exercise. ECG Impression: No significant ST segment change suggestive of ischemia. Comparison with Prior Nuclear Study: No images to compare Overall Impression: Low risk stress nuclear study with no evidence of ischemia identified.. LV Ejection Fraction: 55%. LV Wall Motion: NL LV Function; NL Wall Motion  EKG:  EKG is not ordered today. The ekg ordered today demonstrates  Recent Labs: No results found for requested labs within last 365 days.     Wt Readings from Last 3 Encounters:  08/30/15 187 lb 12.8 oz (85.186 kg)  02/15/15 193 lb 6.4 oz (87.726 kg)  12/28/14 187 lb (84.823 kg)     Other studies Reviewed: Additional studies/ records that were reviewed today include:. Review of the above records demonstrates:    Assessment and Plan:   1. Coronary Artery Disease: Stable. Stress myoview 12/28/14 with no ischemia. Continue aspirin, beta blocker and statin.   2. Hypertension: Controlled. Continue current therapy.   3. Hyperlipidemia: Managed by primary care. Well controlled. Last LDL 67 January 2016. Most recent testing not available to me today.   4. Paroxysmal atrial fibrillation: Sinus today. No recurrence of atrial fibrillation since his post-op period in 2011.   Current medicines are reviewed at length with the patient today.  The patient does not have concerns regarding medicines.  The following changes have been made:  no change  Labs/ tests ordered today include:  No orders of the defined types were placed in this encounter.    Disposition:   FU with me in 6 months  Signed, [name], MD 08/30/2015 9:59 AM    [facility] [place], [place], [place]  [address] Phone: [phone]; Fax: [phone]

## 2015-08-30 NOTE — Telephone Encounter (Signed)
New message     Talk to the nurse regarding getting on mychart.  She is not sure if pt is set up.   Her email address (if you need it) [email or web address].  Please call and let her know how to set up mychart

## 2015-08-30 NOTE — Telephone Encounter (Signed)
Spoke with pt's wife. She has AVS from today with my chart instructions and code.  I explained to her how to set up my chart.

## 2015-08-31 ENCOUNTER — Telehealth: Payer: Self-pay | Admitting: Cardiovascular Disease

## 2015-08-31 NOTE — Telephone Encounter (Signed)
Walk in pt form-Lab results dropped off Pat back Tuesday 11/1 will give to her then.

## 2015-09-04 ENCOUNTER — Encounter: Payer: Self-pay | Admitting: Cardiovascular Disease

## 2015-09-21 DIAGNOSIS — L218 Other seborrheic dermatitis: Secondary | ICD-10-CM | POA: Diagnosis not present

## 2015-09-21 DIAGNOSIS — L820 Inflamed seborrheic keratosis: Secondary | ICD-10-CM | POA: Diagnosis not present

## 2015-10-22 DIAGNOSIS — W57XXXA Bitten or stung by nonvenomous insect and other nonvenomous arthropods, initial encounter: Secondary | ICD-10-CM | POA: Diagnosis not present

## 2015-10-22 DIAGNOSIS — D485 Neoplasm of uncertain behavior of skin: Secondary | ICD-10-CM | POA: Diagnosis not present

## 2015-10-22 DIAGNOSIS — L309 Dermatitis, unspecified: Secondary | ICD-10-CM | POA: Diagnosis not present

## 2015-12-12 DIAGNOSIS — E559 Vitamin D deficiency, unspecified: Secondary | ICD-10-CM | POA: Diagnosis not present

## 2015-12-12 DIAGNOSIS — Z125 Encounter for screening for malignant neoplasm of prostate: Secondary | ICD-10-CM | POA: Diagnosis not present

## 2015-12-12 DIAGNOSIS — I129 Hypertensive chronic kidney disease with stage 1 through stage 4 chronic kidney disease, or unspecified chronic kidney disease: Secondary | ICD-10-CM | POA: Diagnosis not present

## 2015-12-12 DIAGNOSIS — I4891 Unspecified atrial fibrillation: Secondary | ICD-10-CM | POA: Diagnosis not present

## 2015-12-12 DIAGNOSIS — M8580 Other specified disorders of bone density and structure, unspecified site: Secondary | ICD-10-CM | POA: Diagnosis not present

## 2015-12-12 DIAGNOSIS — E538 Deficiency of other specified B group vitamins: Secondary | ICD-10-CM | POA: Diagnosis not present

## 2015-12-15 DIAGNOSIS — J020 Streptococcal pharyngitis: Secondary | ICD-10-CM | POA: Diagnosis not present

## 2015-12-15 DIAGNOSIS — J101 Influenza due to other identified influenza virus with other respiratory manifestations: Secondary | ICD-10-CM | POA: Diagnosis not present

## 2015-12-20 DIAGNOSIS — H43811 Vitreous degeneration, right eye: Secondary | ICD-10-CM | POA: Diagnosis not present

## 2015-12-20 DIAGNOSIS — H5203 Hypermetropia, bilateral: Secondary | ICD-10-CM | POA: Diagnosis not present

## 2015-12-20 DIAGNOSIS — H524 Presbyopia: Secondary | ICD-10-CM | POA: Diagnosis not present

## 2015-12-20 DIAGNOSIS — H52222 Regular astigmatism, left eye: Secondary | ICD-10-CM | POA: Diagnosis not present

## 2015-12-21 DIAGNOSIS — F17211 Nicotine dependence, cigarettes, in remission: Secondary | ICD-10-CM | POA: Diagnosis not present

## 2015-12-21 DIAGNOSIS — D696 Thrombocytopenia, unspecified: Secondary | ICD-10-CM | POA: Diagnosis not present

## 2015-12-21 DIAGNOSIS — I129 Hypertensive chronic kidney disease with stage 1 through stage 4 chronic kidney disease, or unspecified chronic kidney disease: Secondary | ICD-10-CM | POA: Diagnosis not present

## 2015-12-21 DIAGNOSIS — N182 Chronic kidney disease, stage 2 (mild): Secondary | ICD-10-CM | POA: Diagnosis not present

## 2015-12-21 DIAGNOSIS — I4891 Unspecified atrial fibrillation: Secondary | ICD-10-CM | POA: Diagnosis not present

## 2015-12-25 DIAGNOSIS — D485 Neoplasm of uncertain behavior of skin: Secondary | ICD-10-CM | POA: Diagnosis not present

## 2015-12-25 DIAGNOSIS — L821 Other seborrheic keratosis: Secondary | ICD-10-CM | POA: Diagnosis not present

## 2015-12-25 DIAGNOSIS — L820 Inflamed seborrheic keratosis: Secondary | ICD-10-CM | POA: Diagnosis not present

## 2016-01-15 ENCOUNTER — Telehealth: Payer: Self-pay | Admitting: Cardiovascular Disease

## 2016-01-15 NOTE — Telephone Encounter (Signed)
Spoke with pt's wife. She reports pt had episode of dizziness last night and heart fluttering. Not sure how long fluttering lasted but pt slept OK.  This morning had sharp pain after breakfast.   I then spoke with pt. He reports noticing increased fluttery feeling in chest for the last couple of days. This comes and goes. He is not having it at this time. He reports pain this morning was sharp and "like a snap".  He also reports tightness in chest for last few days. Reports it may be a pulled muscle but he is not sure. Reports pain fades in and out and is worse at times.  Was raking leaves the other day but is vague if pain started before or after he raked leaves. He thinks he needs to be seen in office. Will review with DOD

## 2016-01-15 NOTE — Telephone Encounter (Signed)
thanks

## 2016-01-15 NOTE — Telephone Encounter (Signed)
Reviewed with Dr. Radford Pax and pt should be added to DOD schedule tomorrow. I spoke with pt and appt made for him to see Dr. Beau Fanny on 01/16/16 at 10:45.  Pt instructed to go to ED if symptoms worsen prior to this appt. Pt reports he has NTG to use if needed but has not had to use for this tightness.

## 2016-01-15 NOTE — Telephone Encounter (Signed)
New Message:   Pt is having flutters,needs to be seen.

## 2016-01-16 ENCOUNTER — Ambulatory Visit (INDEPENDENT_AMBULATORY_CARE_PROVIDER_SITE_OTHER): Payer: Medicare Other | Admitting: Interventional Cardiology

## 2016-01-16 ENCOUNTER — Encounter: Payer: Self-pay | Admitting: Interventional Cardiology

## 2016-01-16 VITALS — BP 120/80 | HR 57 | Ht 73.0 in | Wt 188.4 lb

## 2016-01-16 DIAGNOSIS — I25810 Atherosclerosis of coronary artery bypass graft(s) without angina pectoris: Secondary | ICD-10-CM

## 2016-01-16 DIAGNOSIS — R42 Dizziness and giddiness: Secondary | ICD-10-CM | POA: Diagnosis not present

## 2016-01-16 DIAGNOSIS — I480 Paroxysmal atrial fibrillation: Secondary | ICD-10-CM | POA: Diagnosis not present

## 2016-01-16 DIAGNOSIS — R002 Palpitations: Secondary | ICD-10-CM | POA: Diagnosis not present

## 2016-01-16 NOTE — Patient Instructions (Signed)
**Note De-Identified Dinara Lupu Obfuscation** Medication Instructions:  May take over the counter Tylenol. No changes in your other medications.  Labwork: None  Testing/Procedures: None  Follow-Up: Your physician recommends that you schedule a follow-up appointment in: as previously planned with Dr Angelena Form.    If you need a refill on your cardiac medications before your next appointment, please call your pharmacy.

## 2016-01-16 NOTE — Progress Notes (Signed)
Patient ID: Ruben Burton, male   DOB: 07/05/1941, 75 y.o.   MRN: [id number]     Cardiology Office Note   Date:  01/16/2016   ID:  Ruben Burton, DOB 02/23/1941, MRN [id number]  PCP:  Thressa Sheller, MD    No chief complaint on file. chest pain   Wt Readings from Last 3 Encounters:  01/16/16 188 lb 6.4 oz (85.458 kg)  08/30/15 187 lb 12.8 oz (85.186 kg)  02/15/15 193 lb 6.4 oz (87.726 kg)       History of Present Illness: Ruben Burton is a 75 y.o. male  with a history of coronary artery disease. He had bypass surgery in April 2011. He had postoperative atrial fibrillation. His last stress test was in February 2016 which showed no ischemia. He did have PVCs.  Over the past few days, he has had some discomfort in his chest. It does not feel like his prior MI in April 2011. He has been working in the yard. He also picked up his 50 pound grandson. He feels like he strained a muscle in his chest. When he walks, he does not have any discomfort in his chest. It only occurs when he is lifting something.  He is also noted some fluttering feelings in his chest. They're very short-lived. There is no associated lightheadedness. He has been noted to have PVCs in the past.  He feels some dizziness when he goes from bending down to standing up. He has not fallen or passed out.    Past Medical History  Diagnosis Date  . Paroxysmal atrial fibrillation (HCC)   . Pleural effusion   . Coronary artery disease   . Hyperlipidemia   . Hypertension   . GERD (gastroesophageal reflux disease)   . Hx of cardiovascular stress test     a. ETT-MV 1/14: no ischemia; not gated    Past Surgical History  Procedure Laterality Date  . Coronary artery bypass graft  02-13-10    2V (LIMA to LAD, SVG to diagonal)  . Cholecystectomy    . Exploratory laparotomy      for perforated duodenal ulcer  . Hydrocelectomy    . Colonoscopy N/A 12/01/2013    Procedure: COLONOSCOPY;  Surgeon: Rogene Houston, MD;   [facility];  Service: Endoscopy;  Laterality: N/A;  830     Current Outpatient Prescriptions  Medication Sig Dispense Refill  . aspirin EC 81 MG tablet Take 81 mg by mouth daily.    . B Complex-Folic Acid (B COMPLEX-VITAMIN B12 PO) Take 1 tablet by mouth daily.      . calcium carbonate (OS-CAL) 600 MG TABS Take 600 mg by mouth daily.     . metoprolol (LOPRESSOR) 25 MG tablet Take one tablet by mouth twice daily. 180 tablet 3  . nitroGLYCERIN (NITROSTAT) 0.4 MG SL tablet Place 1 tablet (0.4 mg total) under the tongue every 5 (five) minutes as needed for chest pain. 25 tablet 11  . omeprazole (PRILOSEC) 40 MG capsule Take 40 mg by mouth daily.    . rosuvastatin (CRESTOR) 20 MG tablet Take 1 tablet (20 mg total) by mouth daily. 90 tablet 3   No current facility-administered medications for this visit.    Allergies:   Oxytetracycline    Social History:  The patient  reports that he has quit smoking. His smoking use included Cigarettes. He has a .25 pack-year smoking history. He does not have any smokeless tobacco history on file. He reports that  he does not drink alcohol or use illicit drugs.   Family History:  The patient's family history includes Heart attack in his brother; Hypertension in his brother, father, and mother; Other in his brother and mother; Stroke in his father. There is no history of Colon cancer.    ROS:  Please see the history of present illness.   Otherwise, review of systems are positive for chest discomfort and palpitations.   All other systems are reviewed and negative.    PHYSICAL EXAM: VS:  BP 120/80 mmHg  Pulse 57  Ht 6\' 1"  (1.854 m)  Wt 188 lb 6.4 oz (85.458 kg)  BMI 24.86 kg/m2 , BMI Body mass index is 24.86 kg/(m^2). GEN: Well nourished, well developed, in no acute distress HEENT: normal Neck: no JVD, carotid bruits, or masses Cardiac: RRR, occasional premature beats; no murmurs, rubs, or gallops,no edema ; minimal pain to palpation of the  chest Respiratory:  clear to auscultation bilaterally, normal work of breathing GI: soft, nontender, nondistended, + BS MS: no deformity or atrophy Skin: warm and dry, no rash Neuro:  Strength and sensation are intact Psych: euthymic mood, full affect   EKG:   The ekg ordered today demonstrates sinus bradycardia, no significant ST segment changes   Recent Labs: No results found for requested labs within last 365 days.   Lipid Panel    Component Value Date/Time   CHOL 116 05/14/2010 0908   TRIG 141.0 05/14/2010 0908   HDL 32.80* 05/14/2010 0908   CHOLHDL 4 05/14/2010 0908   VLDL 28.2 05/14/2010 0908   LDLCALC 55 05/14/2010 0908     Other studies Reviewed: Additional studies/ records that were reviewed today with results demonstrating: 2016 stress test result reviewed.   ASSESSMENT AND PLAN:  1. Chest pain: Sounds more atypical. It is not like his prior MI. I think this is most likely be musculoskeletal. He'll try acetaminophen over-the-counter. If he has any symptoms which feel like his prior angina, he will let us know.  Would have to consider angiography if those type of symptoms recurred. 2. Palpitations: He had a negative monitor after his bypass surgery showing no recurrent atrial fibrillation. He is not describing sustained palpitations. He may be having some symptomatic PVCs. He did have premature beats during the exam today and did not notice them. Continue to monitor. He feels that these palpitations occur more when he is doing physical labor. He may just be feeling sinus tachycardia. 3. Lightheadedness with change in position.  No syncope.  I counseled him to change positions slowly. He needs to stay well hydrated as well.   Current medicines are reviewed at length with the patient today.  The patient concerns regarding his medicines were addressed.  The following changes have been made:  No change  Labs/ tests ordered today include:  No orders of the defined types  were placed in this encounter.    Recommend 150 minutes/week of aerobic exercise Low fat, low carb, high fiber diet recommended  Disposition:   FU as scheduled with Dr. Storm Frisk., MD  01/16/2016 11:17 AM    [facility] [place], [place], [place]  [address] Phone: [phone]; Fax: [phone]

## 2016-01-21 ENCOUNTER — Encounter: Payer: Self-pay | Admitting: Cardiovascular Disease

## 2016-01-22 ENCOUNTER — Other Ambulatory Visit: Payer: Self-pay | Admitting: *Deleted

## 2016-01-22 MED ORDER — ROSUVASTATIN CALCIUM 20 MG PO TABS: 20.0000 mg | ORAL_TABLET | Freq: Every day | ORAL | Status: DC

## 2016-03-17 DIAGNOSIS — M545 Low back pain: Secondary | ICD-10-CM | POA: Diagnosis not present

## 2016-03-28 DIAGNOSIS — N390 Urinary tract infection, site not specified: Secondary | ICD-10-CM | POA: Diagnosis not present

## 2016-04-01 DIAGNOSIS — R319 Hematuria, unspecified: Secondary | ICD-10-CM | POA: Diagnosis not present

## 2016-04-02 DIAGNOSIS — D1801 Hemangioma of skin and subcutaneous tissue: Secondary | ICD-10-CM | POA: Diagnosis not present

## 2016-04-02 DIAGNOSIS — D225 Melanocytic nevi of trunk: Secondary | ICD-10-CM | POA: Diagnosis not present

## 2016-04-02 DIAGNOSIS — L219 Seborrheic dermatitis, unspecified: Secondary | ICD-10-CM | POA: Diagnosis not present

## 2016-04-02 DIAGNOSIS — D485 Neoplasm of uncertain behavior of skin: Secondary | ICD-10-CM | POA: Diagnosis not present

## 2016-04-02 DIAGNOSIS — L820 Inflamed seborrheic keratosis: Secondary | ICD-10-CM | POA: Diagnosis not present

## 2016-04-08 DIAGNOSIS — R350 Frequency of micturition: Secondary | ICD-10-CM | POA: Diagnosis not present

## 2016-04-08 DIAGNOSIS — R3121 Asymptomatic microscopic hematuria: Secondary | ICD-10-CM | POA: Diagnosis not present

## 2016-04-08 DIAGNOSIS — R1084 Generalized abdominal pain: Secondary | ICD-10-CM | POA: Diagnosis not present

## 2016-04-08 DIAGNOSIS — N400 Enlarged prostate without lower urinary tract symptoms: Secondary | ICD-10-CM | POA: Diagnosis not present

## 2016-04-11 DIAGNOSIS — R3121 Asymptomatic microscopic hematuria: Secondary | ICD-10-CM | POA: Diagnosis not present

## 2016-04-11 DIAGNOSIS — R1084 Generalized abdominal pain: Secondary | ICD-10-CM | POA: Diagnosis not present

## 2016-04-11 DIAGNOSIS — R3129 Other microscopic hematuria: Secondary | ICD-10-CM | POA: Diagnosis not present

## 2016-04-17 DIAGNOSIS — M6701 Short Achilles tendon (acquired), right ankle: Secondary | ICD-10-CM | POA: Diagnosis not present

## 2016-04-17 DIAGNOSIS — Q667 Congenital pes cavus: Secondary | ICD-10-CM | POA: Diagnosis not present

## 2016-04-17 DIAGNOSIS — S96912A Strain of unspecified muscle and tendon at ankle and foot level, left foot, initial encounter: Secondary | ICD-10-CM | POA: Diagnosis not present

## 2016-04-18 DIAGNOSIS — R3121 Asymptomatic microscopic hematuria: Secondary | ICD-10-CM | POA: Diagnosis not present

## 2016-05-14 DIAGNOSIS — M47816 Spondylosis without myelopathy or radiculopathy, lumbar region: Secondary | ICD-10-CM | POA: Diagnosis not present

## 2016-05-14 DIAGNOSIS — S338XXA Sprain of other parts of lumbar spine and pelvis, initial encounter: Secondary | ICD-10-CM | POA: Diagnosis not present

## 2016-05-14 DIAGNOSIS — M545 Low back pain: Secondary | ICD-10-CM | POA: Diagnosis not present

## 2016-05-14 DIAGNOSIS — M9903 Segmental and somatic dysfunction of lumbar region: Secondary | ICD-10-CM | POA: Diagnosis not present

## 2016-05-27 DIAGNOSIS — M47816 Spondylosis without myelopathy or radiculopathy, lumbar region: Secondary | ICD-10-CM | POA: Diagnosis not present

## 2016-05-27 DIAGNOSIS — M545 Low back pain: Secondary | ICD-10-CM | POA: Diagnosis not present

## 2016-05-27 DIAGNOSIS — M9903 Segmental and somatic dysfunction of lumbar region: Secondary | ICD-10-CM | POA: Diagnosis not present

## 2016-05-27 DIAGNOSIS — S338XXA Sprain of other parts of lumbar spine and pelvis, initial encounter: Secondary | ICD-10-CM | POA: Diagnosis not present

## 2016-05-30 DIAGNOSIS — M545 Low back pain: Secondary | ICD-10-CM | POA: Diagnosis not present

## 2016-05-30 DIAGNOSIS — Q667 Congenital pes cavus: Secondary | ICD-10-CM | POA: Diagnosis not present

## 2016-05-30 DIAGNOSIS — M6701 Short Achilles tendon (acquired), right ankle: Secondary | ICD-10-CM | POA: Diagnosis not present

## 2016-06-09 DIAGNOSIS — M25531 Pain in right wrist: Secondary | ICD-10-CM | POA: Diagnosis not present

## 2016-06-10 DIAGNOSIS — M9903 Segmental and somatic dysfunction of lumbar region: Secondary | ICD-10-CM | POA: Diagnosis not present

## 2016-06-10 DIAGNOSIS — S338XXA Sprain of other parts of lumbar spine and pelvis, initial encounter: Secondary | ICD-10-CM | POA: Diagnosis not present

## 2016-06-10 DIAGNOSIS — M545 Low back pain: Secondary | ICD-10-CM | POA: Diagnosis not present

## 2016-06-10 DIAGNOSIS — M47816 Spondylosis without myelopathy or radiculopathy, lumbar region: Secondary | ICD-10-CM | POA: Diagnosis not present

## 2016-06-12 DIAGNOSIS — I129 Hypertensive chronic kidney disease with stage 1 through stage 4 chronic kidney disease, or unspecified chronic kidney disease: Secondary | ICD-10-CM | POA: Diagnosis not present

## 2016-06-12 DIAGNOSIS — E559 Vitamin D deficiency, unspecified: Secondary | ICD-10-CM | POA: Diagnosis not present

## 2016-06-12 DIAGNOSIS — I10 Essential (primary) hypertension: Secondary | ICD-10-CM | POA: Diagnosis not present

## 2016-06-12 DIAGNOSIS — E538 Deficiency of other specified B group vitamins: Secondary | ICD-10-CM | POA: Diagnosis not present

## 2016-06-12 DIAGNOSIS — M8580 Other specified disorders of bone density and structure, unspecified site: Secondary | ICD-10-CM | POA: Diagnosis not present

## 2016-06-12 DIAGNOSIS — E785 Hyperlipidemia, unspecified: Secondary | ICD-10-CM | POA: Diagnosis not present

## 2016-06-19 ENCOUNTER — Other Ambulatory Visit: Payer: Self-pay | Admitting: Internal Medicine

## 2016-06-19 DIAGNOSIS — I480 Paroxysmal atrial fibrillation: Secondary | ICD-10-CM | POA: Diagnosis not present

## 2016-06-19 DIAGNOSIS — F17211 Nicotine dependence, cigarettes, in remission: Secondary | ICD-10-CM | POA: Diagnosis not present

## 2016-06-19 DIAGNOSIS — I700 Atherosclerosis of aorta: Secondary | ICD-10-CM | POA: Diagnosis not present

## 2016-06-19 DIAGNOSIS — R911 Solitary pulmonary nodule: Secondary | ICD-10-CM

## 2016-06-19 DIAGNOSIS — D696 Thrombocytopenia, unspecified: Secondary | ICD-10-CM | POA: Diagnosis not present

## 2016-06-19 DIAGNOSIS — I129 Hypertensive chronic kidney disease with stage 1 through stage 4 chronic kidney disease, or unspecified chronic kidney disease: Secondary | ICD-10-CM | POA: Diagnosis not present

## 2016-06-23 ENCOUNTER — Other Ambulatory Visit: Payer: Medicare Other

## 2016-07-14 ENCOUNTER — Ambulatory Visit
Admission: RE | Admit: 2016-07-14 | Discharge: 2016-07-14 | Disposition: A | Discharge status: Home / Self Care | Payer: Medicare Other | Source: Ambulatory Visit | Attending: Internal Medicine | Admitting: Internal Medicine

## 2016-07-14 DIAGNOSIS — M2550 Pain in unspecified joint: Secondary | ICD-10-CM | POA: Diagnosis not present

## 2016-07-14 DIAGNOSIS — R760 Raised antibody titer: Secondary | ICD-10-CM | POA: Diagnosis not present

## 2016-07-14 DIAGNOSIS — Z79899 Other long term (current) drug therapy: Secondary | ICD-10-CM | POA: Diagnosis not present

## 2016-07-14 DIAGNOSIS — R911 Solitary pulmonary nodule: Secondary | ICD-10-CM | POA: Diagnosis not present

## 2016-08-24 NOTE — Progress Notes (Signed)
Chief Complaint  Patient presents with  . Follow-up     History of Present Illness: 75 yo male with history of CAD, HTN, HLD here today for follow up.  Patient was admitted 4/11 with a non-STEMI. He subsequently underwent CABG. Postop course was complicated by atrial fibrillation. Echo 4/11: EF 55-60%, mildly dilated RV. Followup with monitor was negative for recurrent atrial fibrillation.  Coumadin was discontinued. Stress myoview February 2016 with no ischemia. PVCs noted on EKG during stress. He was seen by DR. Norfolk in our office March 2017 with c/o dizziness, palpitations and chest pain.   He is here today for follow up. No exertional chest pains or SOB.  Feeling well. Occasional palpitations.   Primary Care Physician: Thressa Sheller, MD    Past Medical History:  Diagnosis Date  . Coronary artery disease   . GERD (gastroesophageal reflux disease)   . Hx of cardiovascular stress test    a. ETT-MV 1/14: no ischemia; not gated  . Hyperlipidemia   . Hypertension   . Paroxysmal atrial fibrillation (HCC)   . Pleural effusion     Past Surgical History:  Procedure Laterality Date  . CHOLECYSTECTOMY    . COLONOSCOPY N/A 12/01/2013   Procedure: COLONOSCOPY;  Surgeon: Rogene Houston, MD;  [facility];  Service: Endoscopy;  Laterality: N/A;  830  . CORONARY ARTERY BYPASS GRAFT  02-13-10   2V (LIMA to LAD, SVG to diagonal)  . EXPLORATORY LAPAROTOMY     for perforated duodenal ulcer  . hydrocelectomy      Current Outpatient Prescriptions  Medication Sig Dispense Refill  . aspirin EC 81 MG tablet Take 81 mg by mouth daily.    . B Complex-Folic Acid (B COMPLEX-VITAMIN B12 PO) Take 1 tablet by mouth daily.      . calcium carbonate (OS-CAL) 600 MG TABS Take 600 mg by mouth daily.     . metoprolol (LOPRESSOR) 25 MG tablet Take one tablet by mouth twice daily. 180 tablet 3  . NEXIUM 40 MG capsule Take 1 tablet by mouth daily.    . rosuvastatin (CRESTOR) 20 MG tablet  Take 1 tablet (20 mg total) by mouth daily. 90 tablet 3  . tamsulosin (FLOMAX) 0.4 MG CAPS capsule Take 1 capsule by mouth daily.    . nitroGLYCERIN (NITROSTAT) 0.4 MG SL tablet Place 1 tablet (0.4 mg total) under the tongue every 5 (five) minutes as needed for chest pain. 25 tablet 11   No current facility-administered medications for this visit.     Allergies  Allergen Reactions  . Oxytetracycline Other (See Comments)    Passes out.    Social History   Social History  . Marital status: Married    Spouse name: N/A  . Number of children: 2  . Years of education: N/A   Occupational History  . retirede Retired   Social History Main Topics  . Smoking status: Former Smoker    Packs/day: 0.25    Years: 1.00    Types: Cigarettes  . Smokeless tobacco: Not on file  . Alcohol use No  . Drug use: No  . Sexual activity: Not on file   Other Topics Concern  . Not on file   Social History Narrative  . No narrative on file    Family History  Problem Relation Age of Onset  . Other Mother     alive- healthy  . Stroke Father     deceased  . Other Brother  CABG  . Colon cancer Neg Hx   . Heart attack Brother   . Hypertension Mother   . Hypertension Father   . Hypertension Brother     Review of Systems:  As stated in the HPI and otherwise negative.   BP 124/64   Pulse 60   Ht 6\' 1"  (1.854 m)   Wt 191 lb (86.6 kg)   BMI 25.20 kg/m   Physical Examination: General: Well developed, well nourished, NAD  HEENT: OP clear, mucus membranes moist  SKIN: warm, dry. No rashes. Neuro: No focal deficits  Musculoskeletal: Muscle strength 5/5 all ext  Psychiatric: Mood and affect normal  Neck: No JVD, no carotid bruits, no thyromegaly, no lymphadenopathy.  Lungs:Clear bilaterally, no wheezes, rhonci, crackles Cardiovascular: Regular rate and rhythm. No murmurs, gallops or rubs. Abdomen:Soft. Bowel sounds present. Non-tender.  Extremities: No lower extremity edema. Pulses  are 2 + in the bilateral DP/PT.  Stress myoview 12/28/14: Stress Procedure: The patient received IV Lexiscan 0.4 mg over 15-seconds with concurrent low level exercise and then Technetium 64m Sestamibi was injected at 30-seconds while the patient continued walking one more minute. Quantitative spect images were obtained after a 45-minute delay. Attempted to walk patient on Bruce protocol but his pressure in the first stage was 179/108. Switched to low level Lexiscan. Patient complained of slight lightheadedness that resolved in recovery. Stress ECG: No significant ST segment change suggestive of ischemia. and There are scattered PVCs. There is accentuation of nonspecific T-wave change in aVL during stress. QPS Raw Data Images: Mild diaphragmatic attenuation. Normal left ventricular size. Stress Images: There is subtle decreased uptake seen at both rest and stress in the basal inferior distribution consistent with diaphragmatic attenuation. Otherwise, homogeneous radiotracer uptake at both rest and stress. Rest Images: As above Subtraction (SDS): No evidence of ischemia. Transient Ischemic Dilatation (Normal <1.22): 1.12 Lung/Heart Ratio (Normal <0.45): 0.22 Quantitative Gated Spect Images QGS EDV: 105 ml QGS ESV: 47 ml Impression Exercise Capacity: Lexiscan with low level exercise. BP Response: Normal blood pressure response. Clinical Symptoms: Slight lightheadedness during exercise. ECG Impression: No significant ST segment change suggestive of ischemia. Comparison with Prior Nuclear Study: No images to compare Overall Impression: Low risk stress nuclear study with no evidence of ischemia identified.. LV Ejection Fraction: 55%. LV Wall Motion: NL LV Function; NL Wall Motion  EKG:  EKG is not ordered today. The ekg ordered today demonstrates  Recent Labs: No results found for requested labs within last 8760 hours.     Wt Readings from Last 3 Encounters:  08/25/16  191 lb (86.6 kg)  01/16/16 188 lb 6.4 oz (85.5 kg)  08/30/15 187 lb 12.8 oz (85.2 kg)     Other studies Reviewed: Additional studies/ records that were reviewed today include:. Review of the above records demonstrates:   Assessment and Plan:   1. Coronary Artery Disease: No chest pain suggestive of angina. Stress myoview 12/28/14 with no ischemia. Continue aspirin, beta blocker and statin.   2. Hypertension: Controlled. Continue current therapy.   3. Hyperlipidemia: Managed by primary care.   4. Paroxysmal atrial fibrillation: Sinus today. No recurrence of atrial fibrillation since his post-op period in 2011.   Current medicines are reviewed at length with the patient today.  The patient does not have concerns regarding medicines.  The following changes have been made:  no change  Labs/ tests ordered today include:  No orders of the defined types were placed in this encounter.   Disposition:   FU  with me in 6 months  Signed, [name], MD 08/25/2016 12:13 PM    [facility], [place], [place]  [address] Phone: [phone]; Fax: [phone]

## 2016-08-25 ENCOUNTER — Ambulatory Visit (INDEPENDENT_AMBULATORY_CARE_PROVIDER_SITE_OTHER): Payer: Medicare Other | Admitting: Cardiovascular Disease

## 2016-08-25 ENCOUNTER — Encounter: Payer: Self-pay | Admitting: Cardiovascular Disease

## 2016-08-25 VITALS — BP 124/64 | HR 60 | Ht 73.0 in | Wt 191.0 lb

## 2016-08-25 DIAGNOSIS — I10 Essential (primary) hypertension: Secondary | ICD-10-CM | POA: Diagnosis not present

## 2016-08-25 DIAGNOSIS — E7800 Pure hypercholesterolemia, unspecified: Secondary | ICD-10-CM

## 2016-08-25 DIAGNOSIS — I480 Paroxysmal atrial fibrillation: Secondary | ICD-10-CM

## 2016-08-25 DIAGNOSIS — I25810 Atherosclerosis of coronary artery bypass graft(s) without angina pectoris: Secondary | ICD-10-CM | POA: Diagnosis not present

## 2016-08-25 NOTE — Patient Instructions (Signed)

## 2016-09-03 DIAGNOSIS — S338XXA Sprain of other parts of lumbar spine and pelvis, initial encounter: Secondary | ICD-10-CM | POA: Diagnosis not present

## 2016-09-03 DIAGNOSIS — M9903 Segmental and somatic dysfunction of lumbar region: Secondary | ICD-10-CM | POA: Diagnosis not present

## 2016-09-03 DIAGNOSIS — M47816 Spondylosis without myelopathy or radiculopathy, lumbar region: Secondary | ICD-10-CM | POA: Diagnosis not present

## 2016-09-03 DIAGNOSIS — M545 Low back pain: Secondary | ICD-10-CM | POA: Diagnosis not present

## 2016-09-04 ENCOUNTER — Encounter (HOSPITAL_COMMUNITY): Payer: Self-pay | Admitting: Emergency Medicine

## 2016-09-04 ENCOUNTER — Emergency Department (HOSPITAL_COMMUNITY)
Admission: EM | Admit: 2016-09-04 | Discharge: 2016-09-04 | Disposition: A | Discharge status: Home / Self Care | Payer: Medicare Other | Attending: Emergency Medicine | Admitting: Emergency Medicine

## 2016-09-04 DIAGNOSIS — Z951 Presence of aortocoronary bypass graft: Secondary | ICD-10-CM | POA: Insufficient documentation

## 2016-09-04 DIAGNOSIS — I2510 Atherosclerotic heart disease of native coronary artery without angina pectoris: Secondary | ICD-10-CM | POA: Diagnosis not present

## 2016-09-04 DIAGNOSIS — Z7982 Long term (current) use of aspirin: Secondary | ICD-10-CM | POA: Diagnosis not present

## 2016-09-04 DIAGNOSIS — W268XXA Contact with other sharp object(s), not elsewhere classified, initial encounter: Secondary | ICD-10-CM | POA: Insufficient documentation

## 2016-09-04 DIAGNOSIS — Z79899 Other long term (current) drug therapy: Secondary | ICD-10-CM | POA: Insufficient documentation

## 2016-09-04 DIAGNOSIS — Y999 Unspecified external cause status: Secondary | ICD-10-CM | POA: Insufficient documentation

## 2016-09-04 DIAGNOSIS — Z87891 Personal history of nicotine dependence: Secondary | ICD-10-CM | POA: Insufficient documentation

## 2016-09-04 DIAGNOSIS — S61411A Laceration without foreign body of right hand, initial encounter: Secondary | ICD-10-CM | POA: Diagnosis not present

## 2016-09-04 DIAGNOSIS — Y9389 Activity, other specified: Secondary | ICD-10-CM | POA: Insufficient documentation

## 2016-09-04 DIAGNOSIS — Y929 Unspecified place or not applicable: Secondary | ICD-10-CM | POA: Insufficient documentation

## 2016-09-04 DIAGNOSIS — I10 Essential (primary) hypertension: Secondary | ICD-10-CM | POA: Insufficient documentation

## 2016-09-04 MED ORDER — BACITRACIN-NEOMYCIN-POLYMYXIN 400-5-5000 EX OINT
TOPICAL_OINTMENT | Freq: Once | CUTANEOUS | Status: AC
  Administered 2016-09-04: 1 via TOPICAL
  Filled 2016-09-04: qty 1

## 2016-09-04 MED ORDER — LIDOCAINE HCL (PF) 1 % IJ SOLN
5.0000 mL | Freq: Once | INTRAMUSCULAR | Status: AC
  Administered 2016-09-04: 5 mL
  Filled 2016-09-04: qty 5

## 2016-09-04 NOTE — ED Notes (Signed)
Dressing placed on laceration, cms intact distal,

## 2016-09-04 NOTE — ED Provider Notes (Signed)
[facility] DEPT Provider Note   CSN: [id number] Arrival date & time: 09/04/16  1823     History   Chief Complaint Chief Complaint  Patient presents with  . Laceration    HPI VALOR KOPPES is a 75 y.o. male.  Patient with laceration on dorsal right hand from a plastic lid. No continued bleeding. No significant pain with movement.    The history is provided by the patient and the spouse. No language interpreter was used.  Laceration      Past Medical History:  Diagnosis Date  . Coronary artery disease   . GERD (gastroesophageal reflux disease)   . Hx of cardiovascular stress test    a. ETT-MV 1/14: no ischemia; not gated  . Hyperlipidemia   . Hypertension   . Paroxysmal atrial fibrillation (HCC)   . Pleural effusion     Patient Active Problem List   Diagnosis Date Noted  . HYPERLIPIDEMIA 03/07/2010  . HYPERTENSION 03/07/2010  . CAD, ARTERY BYPASS GRAFT 03/07/2010  . PAROXYSMAL ATRIAL FIBRILLATION 03/07/2010  . PLEURAL EFFUSION, LEFT 03/07/2010  . GERD 03/07/2010    Past Surgical History:  Procedure Laterality Date  . CHOLECYSTECTOMY    . COLONOSCOPY N/A 12/01/2013   Procedure: COLONOSCOPY;  Surgeon: Rogene Houston, MD;  Location: [facility];  Service: Endoscopy;  Laterality: N/A;  830  . CORONARY ARTERY BYPASS GRAFT  02-13-10   2V (LIMA to LAD, SVG to diagonal)  . EXPLORATORY LAPAROTOMY     for perforated duodenal ulcer  . hydrocelectomy         Home Medications    Prior to Admission medications   Medication Sig Start Date End Date Taking? Authorizing Provider  aspirin EC 81 MG tablet Take 81 mg by mouth daily.   Yes Historical Provider, MD  B Complex-Folic Acid (B COMPLEX-VITAMIN B12 PO) Take 1 tablet by mouth daily.     Yes Historical Provider, MD  calcium carbonate (OS-CAL) 600 MG TABS Take 600 mg by mouth daily.    Yes Historical Provider, MD  metoprolol (LOPRESSOR) 25 MG tablet Take one tablet by mouth twice daily. 05/09/11  Yes  Burnell Blanks, MD  NEXIUM 40 MG capsule Take 1 tablet by mouth daily. 08/18/16  Yes Historical Provider, MD  rosuvastatin (CRESTOR) 20 MG tablet Take 1 tablet (20 mg total) by mouth daily. 01/22/16  Yes Burnell Blanks, MD  tamsulosin (FLOMAX) 0.4 MG CAPS capsule Take 1 capsule by mouth daily. 07/07/16  Yes Historical Provider, MD  nitroGLYCERIN (NITROSTAT) 0.4 MG SL tablet Place 1 tablet (0.4 mg total) under the tongue every 5 (five) minutes as needed for chest pain. 01/10/14 01/16/16  Burnell Blanks, MD    Family History Family History  Problem Relation Age of Onset  . Other Mother     alive- healthy  . Hypertension Mother   . Stroke Father     deceased  . Hypertension Father   . Other Brother     CABG  . Heart attack Brother   . Hypertension Brother   . Colon cancer Neg Hx     Social History Social History  Substance Use Topics  . Smoking status: Former Smoker    Packs/day: 0.25    Years: 1.00    Types: Cigarettes  . Smokeless tobacco: Never Used  . Alcohol use No     Allergies   Oxytetracycline   Review of Systems Review of Systems  Constitutional: Negative for diaphoresis.  Skin: Positive for  wound.  Neurological: Negative for numbness.     Physical Exam Updated Vital Signs BP 158/84   Pulse 62   Temp 98.4 F (36.9 C)   Resp 21   Ht 6\' 1"  (1.854 m)   Wt 83.9 kg   SpO2 98%   BMI 24.41 kg/m   Physical Exam  Constitutional: He is oriented to person, place, and time. He appears well-developed and well-nourished.  Neck: Normal range of motion.  Pulmonary/Chest: Effort normal.  Musculoskeletal: Normal range of motion.  Neurological: He is alert and oriented to person, place, and time.  Skin: Skin is warm and dry.  2 cm laceration dorsal 2nd MCP joint. FROM. Laceration does not extend into joint.  Psychiatric: He has a normal mood and affect.     ED Treatments / Results  Labs (all labs ordered are listed, but only abnormal  results are displayed) Labs Reviewed - No data to display  EKG  EKG Interpretation None      LACERATION REPAIR Performed by: Charlann Lange A Authorized by: Charlann Lange A Consent: Verbal consent obtained. Risks and benefits: risks, benefits and alternatives were discussed Consent given by: patient Patient identity confirmed: provided demographic data Prepped and Draped in normal sterile fashion Wound explored  Laceration Location: right dorsal MCP  Laceration Length: 2 cm  No Foreign Bodies seen or palpated  Anesthesia: local infiltration  Local anesthetic: lidocaine 2% w/o epinephrine  Anesthetic total: 1 ml  Irrigation method: syringe Amount of cleaning: standard  Skin closure: 4-0 prolene  Number of sutures: 3  Technique: simple interrupted  Patient tolerance: Patient tolerated the procedure well with no immediate complications.  Radiology No results found.  Procedures Procedures (including critical care time)  Medications Ordered in ED Medications  lidocaine (PF) (XYLOCAINE) 1 % injection 5 mL (5 mLs Infiltration Given by Other 09/04/16 1938)     Initial Impression / Assessment and Plan / ED Course  I have reviewed the triage vital signs and the nursing notes.  Pertinent labs & imaging results that were available during my care of the patient were reviewed by me and considered in my medical decision making (see chart for details).  Clinical Course    Uncomplicated laceration to right hand.  Final Clinical Impressions(s) / ED Diagnoses   Final diagnoses:  None  1. Right hand laceration  New Prescriptions New Prescriptions   No medications on file     Charlann Lange, Hershal Coria 09/04/16 2009    Isla Pence, MD 09/04/16 [phone]

## 2016-09-04 NOTE — ED Triage Notes (Signed)
Pt states he cut his hand on a jar lid.

## 2016-09-04 NOTE — ED Notes (Signed)
Pt states that he cut his right index knuckle while opening a plastic jar tonight,  Bleeding controlled at present, PA in prior to RN, see pa assessment for further

## 2016-09-05 DIAGNOSIS — M47816 Spondylosis without myelopathy or radiculopathy, lumbar region: Secondary | ICD-10-CM | POA: Diagnosis not present

## 2016-09-05 DIAGNOSIS — M545 Low back pain: Secondary | ICD-10-CM | POA: Diagnosis not present

## 2016-09-05 DIAGNOSIS — S338XXA Sprain of other parts of lumbar spine and pelvis, initial encounter: Secondary | ICD-10-CM | POA: Diagnosis not present

## 2016-09-05 DIAGNOSIS — M9903 Segmental and somatic dysfunction of lumbar region: Secondary | ICD-10-CM | POA: Diagnosis not present

## 2016-09-08 DIAGNOSIS — S338XXA Sprain of other parts of lumbar spine and pelvis, initial encounter: Secondary | ICD-10-CM | POA: Diagnosis not present

## 2016-09-08 DIAGNOSIS — M47816 Spondylosis without myelopathy or radiculopathy, lumbar region: Secondary | ICD-10-CM | POA: Diagnosis not present

## 2016-09-08 DIAGNOSIS — M9903 Segmental and somatic dysfunction of lumbar region: Secondary | ICD-10-CM | POA: Diagnosis not present

## 2016-09-08 DIAGNOSIS — M545 Low back pain: Secondary | ICD-10-CM | POA: Diagnosis not present

## 2016-09-11 DIAGNOSIS — M545 Low back pain: Secondary | ICD-10-CM | POA: Diagnosis not present

## 2016-09-11 DIAGNOSIS — S338XXA Sprain of other parts of lumbar spine and pelvis, initial encounter: Secondary | ICD-10-CM | POA: Diagnosis not present

## 2016-09-11 DIAGNOSIS — M47816 Spondylosis without myelopathy or radiculopathy, lumbar region: Secondary | ICD-10-CM | POA: Diagnosis not present

## 2016-09-11 DIAGNOSIS — Z4802 Encounter for removal of sutures: Secondary | ICD-10-CM | POA: Diagnosis not present

## 2016-09-11 DIAGNOSIS — Z23 Encounter for immunization: Secondary | ICD-10-CM | POA: Diagnosis not present

## 2016-09-11 DIAGNOSIS — M9903 Segmental and somatic dysfunction of lumbar region: Secondary | ICD-10-CM | POA: Diagnosis not present

## 2016-09-23 DIAGNOSIS — K045 Chronic apical periodontitis: Secondary | ICD-10-CM | POA: Diagnosis not present

## 2016-10-16 ENCOUNTER — Observation Stay (HOSPITAL_COMMUNITY)
Admission: EM | Admit: 2016-10-16 | Discharge: 2016-10-17 | Disposition: A | Discharge status: Home / Self Care | Payer: Medicare Other | Attending: Cardiovascular Disease | Admitting: Cardiovascular Disease

## 2016-10-16 ENCOUNTER — Encounter (HOSPITAL_COMMUNITY): Payer: Self-pay | Admitting: Emergency Medicine

## 2016-10-16 ENCOUNTER — Emergency Department (HOSPITAL_COMMUNITY): Payer: Medicare Other

## 2016-10-16 ENCOUNTER — Telehealth: Payer: Self-pay | Admitting: Cardiovascular Disease

## 2016-10-16 ENCOUNTER — Encounter (HOSPITAL_COMMUNITY)
Admission: EM | Disposition: A | Discharge status: Home / Self Care | Payer: Self-pay | Source: Home / Self Care | Attending: Emergency Medicine

## 2016-10-16 DIAGNOSIS — Z8249 Family history of ischemic heart disease and other diseases of the circulatory system: Secondary | ICD-10-CM | POA: Insufficient documentation

## 2016-10-16 DIAGNOSIS — I480 Paroxysmal atrial fibrillation: Secondary | ICD-10-CM | POA: Insufficient documentation

## 2016-10-16 DIAGNOSIS — I493 Ventricular premature depolarization: Secondary | ICD-10-CM | POA: Diagnosis not present

## 2016-10-16 DIAGNOSIS — I2582 Chronic total occlusion of coronary artery: Secondary | ICD-10-CM | POA: Diagnosis not present

## 2016-10-16 DIAGNOSIS — R0789 Other chest pain: Secondary | ICD-10-CM | POA: Diagnosis not present

## 2016-10-16 DIAGNOSIS — I252 Old myocardial infarction: Secondary | ICD-10-CM | POA: Insufficient documentation

## 2016-10-16 DIAGNOSIS — Z823 Family history of stroke: Secondary | ICD-10-CM | POA: Diagnosis not present

## 2016-10-16 DIAGNOSIS — E785 Hyperlipidemia, unspecified: Secondary | ICD-10-CM | POA: Insufficient documentation

## 2016-10-16 DIAGNOSIS — K449 Diaphragmatic hernia without obstruction or gangrene: Secondary | ICD-10-CM | POA: Insufficient documentation

## 2016-10-16 DIAGNOSIS — K219 Gastro-esophageal reflux disease without esophagitis: Secondary | ICD-10-CM | POA: Insufficient documentation

## 2016-10-16 DIAGNOSIS — I200 Unstable angina: Secondary | ICD-10-CM | POA: Diagnosis present

## 2016-10-16 DIAGNOSIS — Z951 Presence of aortocoronary bypass graft: Secondary | ICD-10-CM | POA: Diagnosis not present

## 2016-10-16 DIAGNOSIS — I2510 Atherosclerotic heart disease of native coronary artery without angina pectoris: Secondary | ICD-10-CM | POA: Diagnosis not present

## 2016-10-16 DIAGNOSIS — Z87891 Personal history of nicotine dependence: Secondary | ICD-10-CM | POA: Insufficient documentation

## 2016-10-16 DIAGNOSIS — R079 Chest pain, unspecified: Secondary | ICD-10-CM

## 2016-10-16 DIAGNOSIS — I10 Essential (primary) hypertension: Secondary | ICD-10-CM | POA: Insufficient documentation

## 2016-10-16 DIAGNOSIS — R072 Precordial pain: Secondary | ICD-10-CM | POA: Diagnosis present

## 2016-10-16 DIAGNOSIS — Z7901 Long term (current) use of anticoagulants: Secondary | ICD-10-CM | POA: Insufficient documentation

## 2016-10-16 DIAGNOSIS — Z7982 Long term (current) use of aspirin: Secondary | ICD-10-CM | POA: Diagnosis not present

## 2016-10-16 DIAGNOSIS — I25110 Atherosclerotic heart disease of native coronary artery with unstable angina pectoris: Principal | ICD-10-CM | POA: Insufficient documentation

## 2016-10-16 HISTORY — DX: Unspecified osteoarthritis, unspecified site: M19.90

## 2016-10-16 HISTORY — DX: Unstable angina: I20.0

## 2016-10-16 HISTORY — PX: CARDIAC CATHETERIZATION: SHX172

## 2016-10-16 HISTORY — DX: Chest pain, unspecified: R07.9

## 2016-10-16 LAB — COMPREHENSIVE METABOLIC PANEL
ALT: 15 U/L — ABNORMAL LOW (ref 17–63)
ANION GAP: 6 (ref 5–15)
AST: 21 U/L (ref 15–41)
Albumin: 3.6 g/dL (ref 3.5–5.0)
Alkaline Phosphatase: 47 U/L (ref 38–126)
BILIRUBIN TOTAL: 1.4 mg/dL — AB (ref 0.3–1.2)
BUN: 11 mg/dL (ref 6–20)
CO2: 26 mmol/L (ref 22–32)
Calcium: 8.7 mg/dL — ABNORMAL LOW (ref 8.9–10.3)
Chloride: 109 mmol/L (ref 101–111)
Creatinine, Ser: 1.1 mg/dL (ref 0.61–1.24)
GFR calc Af Amer: >60 mL/min (ref >60)
Glucose, Bld: 107 mg/dL — ABNORMAL HIGH (ref 65–99)
POTASSIUM: 4.1 mmol/L (ref 3.5–5.1)
Sodium: 141 mmol/L (ref 135–145)
TOTAL PROTEIN: 5.9 g/dL — AB (ref 6.5–8.1)

## 2016-10-16 LAB — I-STAT TROPONIN, ED
TROPONIN I, POC: 0 ng/mL (ref 0.00–0.08)
Troponin i, poc: 0 ng/mL (ref 0.00–0.08)

## 2016-10-16 LAB — CBC
HCT: 42.4 % (ref 39.0–52.0)
HEMOGLOBIN: 14.8 g/dL (ref 13.0–17.0)
MCH: 32.8 pg (ref 26.0–34.0)
MCHC: 34.9 g/dL (ref 30.0–36.0)
MCV: 94 fL (ref 78.0–100.0)
Platelets: 142 10*3/uL — ABNORMAL LOW (ref 150–400)
RBC: 4.51 MIL/uL (ref 4.22–5.81)
RDW: 11.7 % (ref 11.5–15.5)
WBC: 4.7 10*3/uL (ref 4.0–10.5)

## 2016-10-16 LAB — LIPASE, BLOOD: Lipase: 17 U/L (ref 11–51)

## 2016-10-16 LAB — DIFFERENTIAL
Basophils Absolute: 0 10*3/uL (ref 0.0–0.1)
Basophils Relative: 0 %
EOS ABS: 0.2 10*3/uL (ref 0.0–0.7)
EOS PCT: 5 %
LYMPHS ABS: 1.2 10*3/uL (ref 0.7–4.0)
Lymphocytes Relative: 25 %
MONOS PCT: 8 %
Monocytes Absolute: 0.4 10*3/uL (ref 0.1–1.0)
NEUTROS PCT: 62 %
Neutro Abs: 3 10*3/uL (ref 1.7–7.7)

## 2016-10-16 LAB — PROTIME-INR
INR: 1.13
PROTHROMBIN TIME: 14.5 s (ref 11.4–15.2)

## 2016-10-16 SURGERY — LEFT HEART CATH AND CORS/GRAFTS ANGIOGRAPHY
Anesthesia: LOCAL

## 2016-10-16 MED ORDER — IOPAMIDOL (ISOVUE-370) INJECTION 76%
INTRAVENOUS | Status: DC | PRN
  Administered 2016-10-16: 110 mL via INTRA_ARTERIAL

## 2016-10-16 MED ORDER — ONDANSETRON HCL 4 MG/2ML IJ SOLN: 4.0000 mg | Freq: Four times a day (QID) | INTRAMUSCULAR | Status: DC | PRN

## 2016-10-16 MED ORDER — IOPAMIDOL (ISOVUE-370) INJECTION 76%
INTRAVENOUS | Status: AC
  Filled 2016-10-16: qty 100

## 2016-10-16 MED ORDER — MIDAZOLAM HCL 2 MG/2ML IJ SOLN
INTRAMUSCULAR | Status: DC | PRN
  Administered 2016-10-16: 1 mg via INTRAVENOUS

## 2016-10-16 MED ORDER — FENTANYL CITRATE (PF) 100 MCG/2ML IJ SOLN
INTRAMUSCULAR | Status: DC | PRN
  Administered 2016-10-16: 25 ug via INTRAVENOUS

## 2016-10-16 MED ORDER — SODIUM CHLORIDE 0.9 % IV BOLUS (SEPSIS)
500.0000 mL | Freq: Once | INTRAVENOUS | Status: AC
  Administered 2016-10-16: 500 mL via INTRAVENOUS

## 2016-10-16 MED ORDER — PANTOPRAZOLE SODIUM 40 MG PO TBEC
40.0000 mg | DELAYED_RELEASE_TABLET | Freq: Every day | ORAL | Status: DC
  Administered 2016-10-17: 40 mg via ORAL
  Filled 2016-10-16: qty 1

## 2016-10-16 MED ORDER — SODIUM CHLORIDE 0.9% FLUSH: 3.0000 mL | INTRAVENOUS | Status: DC | PRN

## 2016-10-16 MED ORDER — NITROGLYCERIN 0.4 MG SL SUBL: 0.4000 mg | SUBLINGUAL_TABLET | SUBLINGUAL | Status: DC | PRN

## 2016-10-16 MED ORDER — CALCIUM CARBONATE 600 MG PO TABS: 600.0000 mg | ORAL_TABLET | Freq: Every day | ORAL | Status: DC

## 2016-10-16 MED ORDER — MIDAZOLAM HCL 2 MG/2ML IJ SOLN
INTRAMUSCULAR | Status: AC
  Filled 2016-10-16: qty 4

## 2016-10-16 MED ORDER — ROSUVASTATIN CALCIUM 20 MG PO TABS
20.0000 mg | ORAL_TABLET | Freq: Every day | ORAL | Status: DC
  Administered 2016-10-16: 20 mg via ORAL
  Filled 2016-10-16: qty 1

## 2016-10-16 MED ORDER — VERAPAMIL HCL 2.5 MG/ML IV SOLN
INTRAVENOUS | Status: DC | PRN
  Administered 2016-10-16: 10 mL via INTRA_ARTERIAL

## 2016-10-16 MED ORDER — SODIUM CHLORIDE 0.9 % IV SOLN: 10.0000 mL/h | INTRAVENOUS | Status: DC

## 2016-10-16 MED ORDER — HEPARIN (PORCINE) IN NACL 2-0.9 UNIT/ML-% IJ SOLN
INTRAMUSCULAR | Status: AC
  Filled 2016-10-16: qty 1000

## 2016-10-16 MED ORDER — ASPIRIN EC 81 MG PO TBEC
81.0000 mg | DELAYED_RELEASE_TABLET | Freq: Every day | ORAL | Status: DC
  Administered 2016-10-16: 81 mg via ORAL
  Filled 2016-10-16: qty 1

## 2016-10-16 MED ORDER — NITROGLYCERIN 0.4 MG SL SUBL
0.4000 mg | SUBLINGUAL_TABLET | SUBLINGUAL | Status: DC | PRN
  Administered 2016-10-16: 0.4 mg via SUBLINGUAL
  Filled 2016-10-16: qty 1

## 2016-10-16 MED ORDER — HEPARIN SODIUM (PORCINE) 1000 UNIT/ML IJ SOLN
INTRAMUSCULAR | Status: DC | PRN
  Administered 2016-10-16: 4000 [IU] via INTRAVENOUS

## 2016-10-16 MED ORDER — SODIUM CHLORIDE 0.9 % IV SOLN: INTRAVENOUS | Status: AC

## 2016-10-16 MED ORDER — LIDOCAINE HCL (PF) 1 % IJ SOLN
INTRAMUSCULAR | Status: AC
  Filled 2016-10-16: qty 30

## 2016-10-16 MED ORDER — CALCIUM CARBONATE 1250 (500 CA) MG PO TABS
1.0000 | ORAL_TABLET | Freq: Every day | ORAL | Status: DC
  Administered 2016-10-17: 08:00:00 500 mg via ORAL
  Filled 2016-10-16: qty 1

## 2016-10-16 MED ORDER — FENTANYL CITRATE (PF) 100 MCG/2ML IJ SOLN
INTRAMUSCULAR | Status: AC
  Filled 2016-10-16: qty 2

## 2016-10-16 MED ORDER — ACETAMINOPHEN 325 MG PO TABS: 650.0000 mg | ORAL_TABLET | ORAL | Status: DC | PRN

## 2016-10-16 MED ORDER — METOPROLOL TARTRATE 25 MG PO TABS
25.0000 mg | ORAL_TABLET | Freq: Two times a day (BID) | ORAL | Status: DC
  Administered 2016-10-16 – 2016-10-17 (×2): 25 mg via ORAL
  Filled 2016-10-16 (×2): qty 1

## 2016-10-16 MED ORDER — SODIUM CHLORIDE 0.9 % IV SOLN: 250.0000 mL | INTRAVENOUS | Status: DC | PRN

## 2016-10-16 MED ORDER — HEPARIN SODIUM (PORCINE) 1000 UNIT/ML IJ SOLN
INTRAMUSCULAR | Status: AC
  Filled 2016-10-16: qty 1

## 2016-10-16 MED ORDER — ONDANSETRON HCL 4 MG/2ML IJ SOLN
4.0000 mg | Freq: Once | INTRAMUSCULAR | Status: AC
Start: 2016-10-16 — End: 2016-10-16
  Administered 2016-10-16: 4 mg via INTRAVENOUS
  Filled 2016-10-16: qty 2

## 2016-10-16 MED ORDER — MORPHINE SULFATE (PF) 4 MG/ML IV SOLN
2.0000 mg | Freq: Once | INTRAVENOUS | Status: AC
  Administered 2016-10-16: 2 mg via INTRAVENOUS
  Filled 2016-10-16: qty 1

## 2016-10-16 MED ORDER — SODIUM CHLORIDE 0.9% FLUSH: 3.0000 mL | Freq: Two times a day (BID) | INTRAVENOUS | Status: DC

## 2016-10-16 MED ORDER — VERAPAMIL HCL 2.5 MG/ML IV SOLN
INTRAVENOUS | Status: AC
  Filled 2016-10-16: qty 2

## 2016-10-16 MED ORDER — HEPARIN (PORCINE) IN NACL 2-0.9 UNIT/ML-% IJ SOLN
INTRAMUSCULAR | Status: DC | PRN
  Administered 2016-10-16: 1000 mL

## 2016-10-16 MED ORDER — TAMSULOSIN HCL 0.4 MG PO CAPS
0.4000 mg | ORAL_CAPSULE | Freq: Every day | ORAL | Status: DC
  Administered 2016-10-16: 20:00:00 0.4 mg via ORAL
  Filled 2016-10-16: qty 1

## 2016-10-16 SURGICAL SUPPLY — 12 items
CATH EXPO 5F IM (CATHETERS) ×2 IMPLANT
CATH INFINITI 5 FR LCB (CATHETERS) ×2 IMPLANT
CATH INFINITI 5FR MULTPACK ANG (CATHETERS) ×2 IMPLANT
DEVICE RAD COMP TR BAND LRG (VASCULAR PRODUCTS) ×2 IMPLANT
GLIDESHEATH SLEND SS 6F .021 (SHEATH) ×2 IMPLANT
GUIDEWIRE INQWIRE 1.5J.035X260 (WIRE) ×1 IMPLANT
INQWIRE 1.5J .035X260CM (WIRE) ×2
KIT HEART LEFT (KITS) ×2 IMPLANT
PACK CARDIAC CATHETERIZATION (CUSTOM PROCEDURE TRAY) ×2 IMPLANT
SYR MEDRAD MARK V 150ML (SYRINGE) ×2 IMPLANT
TRANSDUCER W/STOPCOCK (MISCELLANEOUS) ×2 IMPLANT
TUBING CIL FLEX 10 FLL-RA (TUBING) ×2 IMPLANT

## 2016-10-16 NOTE — ED Triage Notes (Signed)
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

## 2016-10-16 NOTE — ED Notes (Signed)
Patient taken to xray.

## 2016-10-16 NOTE — ED Notes (Signed)
BP dropped post SL nitro. PA aware and ordered 500 ml bolus.

## 2016-10-16 NOTE — Interval H&P Note (Signed)
History and Physical Interval Note:  10/16/2016 4:37 PM  Ruben Burton  has presented today for cardiac cath with the diagnosis of unstable angina  The various methods of treatment have been discussed with the patient and family. After consideration of risks, benefits and other options for treatment, the patient has consented to  Procedure(s): Left Heart Cath and Cors/Grafts Angiography (N/A) as a surgical intervention .  The patient's history has been reviewed, patient examined, no change in status, stable for surgery.  I have reviewed the patient's chart and labs.  Questions were answered to the patient's satisfaction.    Cath Lab Visit (complete for each Cath Lab visit)  Clinical Evaluation Leading to the Procedure:   ACS: No.  Non-ACS:    Anginal Classification: CCS III  Anti-ischemic medical therapy: Minimal Therapy (1 class of medications)  Non-Invasive Test Results: No non-invasive testing performed  Prior CABG: Previous CABG         Ruben Burton

## 2016-10-16 NOTE — Telephone Encounter (Signed)
Pt c/o of Chest Pain: STAT if CP now or developed within 24 hours  1. Are you having CP right now? no  2. Are you experiencing any other symptoms (ex. SOB, nausea, vomiting, sweating)? sweating  3. How long have you been experiencing CP? yesterday  4. Is your CP continuous or coming and going? Off and on  5. Have you taken Nitroglycerin? Yes-just now ?

## 2016-10-16 NOTE — Telephone Encounter (Signed)
Patient's wife called and said that her husband is currently experiencing chest pain. She states that his pain is sharp in the middle of his chest and that he is sweating. She said that he does not have and nausea, vomiting, jaw pain or arm pain. She states that he has taken 1 NTG and that he is still having chest pain. I advised her to have him take another NTG and see if his chest pain gets better. She states that he does not want to take another NTG that he feels weak. I explained to her that if he is still having chest pain he needs to take another NTG, and that if he is still experiencing chest pain after that that he needs to take a 3rd NTG and call 911. I explained to the wife that if he is experiencing chest pain after 3 NTG that he needs to go to the ED. She verbalized understanding and was in agreement.

## 2016-10-16 NOTE — ED Provider Notes (Signed)
[facility] DEPT Provider Note   CSN: [id number] Arrival date & time: 10/16/16  [id number]     History   Chief Complaint Chief Complaint  Patient presents with  . Chest Pain    HPI  Blood pressure 122/75, pulse (!) 56, temperature 97.8 F (36.6 C), temperature source Oral, resp. rate 16, SpO2 100 %.  OSRIC COUPLAND is a 75 y.o. male CAD, HTN, HLD, NSTEMI s/p CABG proximal A. fib stress Myoview in February 2016 with no ischemic changes, complaining of retrosternal, nonradiating chest pain onset this morning when he was walking from his house to his workshop at [employer]. Pain was initially severe, 8 out of 10 and described as sharp, he went back into the house he felt lightheaded and diaphoretic. There was no associated shortness of breath. He took a nitroglycerin in this alleviated the pain to 5 out of 10 but he became flushed and felt terrible after the nitroglycerin. He states that this does feel dissimilar to his prior heart attack. He takes a daily low-dose aspirin which she had last night. He is not anticoagulated. He doesn't have any history of DVT/PE. He has a mild cough without mucus production or hemoptysis. He denies increasing peripheral edema, orthopnea, PND.  Cardiology: Angelena Form  Past Medical History:  Diagnosis Date  . Coronary artery disease   . GERD (gastroesophageal reflux disease)   . Hx of cardiovascular stress test    a. ETT-MV 1/14: no ischemia; not gated  . Hyperlipidemia   . Hypertension   . Paroxysmal atrial fibrillation (HCC)   . Pleural effusion     Patient Active Problem List   Diagnosis Date Noted  . HYPERLIPIDEMIA 03/07/2010  . HYPERTENSION 03/07/2010  . CAD, ARTERY BYPASS GRAFT 03/07/2010  . PAROXYSMAL ATRIAL FIBRILLATION 03/07/2010  . PLEURAL EFFUSION, LEFT 03/07/2010  . GERD 03/07/2010    Past Surgical History:  Procedure Laterality Date  . CHOLECYSTECTOMY    . COLONOSCOPY N/A 12/01/2013   Procedure: COLONOSCOPY;  Surgeon: Rogene Houston, MD;   Location: [facility];  Service: Endoscopy;  Laterality: N/A;  830  . CORONARY ARTERY BYPASS GRAFT  02-13-10   2V (LIMA to LAD, SVG to diagonal)  . EXPLORATORY LAPAROTOMY     for perforated duodenal ulcer  . hydrocelectomy         Home Medications    Prior to Admission medications   Medication Sig Start Date End Date Taking? Authorizing Provider  aspirin EC 81 MG tablet Take 81 mg by mouth daily.   Yes Historical Provider, MD  calcium carbonate (OS-CAL) 600 MG TABS Take 600 mg by mouth daily.    Yes Historical Provider, MD  metoprolol (LOPRESSOR) 25 MG tablet Take one tablet by mouth twice daily. 05/09/11  Yes Burnell Blanks, MD  NEXIUM 40 MG capsule Take 1 tablet by mouth daily. 08/18/16  Yes Historical Provider, MD  nitroGLYCERIN (NITROSTAT) 0.4 MG SL tablet Place 1 tablet (0.4 mg total) under the tongue every 5 (five) minutes as needed for chest pain. 01/10/14 10/16/16 Yes Burnell Blanks, MD  rosuvastatin (CRESTOR) 20 MG tablet Take 1 tablet (20 mg total) by mouth daily. 01/22/16  Yes Burnell Blanks, MD  tamsulosin (FLOMAX) 0.4 MG CAPS capsule Take 1 capsule by mouth daily. 07/07/16  Yes Historical Provider, MD    Family History Family History  Problem Relation Age of Onset  . Other Mother     alive- healthy  . Hypertension Mother   . Stroke Father  deceased  . Hypertension Father   . Other Brother     CABG  . Heart attack Brother   . Hypertension Brother   . Colon cancer Neg Hx     Social History Social History  Substance Use Topics  . Smoking status: Former Smoker    Packs/day: 0.25    Years: 1.00    Types: Cigarettes  . Smokeless tobacco: Never Used  . Alcohol use No     Allergies   Oxytetracycline   Review of Systems Review of Systems  10 systems reviewed and found to be negative, except as noted in the HPI.   Physical Exam Updated Vital Signs BP 140/81   Pulse (!) 58   Temp 97.9 F (36.6 C) (Oral)   Resp 12   Ht 6\' 1"   (1.854 m)   Wt 83.9 kg   SpO2 100%   BMI 24.41 kg/m   Physical Exam  Constitutional: He is oriented to person, place, and time. He appears well-developed and well-nourished. No distress.  HENT:  Head: Normocephalic.  Mouth/Throat: Oropharynx is clear and moist.  Eyes: Conjunctivae are normal.  Neck: Normal range of motion. No JVD present. No tracheal deviation present.  Cardiovascular: Normal rate, regular rhythm and intact distal pulses.   Radial pulse equal bilaterally  Pulmonary/Chest: Effort normal and breath sounds normal. No stridor. No respiratory distress. He has no wheezes. He has no rales. He exhibits no tenderness.  Abdominal: Soft. He exhibits no distension and no mass. There is no tenderness. There is no rebound and no guarding.  Musculoskeletal: Normal range of motion. He exhibits no edema or tenderness.  No calf asymmetry, superficial collaterals, palpable cords, edema, Homans sign negative bilaterally.    Neurological: He is alert and oriented to person, place, and time.  Skin: Skin is warm. He is not diaphoretic.  Psychiatric: He has a normal mood and affect.  Nursing note and vitals reviewed.    ED Treatments / Results  Labs (all labs ordered are listed, but only abnormal results are displayed) Labs Reviewed  CBC - Abnormal; Notable for the following:       Result Value   Platelets 142 (*)    All other components within normal limits  COMPREHENSIVE METABOLIC PANEL - Abnormal; Notable for the following:    Glucose, Bld 107 (*)    Calcium 8.7 (*)    Total Protein 5.9 (*)    ALT 15 (*)    Total Bilirubin 1.4 (*)    All other components within normal limits  DIFFERENTIAL  PROTIME-INR  LIPASE, BLOOD  I-STAT TROPOININ, ED  I-STAT TROPOININ, ED    EKG  EKG Interpretation  Date/Time:  Thursday October 16 2016 09:59:56 EST Ventricular Rate:  61 PR Interval:  196 QRS Duration: 98 QT Interval:  408 QTC Calculation: 410 R Axis:   -14 Text  Interpretation:  Normal sinus rhythm Normal ECG Confirmed by KNOTT MD, DANIEL [phone]) on 10/16/2016 10:43:32 AM       Radiology Dg Chest 2 View  Result Date: 10/16/2016 CLINICAL DATA:  Central chest pain for several hours, initial encounter EXAM: CHEST  2 VIEW COMPARISON:  07/14/2016 FINDINGS: Cardiac shadow is within normal limits. Postsurgical changes are noted. Scarring is noted in the left lung base. No focal infiltrate or sizable effusion is seen. No bony abnormality is noted IMPRESSION: No active cardiopulmonary disease. Electronically Signed   By: Inez Catalina M.D.   On: 10/16/2016 11:33    Procedures Procedures (including critical  care time)  Medications Ordered in ED Medications  0.9 %  sodium chloride infusion (not administered)  nitroGLYCERIN (NITROSTAT) SL tablet 0.4 mg (0.4 mg Sublingual Given 10/16/16 1033)  sodium chloride 0.9 % bolus 500 mL (0 mLs Intravenous Stopped 10/16/16 1217)  morphine 4 MG/ML injection 2 mg (2 mg Intravenous Given 10/16/16 1213)  ondansetron (ZOFRAN) injection 4 mg (4 mg Intravenous Given 10/16/16 1213)     Initial Impression / Assessment and Plan / ED Course  I have reviewed the triage vital signs and the nursing notes.  Pertinent labs & imaging results that were available during my care of the patient were reviewed by me and considered in my medical decision making (see chart for details).  Clinical Course     Vitals:   10/16/16 1147 10/16/16 1330 10/16/16 1430 10/16/16 1500  BP: 109/69 118/79 121/80 140/81  Pulse: (!) 58 (!) 55 (!) 52 (!) 58  Resp: 26 13 13 12   Temp: 97.9 F (36.6 C)     TempSrc: Oral     SpO2: 99% 98% 99% 100%  Weight:      Height:        Medications  0.9 %  sodium chloride infusion (not administered)  nitroGLYCERIN (NITROSTAT) SL tablet 0.4 mg (0.4 mg Sublingual Given 10/16/16 1033)  sodium chloride 0.9 % bolus 500 mL (0 mLs Intravenous Stopped 10/16/16 1217)  morphine 4 MG/ML injection 2 mg (2 mg  Intravenous Given 10/16/16 1213)  ondansetron (ZOFRAN) injection 4 mg (4 mg Intravenous Given 10/16/16 1213)    LEMONT DUEL is 75 y.o. male presenting with Retrosternal nonradiating chest pain described as sharp onset this morning at about 9 AM when he was walking out to his workshop from the house. Lightheaded and diaphoretic with that he was not short of breath, this is not similar to his prior heart attacks. EKG unchanged, chest pain alleviated with nitroglycerin however he felt very warm afterward he became hypotensive in the ED after given a sublingual nitroglycerin, this responds appropriately to small bolus of fluids. Chest pain approximately 4 out of 10 after second nitroglycerin agent given full dose aspirin, will give morphine for chest pain relief, cardiology consulted  Chest pain free after morphine.  Delta troponin negative, will repeat cardiology to evaluate this patient.  3:37 PM Cardiology is in the room evaluating the patient.  Case signed out PA Harris at shift change: plan is to cardiology recommendations for disposition.  Final Clinical Impressions(s) / ED Diagnoses   Final diagnoses:  None    New Prescriptions New Prescriptions   No medications on file     Monico Blitz, PA-C 10/16/16 1549    Leo Grosser, MD 10/17/16 [phone]

## 2016-10-16 NOTE — H&P (Signed)
Patient ID: DEEJAY GOODRIDGE MRN: [id number], DOB/AGE: [id number]   Admit date: 10/16/2016   Primary Physician: Thressa Sheller, MD Primary Cardiologist: Dr. Angelena Form  Pt. Profile:  Ruben Burton is a 75 y.o. male with a history of CAD s/p CABG, PAF, HTN, HLD who presented to [facility] ED for evaluation of Chest pain.  HPI: As above. Patient was admitted 4/11 with a non-STEMI. He subsequently underwent CABG. Postop course was complicated by atrial fibrillation. Echo 4/11: EF 55-60%, mildly dilated RV. Followup with monitor was negative for recurrent atrial fibrillation.  Coumadin was discontinued. Stress myoview February 2016 with no ischemia. PVCs noted on EKG during stress.  He was seen by Dr. Irish Lack in clinic 01/2016 with complain of dizziness, palpitation and chest pain. His pain felt atypical consistent with musculoskeletal. Not like prior MI. and no intervention done.  He was doing well on cardiac stand point when last seen by Dr. Angelena Form 08/25/16.  He was in usual state of health up until about 2 weeks ago he has noted exertional "chest tightness" that relieved with rest. Also noted some fatigue and tiredness recently. Today he had substernal chest pain while walking from house to his workshop at house. He does wood work. His pain exacerbated by lifting some wood, "not heavy". He went inside home and took sublingual nitroglycerin x 1 with improvement of chest pressure from 8 out of 10 to 5 out of 10. He became flush after words and hagitated to took another nitroglycerin. Denies associated nausea, vomiting or radiation of pain. He call office and advised to come to the ER. Here he was given another nitroglycerin x 1 with minimal relief. His chest pain resolved after giving IV morphine. He states that this episode is similar to when he had a CABG, however, less severe. This is the first time he required sublingual nitroglycerin after CABG.  EKG shows normal sinus rhythm at a rate of 61  bpm. Point-of-care troponin x 2 negative. Lytes and serum creatinine normal. Chest x-ray clear.  Denies use of tobacco abuse, alcohol abuse or illicit drug.   Problem List  Past Medical History:  Diagnosis Date  . Coronary artery disease   . GERD (gastroesophageal reflux disease)   . Hx of cardiovascular stress test    a. ETT-MV 1/14: no ischemia; not gated  . Hyperlipidemia   . Hypertension   . Paroxysmal atrial fibrillation (HCC)   . Pleural effusion     Past Surgical History:  Procedure Laterality Date  . CHOLECYSTECTOMY    . COLONOSCOPY N/A 12/01/2013   Procedure: COLONOSCOPY;  Surgeon: Rogene Houston, MD;  Location: [facility];  Service: Endoscopy;  Laterality: N/A;  830  . CORONARY ARTERY BYPASS GRAFT  02-13-10   2V (LIMA to LAD, SVG to diagonal)  . EXPLORATORY LAPAROTOMY     for perforated duodenal ulcer  . hydrocelectomy       Allergies  Allergies  Allergen Reactions  . Oxytetracycline Other (See Comments)    Passes out.     Home Medications  Prior to Admission medications   Medication Sig Start Date End Date Taking? Authorizing Provider  aspirin EC 81 MG tablet Take 81 mg by mouth daily.   Yes Historical Provider, MD  calcium carbonate (OS-CAL) 600 MG TABS Take 600 mg by mouth daily.    Yes Historical Provider, MD  metoprolol (LOPRESSOR) 25 MG tablet Take one tablet by mouth twice daily. 05/09/11  Yes Burnell Blanks, MD  Jonna Munro  40 MG capsule Take 1 tablet by mouth daily. 08/18/16  Yes Historical Provider, MD  nitroGLYCERIN (NITROSTAT) 0.4 MG SL tablet Place 1 tablet (0.4 mg total) under the tongue every 5 (five) minutes as needed for chest pain. 01/10/14 10/16/16 Yes Burnell Blanks, MD  rosuvastatin (CRESTOR) 20 MG tablet Take 1 tablet (20 mg total) by mouth daily. 01/22/16  Yes Burnell Blanks, MD  tamsulosin (FLOMAX) 0.4 MG CAPS capsule Take 1 capsule by mouth daily. 07/07/16  Yes Historical Provider, MD    Family History  Family  History  Problem Relation Age of Onset  . Other Mother     alive- healthy  . Hypertension Mother   . Stroke Father     deceased  . Hypertension Father   . Other Brother     CABG  . Heart attack Brother   . Hypertension Brother   . Colon cancer Neg Hx    Family Status  Relation Status  . Mother Deceased  . Father Deceased  . Brother   . Brother   . Brother   . Neg Hx      Social History  Social History   Social History  . Marital status: Married    Spouse name: N/A  . Number of children: 2  . Years of education: N/A   Occupational History  . retirede Retired   Social History Main Topics  . Smoking status: Former Smoker    Packs/day: 0.25    Years: 1.00    Types: Cigarettes  . Smokeless tobacco: Never Used  . Alcohol use No  . Drug use: No  . Sexual activity: Not on file   Other Topics Concern  . Not on file   Social History Narrative  . No narrative on file      All other systems reviewed and are otherwise negative except as noted above.  Physical Exam  Blood pressure 109/69, pulse (!) 58, temperature 97.9 F (36.6 C), temperature source Oral, resp. rate 26, height 6\' 1"  (1.854 m), weight 185 lb (83.9 kg), SpO2 99 %.  General: Pleasant, NAD Psych: Normal affect. Neuro: Alert and oriented X 3. Moves all extremities spontaneously. HEENT: Normal  Neck: Supple without bruits or JVD. Lungs:  Resp regular and unlabored, CTA. Heart: RRR no s3, s4, or murmurs. Abdomen: Soft, non-tender, non-distended, BS + x 4.  Extremities: No clubbing, cyanosis or edema. DP/PT/Radials 2+ and equal bilaterally.  Labs  No results for input(s): CKTOTAL, CKMB, TROPONINI in the last 72 hours. Lab Results  Component Value Date   WBC 4.7 10/16/2016   HGB 14.8 10/16/2016   HCT 42.4 10/16/2016   MCV 94.0 10/16/2016   PLT 142 (L) 10/16/2016    Recent Labs Lab 10/16/16 1038  NA 141  K 4.1  CL 109  CO2 26  BUN 11  CREATININE 1.10  CALCIUM 8.7*  PROT 5.9*    BILITOT 1.4*  ALKPHOS 47  ALT 15*  AST 21  GLUCOSE 107*   Lab Results  Component Value Date   CHOL 116 05/14/2010   HDL 32.80 (L) 05/14/2010   LDLCALC 55 05/14/2010   TRIG 141.0 05/14/2010   No results found for: DDIMER   Radiology/Studies  Dg Chest 2 View  Result Date: 10/16/2016 CLINICAL DATA:  Central chest pain for several hours, initial encounter EXAM: CHEST  2 VIEW COMPARISON:  07/14/2016 FINDINGS: Cardiac shadow is within normal limits. Postsurgical changes are noted. Scarring is noted in the left lung base. No  focal infiltrate or sizable effusion is seen. No bony abnormality is noted IMPRESSION: No active cardiopulmonary disease. Electronically Signed   By: Inez Catalina M.D.   On: 10/16/2016 11:33   CT CHEST WITHOUT CONTRAST 07/14/16 IMPRESSION: 1. RIGHT middle lobe nodule stable over 3 month interval. Recommend single follow-up CT in 12 months from current month per Fleischner criteria for solid nodule. 2. Large hiatal hernia. 3. Coronary artery calcification and aortic atherosclerotic calcification.  ASSESSMENT AND PLAN  1. Chest pain likely anginal equivalent s/p CABG 02/2010 (LIMA to LAD, SVG to diagonal) - Today he required sublingual nitroglycerin first time since his CABG in 2011. This episode is similar to when he had a CABG however less severe. - Point-of-care troponin negative x 2. EKG without acute changes. His chest pain minimally improvement after sublingual nitroglycerin. - Will proceed with cardiac cath with definite evaluation of coronary anatomy.   2. Hypertension - Stable and well controlled on current regimen.  3.  HLD - Followed by PCP  4. Paroxysmal atrial fibrillation - No reoccurrence of atrial fibrillation since his postop CABG  in 2011. Sinus rhythm today.   SignedLeanor Kail, PA-C 10/16/2016, 2:46 PM Pager [phone]  Patient seen, examined. Available data reviewed. Agree with findings, assessment, and plan as outlined by  Robbie Lis, PA-C. Exam reveals an alert, oriented male in no distress. JVP is normal, no carotid bruits, heart is regular rate and rhythm without murmur or gallop, lungs are clear, abdomen is soft and nontender, there is no peripheral edema. EKG shows normal sinus rhythm without significant ST change. Troponins are negative.  The patient has known CAD now 6 years post CABG. He has a history of normal perfusion scan in 2016. His symptoms are now more convincing for angina. He developed substernal chest discomfort with physical exertion today while he was carrying some walks. Symptoms resolve with rest and nitroglycerin. Fortunately he has no objective evidence of ischemia/infarction with normal troponins and a normal EKG. We discussed diagnostic options including repeat nuclear stress testing versus definitive evaluation with cardiac catheterization. Considering his typical history and known CAD with previous CABG, favor invasive evaluation with cath plus or minus PCI for evaluation and treatment of unstable angina. I have reviewed the risks, indications, and alternatives to cardiac catheterization, possible angioplasty, and stenting with the patient. Risks include but are not limited to bleeding, infection, vascular injury, stroke, myocardial infection, arrhythmia, kidney injury, radiation-related injury in the case of prolonged fluoroscopy use, emergency cardiac surgery, and death. The patient understands the risks of serious complication is 1-2 in [id number] with diagnostic cardiac cath and 1-2% or less with angioplasty/stenting.   Sherren Mocha, M.D. 10/16/2016 4:30 PM

## 2016-10-17 ENCOUNTER — Encounter (HOSPITAL_COMMUNITY): Payer: Self-pay | Admitting: Cardiovascular Disease

## 2016-10-17 DIAGNOSIS — I25110 Atherosclerotic heart disease of native coronary artery with unstable angina pectoris: Secondary | ICD-10-CM | POA: Diagnosis not present

## 2016-10-17 DIAGNOSIS — I200 Unstable angina: Secondary | ICD-10-CM | POA: Diagnosis not present

## 2016-10-17 LAB — CBC
HEMATOCRIT: 39.9 % (ref 39.0–52.0)
Hemoglobin: 13.8 g/dL (ref 13.0–17.0)
MCH: 32.6 pg (ref 26.0–34.0)
MCHC: 34.6 g/dL (ref 30.0–36.0)
MCV: 94.3 fL (ref 78.0–100.0)
Platelets: 131 10*3/uL — ABNORMAL LOW (ref 150–400)
RBC: 4.23 MIL/uL (ref 4.22–5.81)
RDW: 11.8 % (ref 11.5–15.5)
WBC: 5.9 10*3/uL (ref 4.0–10.5)

## 2016-10-17 LAB — BASIC METABOLIC PANEL
Anion gap: 5 (ref 5–15)
BUN: 7 mg/dL (ref 6–20)
CHLORIDE: 109 mmol/L (ref 101–111)
CO2: 26 mmol/L (ref 22–32)
Calcium: 8.3 mg/dL — ABNORMAL LOW (ref 8.9–10.3)
Creatinine, Ser: 0.88 mg/dL (ref 0.61–1.24)
GFR calc Af Amer: >60 mL/min (ref >60)
GFR calc non Af Amer: >60 mL/min (ref >60)
GLUCOSE: 111 mg/dL — AB (ref 65–99)
POTASSIUM: 3.6 mmol/L (ref 3.5–5.1)
Sodium: 140 mmol/L (ref 135–145)

## 2016-10-17 MED ORDER — PANTOPRAZOLE SODIUM 40 MG PO TBEC: 40.0000 mg | DELAYED_RELEASE_TABLET | Freq: Two times a day (BID) | ORAL | 1 refills | Status: DC

## 2016-10-17 NOTE — Progress Notes (Signed)
Patient Name: Ruben Burton Date of Encounter: 10/17/2016  Primary Cardiologist: Dr. Shon Millet Problem List     Active Problems:   Unstable angina pectoris [facility])   Precordial pain   Unstable angina (HCC)   Chest pain   Sinus Bradycardiac with PVCs and ventricular bigemny  Subjective   Feeling well. No chest pain, sob or palpitations.   Inpatient Medications    Scheduled Meds: . aspirin EC  81 mg Oral Daily  . calcium carbonate  1 tablet Oral Q breakfast  . metoprolol tartrate  25 mg Oral BID  . pantoprazole  40 mg Oral Daily  . rosuvastatin  20 mg Oral Q2000  . sodium chloride flush  3 mL Intravenous Q12H  . tamsulosin  0.4 mg Oral Q2000   Continuous Infusions: . sodium chloride     PRN Meds: sodium chloride, acetaminophen, nitroGLYCERIN, ondansetron (ZOFRAN) IV, sodium chloride flush   Vital Signs    Vitals:   10/16/16 2000 10/16/16 2007 10/17/16 0127 10/17/16 0825  BP: (!) 143/79  121/66 121/62  Pulse: 64 75 61 64  Resp: 14  12 16   Temp:   (!) 96.9 F (36.1 C) 97.2 F (36.2 C)  TempSrc:   Axillary Oral  SpO2: 96%  98% 98%  Weight:   191 lb 5.8 oz (86.8 kg)   Height:        Intake/Output Summary (Last 24 hours) at 10/17/16 0954 Last data filed at 10/17/16 0653  Gross per 24 hour  Intake              970 ml  Output              900 ml  Net               70 ml   Filed Weights   10/16/16 1025 10/17/16 0127  Weight: 185 lb (83.9 kg) 191 lb 5.8 oz (86.8 kg)    Physical Exam   GEN: Well nourished, well developed, in no acute distress.  HEENT: Grossly normal.  Neck: Supple, no JVD, carotid bruits, or masses. Cardiac: RRR, no murmurs, rubs, or gallops. No clubbing, cyanosis, edema.  Radials/DP/PT 2+ and equal bilaterally. Left radial cath site without hematoma.   Respiratory:  Respirations regular and unlabored, clear to auscultation bilaterally. GI: Soft, nontender, nondistended, BS + x 4. MS: no deformity or atrophy. Skin: warm and dry,  no rash. Neuro:  Strength and sensation are intact. Psych: AAOx3.  Normal affect.  Labs    CBC  Recent Labs  10/16/16 1038 10/17/16 0257  WBC 4.7 5.9  NEUTROABS 3.0  --   HGB 14.8 13.8  HCT 42.4 39.9  MCV 94.0 94.3  PLT 142* [id number]*   Basic Metabolic Panel  Recent Labs  10/16/16 1038 10/17/16 0257  NA 141 140  K 4.1 3.6  CL 109 109  CO2 26 26  GLUCOSE 107* 111*  BUN 11 7  CREATININE 1.10 0.88  CALCIUM 8.7* 8.3*   Liver Function Tests  Recent Labs  10/16/16 1038  AST 21  ALT 15*  ALKPHOS 47  BILITOT 1.4*  PROT 5.9*  ALBUMIN 3.6    Recent Labs  10/16/16 1038  LIPASE 17   Cardiac Enzymes No results for input(s): CKTOTAL, CKMB, CKMBINDEX, TROPONINI in the last 72 hours. BNP Invalid input(s): POCBNP D-Dimer No results for input(s): DDIMER in the last 72 hours. Hemoglobin A1C No results for input(s): HGBA1C in the last 72 hours. Fasting Lipid  Panel No results for input(s): CHOL, HDL, LDLCALC, TRIG, CHOLHDL, LDLDIRECT in the last 72 hours. Thyroid Function Tests No results for input(s): TSH, T4TOTAL, T3FREE, THYROIDAB in the last 72 hours.  Invalid input(s): FREET3  Telemetry    Sinus rhythm with bradycardia at rate of 50, PVCs and ventricular bigemny Personally Reviewed  ECG    N/A  Radiology    Dg Chest 2 View  Result Date: 10/16/2016 CLINICAL DATA:  Central chest pain for several hours, initial encounter EXAM: CHEST  2 VIEW COMPARISON:  07/14/2016 FINDINGS: Cardiac shadow is within normal limits. Postsurgical changes are noted. Scarring is noted in the left lung base. No focal infiltrate or sizable effusion is seen. No bony abnormality is noted IMPRESSION: No active cardiopulmonary disease. Electronically Signed   By: Inez Catalina M.D.   On: 10/16/2016 11:33    Cardiac Studies   Cath 10/16/16 Left Heart Cath and Cors/Grafts Angiography  Conclusion     The left ventricular ejection fraction is 50-55% by visual estimate.  The left  ventricular systolic function is normal.  LV end diastolic pressure is normal.  There is no mitral valve regurgitation.  Ost 1st Diag lesion, 99 %stenosed.  SVG graft was visualized by angiography.  LIMA graft was visualized by angiography.  Mid LAD lesion, 100 %stenosed.  Ost RCA to Prox RCA lesion, 40 %stenosed.  Prox RCA to Mid RCA lesion, 30 %stenosed.  Dist RCA lesion, 20 %stenosed.   1. 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 Patent LIMA graft to the mid LAD. The large Diagonal branch has severe ostial stenosis. The vein graft is patent and fills the Diagonal.  2. Moderate non-obstructive disease in the RCA 3. No disease noted in the Circumflex.  4. Normal LV systolic function  Recommendations: Continue medical management of CAD.      Patient Profile     Ruben Burton is a 75 y.o. male with a history of CAD s/p CABG, PAF, HTN, HLD who presented to [facility] ED for evaluation of Chest pain.  Assessment & Plan    1. Unstable angina - Cath showed chronic total occlusion of mid LAD; severe ostial stenosis to large diagonal and moderate non obstrctive RCA, normal LV function.  Patent LIMA to mid LAD and SVG to diagonal. Recommended medical therapy.  - Troponin negative. SCr normal post cath. Continue ASA 81mg , metoprolol 25mg  BID and Crestor 20mg  qd.  - His symptoms could be due to GERD, however not seems as typical symptoms. He takes nexium 40mg  qd at home, however his recently his co-pay went up to $100 for 3 months supply. Will try protonix 40mg  BID for one months and he will let us know during post hospital appointment.   2. Hypertension - Stable and well controlled on current regimen.  3.  HLD - Followed by PCP  4. Paroxysmal atrial fibrillation - No reoccurrence of atrial fibrillation since his postop CABG  in 2011. Maintaining inus rhythm.  5. Sinus bradycardia with PVCs and ventricular bigeminy on telemetry - Rate in mid 9s.  Asymptomatic. Continue BB. electrolytes normal.    Signed, Bhagat,Bhavinkumar, PA  10/17/2016, 9:54 AM   See DC summary  Sherren Mocha 10/17/2016 3:55 PM

## 2016-10-17 NOTE — Discharge Summary (Signed)
Discharge Summary    Patient ID: Ruben Burton,  MRN: [id number], DOB/AGE: Apr 14, 1941 75 y.o.  Admit date: 10/16/2016 Discharge date: 10/17/2016  Primary Care Provider: [facility] Primary Cardiologist: Dr. Angelena Form  Discharge Diagnoses    Active Problems:   Unstable angina pectoris [facility])   Precordial pain   Unstable angina (HCC)   Chest pain Sinus Bradycardiac with PVCs and ventricular bigemny  Allergies Allergies  Allergen Reactions  . Oxytetracycline Other (See Comments)    Passes out.    Diagnostic Studies/Procedures    Cath 10/16/16 Left Heart Cath and Cors/Grafts Angiography  Conclusion     The left ventricular ejection fraction is 50-55% by visual estimate.  The left ventricular systolic function is normal.  LV end diastolic pressure is normal.  There is no mitral valve regurgitation.  Ost 1st Diag lesion, 99 %stenosed.  SVG graft was visualized by angiography.  LIMA graft was visualized by angiography.  Mid LAD lesion, 100 %stenosed.  Ost RCA to Prox RCA lesion, 40 %stenosed.  Prox RCA to Mid RCA lesion, 30 %stenosed.  Dist RCA lesion, 20 %stenosed.  1. 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 Patent LIMA graft to the mid LAD. The large Diagonal branch has severe ostial stenosis. The vein graft is patent and fills the Diagonal.  2. Moderate non-obstructive disease in the RCA 3. No disease noted in the Circumflex.  4. Normal LV systolic function  Recommendations: Continue medical management of CAD.       History of Present Illness     Ruben Burton is a 75 y.o. male with a history of CAD s/p CABG, PAF, HTN, HLD who presented to [facility] ED 12/14/17for evaluation of Chest pain.  Patient was admitted 4/11 with a non-STEMI. He subsequently underwent CABG. Postop course was complicated by atrial fibrillation. Echo 4/11: EF 55-60%, mildly dilated RV. Followup with monitor was negative for recurrent atrial  fibrillation. Coumadin was discontinued. Stress myoview February 2016 with no ischemia. PVCs noted on EKG during stress.  He was seen by Dr. Irish Lack in clinic 01/2016 with complain of dizziness, palpitation and chest pain. His pain felt atypical consistent with musculoskeletal. Not like prior MI. and no intervention done.  He was doing well on cardiac stand point when last seen by Dr. Angelena Form 08/25/16.  He was in usual state of health up until about 2 weeks ago he has noted exertional "chest tightness" that relieved with rest. Also noted some fatigue and tiredness recently. Today he had substernal chest pain while walking from house to his workshop at house. He does wood work. His pain exacerbated by lifting some wood, "not heavy". He went inside home and took sublingual nitroglycerin x 1 with improvement of chest pressure from 8 out of 10 to 5 out of 10. He became flush after words and hagitated to took another nitroglycerin. Denies associated nausea, vomiting or radiation of pain. He call office and advised to come to the ER. Here he was given another nitroglycerin x 1 with minimal relief. His chest pain resolved after giving IV morphine. He states that this episode is similar to when he had a CABG, however, less severe. This is the first time he required sublingual nitroglycerin after CABG.  EKG shows normal sinus rhythm at a rate of 61 bpm. Point-of-care troponin x 2 negative. Lytes and serum creatinine normal. Chest x-ray clear.  Denies use of tobacco abuse, alcohol abuse or illicit drug.   Hospital Course  Consultants: None  1. Unstable angina - Cath showed chronic total occlusion of mid LAD; severe ostial stenosis to large diagonal and moderate non obstrctive RCA, normal LV function.  Patent LIMA to mid LAD and SVG to diagonal. Recommended medical therapy.  - Troponin negative. SCr normal post cath. Continue ASA 81mg , metoprolol 25mg  BID and Crestor 20mg  qd.  - His symptoms  could be due to GERD, however not seems as typical symptoms. He takes nexium 40mg  qd at home, however his recently his co-pay went up to $100 for 3 months supply. Will try protonix 40mg  BID for one months and he will let us know during post hospital appointment.   2. Hypertension - Stable and well controlled on current regimen.  3. HLD - Followed by PCP  4. Paroxysmal atrial fibrillation - No reoccurrence of atrial fibrillation since his postop CABG in 2011. Maintaining inus rhythm.  5. Sinus bradycardia with PVCs and ventricular bigeminy on telemetry - Rate in mid 35s. Asymptomatic. Continue BB. electrolytes normal.   The patient has been seen by Dr.Isiaih Hollenbach today and deemed ready for discharge home. All follow-up appointments have been scheduled. Discharge medications are listed below.  _____________   Discharge Vitals Blood pressure 121/62, pulse 64, temperature 97.2 F (36.2 C), temperature source Oral, resp. rate 16, height 6\' 1"  (1.854 m), weight 191 lb 5.8 oz (86.8 kg), SpO2 98 %.  Filed Weights   10/16/16 1025 10/17/16 0127  Weight: 185 lb (83.9 kg) 191 lb 5.8 oz (86.8 kg)    Labs & Radiologic Studies     CBC  Recent Labs  10/16/16 1038 10/17/16 0257  WBC 4.7 5.9  NEUTROABS 3.0  --   HGB 14.8 13.8  HCT 42.4 39.9  MCV 94.0 94.3  PLT 142* [id number]*   Basic Metabolic Panel  Recent Labs  10/16/16 1038 10/17/16 0257  NA 141 140  K 4.1 3.6  CL 109 109  CO2 26 26  GLUCOSE 107* 111*  BUN 11 7  CREATININE 1.10 0.88  CALCIUM 8.7* 8.3*   Liver Function Tests  Recent Labs  10/16/16 1038  AST 21  ALT 15*  ALKPHOS 47  BILITOT 1.4*  PROT 5.9*  ALBUMIN 3.6    Recent Labs  10/16/16 1038  LIPASE 17   Cardiac Enzymes No results for input(s): CKTOTAL, CKMB, CKMBINDEX, TROPONINI in the last 72 hours. BNP Invalid input(s): POCBNP D-Dimer No results for input(s): DDIMER in the last 72 hours. Hemoglobin A1C No results for input(s): HGBA1C in the last 72  hours. Fasting Lipid Panel No results for input(s): CHOL, HDL, LDLCALC, TRIG, CHOLHDL, LDLDIRECT in the last 72 hours. Thyroid Function Tests No results for input(s): TSH, T4TOTAL, T3FREE, THYROIDAB in the last 72 hours.  Invalid input(s): FREET3  Dg Chest 2 View  Result Date: 10/16/2016 CLINICAL DATA:  Central chest pain for several hours, initial encounter EXAM: CHEST  2 VIEW COMPARISON:  07/14/2016 FINDINGS: Cardiac shadow is within normal limits. Postsurgical changes are noted. Scarring is noted in the left lung base. No focal infiltrate or sizable effusion is seen. No bony abnormality is noted IMPRESSION: No active cardiopulmonary disease. Electronically Signed   By: Inez Catalina M.D.   On: 10/16/2016 11:33    Disposition   Pt is being discharged home today in good condition.  Follow-up Plans & Appointments    Follow-up Information    [place], [place]. Go on 10/17/2016.   Specialty:  Cardiology Why:  @10 :30 for post hospital, please arrive at 10:15am  Contact information: [address] [place] [address] [phone]          Discharge Instructions    Diet - low sodium heart healthy    Complete by:  As directed    Discharge instructions    Complete by:  As directed    No driving for 48 hours. No lifting over 5 lbs for 1 week. No sexual activity for 1 week. You may return to work on 10/22/16. Keep procedure site clean & dry. If you notice increased pain, swelling, bleeding or pus, call/return!  You may shower, but no soaking baths/hot tubs/pools for 1 week.   Increase activity slowly    Complete by:  As directed       Discharge Medications   Current Discharge Medication List    START taking these medications   Details  pantoprazole (PROTONIX) 40 MG tablet Take 1 tablet (40 mg total) by mouth 2 (two) times daily. Qty: 60 tablet, Refills: 1      CONTINUE these medications which have NOT CHANGED   Details  aspirin EC 81 MG tablet Take 81 mg  by mouth daily.    calcium carbonate (OS-CAL) 600 MG TABS Take 600 mg by mouth daily.     metoprolol (LOPRESSOR) 25 MG tablet Take one tablet by mouth twice daily. Qty: 180 tablet, Refills: 3   Associated Diagnoses: HTN (hypertension)    nitroGLYCERIN (NITROSTAT) 0.4 MG SL tablet Place 1 tablet (0.4 mg total) under the tongue every 5 (five) minutes as needed for chest pain. Qty: 25 tablet, Refills: 11   Associated Diagnoses: Coronary atherosclerosis of native coronary artery    rosuvastatin (CRESTOR) 20 MG tablet Take 1 tablet (20 mg total) by mouth daily. Qty: 90 tablet, Refills: 3    tamsulosin (FLOMAX) 0.4 MG CAPS capsule Take 1 capsule by mouth daily.      STOP taking these medications     NEXIUM 40 MG capsule           Outstanding Labs/Studies   None  Duration of Discharge Encounter   Greater than 30 minutes including physician time.  Signed, Bhagat,Bhavinkumar PA-C 10/17/2016, 10:50 AM  Patient seen, examined. Available data reviewed. Agree with findings, assessment, and plan as outlined by Robbie Lis, PA-C. Exam reveals an alert and oriented male in no distress. Lungs are clear. Heart is regular rate and rhythm without murmur or gallop. Left radial site is clear. There is no peripheral edema. Cardiac cath study from yesterday reviewed and shows stable anatomy with patent grafts. Patient has had no further chest discomfort. Suspect he had noncardiac chest pain. He will be started on Protonix 40 mg daily. Otherwise will continue his current medical regimen. Cardiac follow-up has been arranged.  Sherren Mocha, M.D. 10/17/2016 11:32 AM

## 2016-10-17 NOTE — Care Management Obs Status (Signed)
[facility] NOTIFICATION   Patient Details  Name: Ruben Burton MRN: [id number] Date of Birth: [birth year]   Medicare Observation Status Notification Given:  Yes    Zenon Mayo, RN 10/17/2016, 10:10 AM

## 2016-10-17 NOTE — Care Management Note (Signed)
Case Management Note  Patient Details  Name: Ruben Burton MRN: [id number] Date of Birth: May 13, 1941  Subjective/Objective:    S/p heart cath, pta indep, lives with wife, no needs.                 Action/Plan:   Expected Discharge Date:                  Expected Discharge Plan:  Home/Self Care  In-House Referral:     Discharge planning Services  CM Consult  Post Acute Care Choice:    Choice offered to:     DME Arranged:    DME Agency:     HH Arranged:    HH Agency:     Status of Service:  Completed, signed off  If discussed at [employer] of Stay Meetings, dates discussed:    Additional Comments:  Zenon Mayo, RN 10/17/2016, 10:10 AM

## 2016-10-29 NOTE — Progress Notes (Signed)
Cardiology Office Note    Date:  10/31/2016   ID:  DOLAN EVILSIZOR, DOB 05-May-1941, MRN [id number]  PCP:  Thressa Sheller, MD  Cardiologist:  Dr. Angelena Form  Chief Complaint: Hospital follow up s/p   cath  History of Present Illness:   Ruben Burton is a 75 y.o. male CAD s/p CABG, PAF, HTN, HLD and recent admission for unstable angina who presented for follow up.   Patient was admitted 4/11 with a non-STEMI. He subsequently underwent CABG. Postop course was complicated by atrial fibrillation. Echo 4/11: EF 55-60%, mildly dilated RV. Followup with monitor was negative for recurrent atrial fibrillation. Coumadin was discontinued. Stress myoview February 2016 with no ischemia. PVCs noted on EKG during stress.  Came to ER 10/16/16 with 2 weeks hx of worsening exertional "chest tightness" that relieved with rest. Also noted some fatigue and tiredness recently. He states that this episode is similar to when he had a CABG, however, less severe. This is the first time he required sublingual nitroglycerin after CABG. Cath showed chronic total occlusion of mid LAD; severe ostial stenosis to large diagonal andmoderate non obstrctive RCA, normal LV function. Patent LIMA to mid LAD and SVG to diagonal. Recommended medical therapy. Symptoms could be GERD. Nexium changed to protonix GERD due to cost. Telemetry showed sinus bradycardia with PVCs and ventricular bigeminy. Electrolytes normal. Asymptomatic. Continue BB.  Here today for follow up. Intermittent chest tightness. Lasting for about 30 seconds and self resolved. Improving. No associated symptoms. The patient denies nausea, vomiting, fever, chest pain, palpitations, shortness of breath, orthopnea, PND, dizziness, syncope, cough, congestion, abdominal pain, hematochezia, melena, lower extremity edema.   Past Medical History:  Diagnosis Date  . Arthritis    "mild; fingers" (10/16/2016)  . Coronary artery disease    a. CABG in 2011 b. Cath  10/16/16 - chronic total occlusion of mid LAD with patietn LIMA to mid LAD; severe ostial stenosis to large diagonal with patent SVG to diagonal; moderate non obstrctive RCA, normal LV function. Tx Rx  . GERD (gastroesophageal reflux disease)   . Hx of cardiovascular stress test    a. ETT-MV 1/14: no ischemia; not gated  . Hyperlipidemia   . Hypertension   . Paroxysmal atrial fibrillation (HCC)   . Pleural effusion     Past Surgical History:  Procedure Laterality Date  . CARDIAC CATHETERIZATION  ~ 2011; 10/16/2016  . CARDIAC CATHETERIZATION N/A 10/16/2016   Procedure: Left Heart Cath and Cors/Grafts Angiography;  Surgeon: Burnell Blanks, MD;  Location: [facility];  Service: Cardiovascular;  Laterality: N/A;  . CHOLECYSTECTOMY OPEN  ~ 1992  . COLONOSCOPY N/A 12/01/2013   Procedure: COLONOSCOPY;  Surgeon: Rogene Houston, MD;  Location: [facility];  Service: Endoscopy;  Laterality: N/A;  830  . CORONARY ARTERY BYPASS GRAFT  02-13-10   2V (LIMA to LAD, SVG to diagonal)  . ESOPHAGOGASTRODUODENOSCOPY (EGD) WITH ESOPHAGEAL DILATION    . EXPLORATORY LAPAROTOMY  ~ 1995   for perforated duodenal ulcer  . HYDROCELE EXCISION      Current Medications: Prior to Admission medications   Medication Sig Start Date End Date Taking? Authorizing Provider  aspirin EC 81 MG tablet Take 81 mg by mouth daily.    Historical Provider, MD  calcium carbonate (OS-CAL) 600 MG TABS Take 600 mg by mouth daily.     Historical Provider, MD  metoprolol (LOPRESSOR) 25 MG tablet Take one tablet by mouth twice daily. 05/09/11   Burnell Blanks,  MD  nitroGLYCERIN (NITROSTAT) 0.4 MG SL tablet Place 1 tablet (0.4 mg total) under the tongue every 5 (five) minutes as needed for chest pain. 01/10/14 10/16/16  Burnell Blanks, MD  pantoprazole (PROTONIX) 40 MG tablet Take 1 tablet (40 mg total) by mouth 2 (two) times daily. 10/17/16   Dontai Pember, PA  rosuvastatin (CRESTOR) 20 MG tablet Take  1 tablet (20 mg total) by mouth daily. 01/22/16   Burnell Blanks, MD  tamsulosin (FLOMAX) 0.4 MG CAPS capsule Take 1 capsule by mouth daily. 07/07/16   Historical Provider, MD    Allergies:   Oxytetracycline   Social History   Social History  . Marital status: Married    Spouse name: N/A  . Number of children: 2  . Years of education: N/A   Occupational History  . retirede Retired   Social History Main Topics  . Smoking status: Former Smoker    Packs/day: 0.25    Years: 1.00    Types: Cigarettes    Quit date: 61  . Smokeless tobacco: Never Used  . Alcohol use No  . Drug use: No  . Sexual activity: Yes   Other Topics Concern  . None   Social History Narrative  . None     Family History:  The patient's family history includes Heart attack in his brother; Hypertension in his brother, father, and mother; Other in his brother and mother; Stroke in his father.   ROS:   Please see the history of present illness.    ROS All other systems reviewed and are negative.   PHYSICAL EXAM:   VS:  BP 126/70   Pulse (!) 53   Ht 6\' 1"  (1.854 m)   Wt 193 lb (87.5 kg)   SpO2 97%   BMI 25.46 kg/m    GEN: Well nourished, well developed, in no acute distress  HEENT: normal  Neck: no JVD, carotid bruits, or masses Cardiac: RRR; no murmurs, rubs, or gallops,no edema. L radial cath site clear Respiratory:  clear to auscultation bilaterally, normal work of breathing GI: soft, nontender, nondistended, + BS MS: no deformity or atrophy  Skin: warm and dry, no rash Neuro:  Alert and Oriented x 3, Strength and sensation are intact Psych: euthymic mood, full affect  Wt Readings from Last 3 Encounters:  10/31/16 193 lb (87.5 kg)  10/17/16 191 lb 5.8 oz (86.8 kg)  09/04/16 185 lb (83.9 kg)      Studies/Labs Reviewed:   EKG:  EKG is not ordered today.    Recent Labs: 10/16/2016: ALT 15 10/17/2016: BUN 7; Creatinine, Ser 0.88; Hemoglobin 13.8; Platelets 131; Potassium 3.6;  Sodium 140   Lipid Panel    Component Value Date/Time   CHOL 116 05/14/2010 0908   TRIG 141.0 05/14/2010 0908   HDL 32.80 (L) 05/14/2010 0908   CHOLHDL 4 05/14/2010 0908   VLDL 28.2 05/14/2010 0908   LDLCALC 55 05/14/2010 0908    Additional studies/ records that were reviewed today include:   Echocardiogram:  Cath 10/16/16 Left Heart Cath and Cors/Grafts Angiography  Conclusion     The left ventricular ejection fraction is 50-55% by visual estimate.  The left ventricular systolic function is normal.  LV end diastolic pressure is normal.  There is no mitral valve regurgitation.  Ost 1st Diag lesion, 99 %stenosed.  SVG graft was visualized by angiography.  LIMA graft was visualized by angiography.  Mid LAD lesion, 100 %stenosed.  Ost RCA to Prox RCA lesion, 40 %  stenosed.  Prox RCA to Mid RCA lesion, 30 %stenosed.  Dist RCA lesion, 20 %stenosed.  1. 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 Patent LIMA graft to the mid LAD. The large Diagonal branch has severe ostial stenosis. The vein graft is patent and fills the Diagonal.  2. Moderate non-obstructive disease in the RCA 3. No disease noted in the Circumflex.  4. Normal LV systolic function  Recommendations: Continue medical management of CAD.       ASSESSMENT & PLAN:    1. CAD s/p CABG - Cath 12/17  showed chronic total occlusion of mid LAD; severe ostial stenosis to large diagonal andmoderate non obstrctive RCA, normal LV function. Patent LIMA to mid LAD and SVG to diagonal. Recommended medical therapy. - Continue  ASA 81mg , metoprolol 25mg  BID and Crestor 20mg  qd.  - Intermittent chest tightness that has been improving. Discussed possible initiation of Imdur. Due to headache on SL nitro will hold now. He will let us know if worsening symptoms.   2. HTN - Stable and well controlled on current regimen.  3. HLD - Followed by PCP  4. Paroxysmal atrial fibrillation -  No reoccurrence of atrial fibrillation since his postop CABG in 2011. Maintaining inus rhythm.  5. Sinus bradycardia - Rate in mid 62s. Asymptomatic. Continue BB.     Medication Adjustments/Labs and Tests Ordered: Current medicines are reviewed at length with the patient today.  Concerns regarding medicines are outlined above.  Medication changes, Labs and Tests ordered today are listed in the Patient Instructions below. There are no Patient Instructions on file for this visit.   Jarrett Soho, [place]  10/31/2016 8:18 AM    [address], [place], [place]  [address] Phone: [phone]; Fax: [phone]

## 2016-10-31 ENCOUNTER — Ambulatory Visit (INDEPENDENT_AMBULATORY_CARE_PROVIDER_SITE_OTHER): Payer: Medicare Other | Admitting: Physician Assistant

## 2016-10-31 ENCOUNTER — Ambulatory Visit: Payer: Medicare Other | Admitting: Physician Assistant

## 2016-10-31 ENCOUNTER — Encounter: Payer: Self-pay | Admitting: Physician Assistant

## 2016-10-31 VITALS — BP 126/70 | HR 53 | Ht 73.0 in | Wt 193.0 lb

## 2016-10-31 DIAGNOSIS — I25810 Atherosclerosis of coronary artery bypass graft(s) without angina pectoris: Secondary | ICD-10-CM | POA: Diagnosis not present

## 2016-10-31 DIAGNOSIS — R001 Bradycardia, unspecified: Secondary | ICD-10-CM

## 2016-10-31 DIAGNOSIS — I480 Paroxysmal atrial fibrillation: Secondary | ICD-10-CM | POA: Diagnosis not present

## 2016-10-31 DIAGNOSIS — E785 Hyperlipidemia, unspecified: Secondary | ICD-10-CM

## 2016-10-31 DIAGNOSIS — I10 Essential (primary) hypertension: Secondary | ICD-10-CM | POA: Diagnosis not present

## 2016-10-31 MED ORDER — PANTOPRAZOLE SODIUM 40 MG PO TBEC: 40.0000 mg | DELAYED_RELEASE_TABLET | Freq: Every day | ORAL | 3 refills | Status: DC

## 2016-10-31 NOTE — Patient Instructions (Signed)
REFILL FOR PROTONIX WAS SENT IN TO [facility]  3 MONTHS WITH DR. Angelena Form  NO OTHER CHANGES TO BE MADE AT THIS TIME

## 2016-11-13 ENCOUNTER — Telehealth: Payer: Self-pay | Admitting: Cardiovascular Disease

## 2016-11-13 NOTE — Telephone Encounter (Addendum)
rosuvastatin (CRESTOR) 20 MG tablet  Medication  Date: 01/22/2016 Department: [address] Office Ordering/Authorizing: Burnell Blanks, MD  Order Providers   Prescribing Provider Encounter Provider  Burnell Blanks, MD Thompson Grayer, RN  Medication Detail    Disp Refills Start End   rosuvastatin (CRESTOR) 20 MG tablet 90 tablet 3 01/22/2016    Sig - Route: Take 1 tablet (20 mg total) by mouth daily. - Oral   E-Prescribing Status: Receipt confirmed by pharmacy (01/22/2016 8:09 AM EDT)   Pharmacy   [facility] [place] on file.

## 2016-11-13 NOTE — Telephone Encounter (Signed)
°  New Prob   *STAT* If patient is at the pharmacy, call can be transferred to refill team.   1. Which medications need to be refilled? (please list name of each medication and dose if known) rosuvastatin (CRESTOR) 20 MG tablet  2. Which pharmacy/location (including street and city if local pharmacy) is medication to be sent to? Mail order- [facility] Fax: [phone]  3. Do they need a 30 day or 90 day supply? 90 days

## 2016-11-25 DIAGNOSIS — I10 Essential (primary) hypertension: Secondary | ICD-10-CM | POA: Diagnosis not present

## 2016-11-25 DIAGNOSIS — H35033 Hypertensive retinopathy, bilateral: Secondary | ICD-10-CM | POA: Diagnosis not present

## 2016-11-25 DIAGNOSIS — H25811 Combined forms of age-related cataract, right eye: Secondary | ICD-10-CM | POA: Diagnosis not present

## 2016-11-25 DIAGNOSIS — H2512 Age-related nuclear cataract, left eye: Secondary | ICD-10-CM | POA: Diagnosis not present

## 2016-12-08 DIAGNOSIS — E538 Deficiency of other specified B group vitamins: Secondary | ICD-10-CM | POA: Diagnosis not present

## 2016-12-08 DIAGNOSIS — Z125 Encounter for screening for malignant neoplasm of prostate: Secondary | ICD-10-CM | POA: Diagnosis not present

## 2016-12-08 DIAGNOSIS — I10 Essential (primary) hypertension: Secondary | ICD-10-CM | POA: Diagnosis not present

## 2016-12-08 DIAGNOSIS — I129 Hypertensive chronic kidney disease with stage 1 through stage 4 chronic kidney disease, or unspecified chronic kidney disease: Secondary | ICD-10-CM | POA: Diagnosis not present

## 2016-12-08 DIAGNOSIS — M8580 Other specified disorders of bone density and structure, unspecified site: Secondary | ICD-10-CM | POA: Diagnosis not present

## 2016-12-08 DIAGNOSIS — E559 Vitamin D deficiency, unspecified: Secondary | ICD-10-CM | POA: Diagnosis not present

## 2016-12-08 DIAGNOSIS — Z0000 Encounter for general adult medical examination without abnormal findings: Secondary | ICD-10-CM | POA: Diagnosis not present

## 2016-12-08 DIAGNOSIS — E785 Hyperlipidemia, unspecified: Secondary | ICD-10-CM | POA: Diagnosis not present

## 2016-12-15 DIAGNOSIS — D696 Thrombocytopenia, unspecified: Secondary | ICD-10-CM | POA: Diagnosis not present

## 2016-12-15 DIAGNOSIS — Z0000 Encounter for general adult medical examination without abnormal findings: Secondary | ICD-10-CM | POA: Diagnosis not present

## 2016-12-15 DIAGNOSIS — I10 Essential (primary) hypertension: Secondary | ICD-10-CM | POA: Diagnosis not present

## 2016-12-15 DIAGNOSIS — I700 Atherosclerosis of aorta: Secondary | ICD-10-CM | POA: Diagnosis not present

## 2016-12-15 DIAGNOSIS — R7989 Other specified abnormal findings of blood chemistry: Secondary | ICD-10-CM | POA: Diagnosis not present

## 2016-12-15 DIAGNOSIS — N182 Chronic kidney disease, stage 2 (mild): Secondary | ICD-10-CM | POA: Diagnosis not present

## 2016-12-15 DIAGNOSIS — I480 Paroxysmal atrial fibrillation: Secondary | ICD-10-CM | POA: Diagnosis not present

## 2017-01-20 ENCOUNTER — Encounter: Payer: Self-pay | Admitting: Cardiovascular Disease

## 2017-01-30 ENCOUNTER — Ambulatory Visit (INDEPENDENT_AMBULATORY_CARE_PROVIDER_SITE_OTHER): Payer: Medicare Other | Admitting: Cardiovascular Disease

## 2017-01-30 ENCOUNTER — Encounter: Payer: Self-pay | Admitting: Cardiovascular Disease

## 2017-01-30 VITALS — BP 104/68 | HR 53 | Ht 73.0 in | Wt 192.2 lb

## 2017-01-30 DIAGNOSIS — I25810 Atherosclerosis of coronary artery bypass graft(s) without angina pectoris: Secondary | ICD-10-CM | POA: Diagnosis not present

## 2017-01-30 DIAGNOSIS — I480 Paroxysmal atrial fibrillation: Secondary | ICD-10-CM | POA: Diagnosis not present

## 2017-01-30 DIAGNOSIS — E7800 Pure hypercholesterolemia, unspecified: Secondary | ICD-10-CM

## 2017-01-30 DIAGNOSIS — I10 Essential (primary) hypertension: Secondary | ICD-10-CM

## 2017-01-30 NOTE — Progress Notes (Signed)
Chief Complaint  Patient presents with  . Follow-up     History of Present Illness: 76 yo male with history of CAD s/p CABG in 2011, HTN, HLD here today for follow up.  He was admitted in April 2011 with a NSTEMI and was found to have severe disease in the LAD and Diagonal. He underwent 2V CABG with LIMA to LAD and SVG to Diagonal. Postop course was complicated by atrial fibrillation. Echo 2011 with LVEF 55-60%, mildly dilated RV. No evidence of atrial fib on post-op cardiac monitor. Coumadin was discontinued. Stress myoview February 2016 with no ischemia. PVCs noted on EKG during stress. Admitted to [facility] December 2017 with chest pain. Cardiac cath 10/16/16 with stable CAD (occluded mid LAD and Diagonal with patent LIMA to LAD and patent SVG to Diagonal). The RCA had mild to moderate plaque. The Circumflex have no obstructive disease. PVCs noted on monitor during hospitalization.   He is here today for follow up. No exertional chest pains or SOB.  Feeling well  Primary Care Physician: Thressa Sheller, MD    Past Medical History:  Diagnosis Date  . Arthritis    "mild; fingers" (10/16/2016)  . Coronary artery disease    a. CABG in 2011 b. Cath 10/16/16 - chronic total occlusion of mid LAD with patietn LIMA to mid LAD; severe ostial stenosis to large diagonal with patent SVG to diagonal; moderate non obstrctive RCA, normal LV function. Tx Rx  . GERD (gastroesophageal reflux disease)   . Hx of cardiovascular stress test    a. ETT-MV 1/14: no ischemia; not gated  . Hyperlipidemia   . Hypertension   . Paroxysmal atrial fibrillation (HCC)   . Pleural effusion     Past Surgical History:  Procedure Laterality Date  . CARDIAC CATHETERIZATION  ~ 2011; 10/16/2016  . CARDIAC CATHETERIZATION N/A 10/16/2016   Procedure: Left Heart Cath and Cors/Grafts Angiography;  Surgeon: Burnell Blanks, MD;  Location: [place] [facility];  Service: Cardiovascular;  Laterality: N/A;  . CHOLECYSTECTOMY  OPEN  ~ 1992  . COLONOSCOPY N/A 12/01/2013   Procedure: COLONOSCOPY;  Surgeon: Rogene Houston, MD;  [facility];  Service: Endoscopy;  Laterality: N/A;  830  . CORONARY ARTERY BYPASS GRAFT  02-13-10   2V (LIMA to LAD, SVG to diagonal)  . ESOPHAGOGASTRODUODENOSCOPY (EGD) WITH ESOPHAGEAL DILATION    . EXPLORATORY LAPAROTOMY  ~ 1995   for perforated duodenal ulcer  . HYDROCELE EXCISION      Current Outpatient Prescriptions  Medication Sig Dispense Refill  . aspirin EC 81 MG tablet Take 81 mg by mouth daily.    . calcium carbonate (OS-CAL) 600 MG TABS Take 600 mg by mouth daily.     . metoprolol (LOPRESSOR) 25 MG tablet Take one tablet by mouth twice daily. 180 tablet 3  . nitroGLYCERIN (NITROSTAT) 0.4 MG SL tablet Place 1 tablet (0.4 mg total) under the tongue every 5 (five) minutes as needed for chest pain. 25 tablet 11  . pantoprazole (PROTONIX) 40 MG tablet Take 1 tablet (40 mg total) by mouth daily. 90 tablet 3  . rosuvastatin (CRESTOR) 20 MG tablet Take 1 tablet (20 mg total) by mouth daily. 90 tablet 3  . tamsulosin (FLOMAX) 0.4 MG CAPS capsule Take 1 capsule by mouth daily.     No current facility-administered medications for this visit.     Allergies  Allergen Reactions  . Oxytetracycline Other (See Comments)    Passes out.    Social  History   Social History  . Marital status: Married    Spouse name: N/A  . Number of children: 2  . Years of education: N/A   Occupational History  . retirede Retired   Social History Main Topics  . Smoking status: Former Smoker    Packs/day: 0.25    Years: 1.00    Types: Cigarettes    Quit date: 60  . Smokeless tobacco: Never Used  . Alcohol use No  . Drug use: No  . Sexual activity: Yes   Other Topics Concern  . Not on file   Social History Narrative  . No narrative on file    Family History  Problem Relation Age of Onset  . Other Mother     alive- healthy  . Hypertension Mother   . Stroke Father      deceased  . Hypertension Father   . Other Brother     CABG  . Heart attack Brother   . Hypertension Brother   . Colon cancer Neg Hx     Review of Systems:  As stated in the HPI and otherwise negative.   BP 104/68   Pulse (!) 53   Ht 6\' 1"  (1.854 m)   Wt 192 lb 3.2 oz (87.2 kg)   SpO2 95%   BMI 25.36 kg/m   Physical Examination: General: Well developed, well nourished, NAD  HEENT: OP clear, mucus membranes moist  SKIN: warm, dry. No rashes. Neuro: No focal deficits  Musculoskeletal: Muscle strength 5/5 all ext  Psychiatric: Mood and affect normal  Neck: No JVD, no carotid bruits, no thyromegaly, no lymphadenopathy.  Lungs:Clear bilaterally, no wheezes, rhonci, crackles Cardiovascular: Regular rate and rhythm. No murmurs, gallops or rubs. Abdomen:Soft. Bowel sounds present. Non-tender.  Extremities: No lower extremity edema. Pulses are 2 + in the bilateral DP/PT.  Cardiac cath December 2017: Diagnostic Diagram          EKG:  EKG is not  ordered today. The ekg ordered today demonstrates  Recent Labs: 10/16/2016: ALT 15 10/17/2016: BUN 7; Creatinine, Ser 0.88; Hemoglobin 13.8; Platelets 131; Potassium 3.6; Sodium 140     Wt Readings from Last 3 Encounters:  01/30/17 192 lb 3.2 oz (87.2 kg)  10/31/16 193 lb (87.5 kg)  10/17/16 191 lb 5.8 oz (86.8 kg)     Other studies Reviewed: Additional studies/ records that were reviewed today include:. Review of the above records demonstrates:   Assessment and Plan:   1. Coronary Artery Disease without angina: No chest pain suggestive of angina. Cardiac cath December 2017 with stable CAD. Continue aspirin, beta blocker and statin.   2. Hypertension: BP is controlled. Continue current therapy.   3. Hyperlipidemia: Managed by primary care. Continue statin.   4. Paroxysmal atrial fibrillation: Sinus today. No recurrence of atrial fibrillation since his post-op period in 2011.   Current medicines are reviewed at length  with the patient today.  The patient does not have concerns regarding medicines.  The following changes have been made:  no change  Labs/ tests ordered today include:  No orders of the defined types were placed in this encounter.   Disposition:   FU with me in 6 months  Signed, [name], MD 01/30/2017 9:27 AM    [facility] [place], [place],   [address] Phone: [phone]; Fax: [phone]

## 2017-01-30 NOTE — Patient Instructions (Signed)

## 2017-02-04 ENCOUNTER — Telehealth: Payer: Self-pay

## 2017-02-04 NOTE — Telephone Encounter (Signed)
**Note De-Identified Kalyn Dimattia Obfuscation** We received approval on PA for the pts NTG from [employer]. Approval good from 10/16/2016 until 01/22/2018.

## 2017-03-26 DIAGNOSIS — I6529 Occlusion and stenosis of unspecified carotid artery: Secondary | ICD-10-CM | POA: Diagnosis not present

## 2017-03-26 DIAGNOSIS — I10 Essential (primary) hypertension: Secondary | ICD-10-CM | POA: Diagnosis not present

## 2017-03-26 DIAGNOSIS — W57XXXA Bitten or stung by nonvenomous insect and other nonvenomous arthropods, initial encounter: Secondary | ICD-10-CM | POA: Diagnosis not present

## 2017-03-26 DIAGNOSIS — R21 Rash and other nonspecific skin eruption: Secondary | ICD-10-CM | POA: Diagnosis not present

## 2017-04-03 DIAGNOSIS — W57XXXD Bitten or stung by nonvenomous insect and other nonvenomous arthropods, subsequent encounter: Secondary | ICD-10-CM | POA: Diagnosis not present

## 2017-04-03 DIAGNOSIS — R21 Rash and other nonspecific skin eruption: Secondary | ICD-10-CM | POA: Diagnosis not present

## 2017-04-03 DIAGNOSIS — I6529 Occlusion and stenosis of unspecified carotid artery: Secondary | ICD-10-CM | POA: Diagnosis not present

## 2017-04-07 DIAGNOSIS — L821 Other seborrheic keratosis: Secondary | ICD-10-CM | POA: Diagnosis not present

## 2017-04-07 DIAGNOSIS — L814 Other melanin hyperpigmentation: Secondary | ICD-10-CM | POA: Diagnosis not present

## 2017-04-07 DIAGNOSIS — D225 Melanocytic nevi of trunk: Secondary | ICD-10-CM | POA: Diagnosis not present

## 2017-04-07 DIAGNOSIS — L820 Inflamed seborrheic keratosis: Secondary | ICD-10-CM | POA: Diagnosis not present

## 2017-04-07 DIAGNOSIS — R202 Paresthesia of skin: Secondary | ICD-10-CM | POA: Diagnosis not present

## 2017-04-07 DIAGNOSIS — L570 Actinic keratosis: Secondary | ICD-10-CM | POA: Diagnosis not present

## 2017-04-18 ENCOUNTER — Other Ambulatory Visit: Payer: Self-pay | Admitting: Cardiovascular Disease

## 2017-04-20 NOTE — Telephone Encounter (Signed)
Okay to refill or should this be deferred to patients pcp? Please advise. Thanks, MI 

## 2017-04-21 NOTE — Telephone Encounter (Signed)
OK to refill.  I have requested most recent lab work from [employer]

## 2017-04-27 DIAGNOSIS — R3121 Asymptomatic microscopic hematuria: Secondary | ICD-10-CM | POA: Diagnosis not present

## 2017-04-27 DIAGNOSIS — N400 Enlarged prostate without lower urinary tract symptoms: Secondary | ICD-10-CM | POA: Diagnosis not present

## 2017-05-27 DIAGNOSIS — E559 Vitamin D deficiency, unspecified: Secondary | ICD-10-CM | POA: Diagnosis not present

## 2017-05-27 DIAGNOSIS — E785 Hyperlipidemia, unspecified: Secondary | ICD-10-CM | POA: Diagnosis not present

## 2017-05-27 DIAGNOSIS — Z125 Encounter for screening for malignant neoplasm of prostate: Secondary | ICD-10-CM | POA: Diagnosis not present

## 2017-05-27 DIAGNOSIS — I10 Essential (primary) hypertension: Secondary | ICD-10-CM | POA: Diagnosis not present

## 2017-06-03 DIAGNOSIS — I700 Atherosclerosis of aorta: Secondary | ICD-10-CM | POA: Diagnosis not present

## 2017-06-03 DIAGNOSIS — N182 Chronic kidney disease, stage 2 (mild): Secondary | ICD-10-CM | POA: Diagnosis not present

## 2017-06-03 DIAGNOSIS — I480 Paroxysmal atrial fibrillation: Secondary | ICD-10-CM | POA: Diagnosis not present

## 2017-06-03 DIAGNOSIS — D696 Thrombocytopenia, unspecified: Secondary | ICD-10-CM | POA: Diagnosis not present

## 2017-06-03 DIAGNOSIS — H6120 Impacted cerumen, unspecified ear: Secondary | ICD-10-CM | POA: Diagnosis not present

## 2017-06-11 DIAGNOSIS — H25811 Combined forms of age-related cataract, right eye: Secondary | ICD-10-CM | POA: Diagnosis not present

## 2017-06-11 DIAGNOSIS — H2512 Age-related nuclear cataract, left eye: Secondary | ICD-10-CM | POA: Diagnosis not present

## 2017-07-15 DIAGNOSIS — Z23 Encounter for immunization: Secondary | ICD-10-CM | POA: Diagnosis not present

## 2017-08-05 DIAGNOSIS — I6529 Occlusion and stenosis of unspecified carotid artery: Secondary | ICD-10-CM | POA: Diagnosis not present

## 2017-08-05 DIAGNOSIS — H6123 Impacted cerumen, bilateral: Secondary | ICD-10-CM | POA: Diagnosis not present

## 2017-10-06 DIAGNOSIS — H2511 Age-related nuclear cataract, right eye: Secondary | ICD-10-CM | POA: Diagnosis not present

## 2017-10-06 DIAGNOSIS — H2513 Age-related nuclear cataract, bilateral: Secondary | ICD-10-CM | POA: Diagnosis not present

## 2017-10-06 DIAGNOSIS — H18413 Arcus senilis, bilateral: Secondary | ICD-10-CM | POA: Diagnosis not present

## 2017-10-06 DIAGNOSIS — H25013 Cortical age-related cataract, bilateral: Secondary | ICD-10-CM | POA: Diagnosis not present

## 2017-10-06 DIAGNOSIS — H25043 Posterior subcapsular polar age-related cataract, bilateral: Secondary | ICD-10-CM | POA: Diagnosis not present

## 2017-10-07 ENCOUNTER — Telehealth: Payer: Self-pay | Admitting: Cardiovascular Disease

## 2017-10-07 NOTE — Telephone Encounter (Signed)
   Primary Cardiologist: No primary care provider on file.  Chart reviewed as part of pre-operative protocol coverage. Given past medical history and time since last visit, based on ACC/AHA guidelines, LENFORD BEDDOW would be at acceptable risk for the planned procedure without further cardiovascular testing.   I will route this recommendation to the requesting party via Epic fax function and remove from pre-op pool.  Please call with questions.  Kerin Ransom, PA-C 10/07/2017, 4:10 PM

## 2017-10-07 NOTE — Telephone Encounter (Signed)
° °  [facility] Pre-operative Risk Assessment    Request for surgical clearance:  1. What type of surgery is being performed? Cataract extraction w/intraocular lens implantation of right eye followed by left eye  2. When is this surgery scheduled? 11/06/17  3. Are there any medications that need to be held prior to surgery and how long? Pt does not need to stop any medications.    4. Practice name and name of physician performing surgery? [facility] Dr. Darleen Crocker    5. What is your office phone and fax number? PH# [phone], FAX# [phone]   6. Anesthesia type (None, local, MAC, general) ? Topical anesthesia w/IV medication    Derl Barrow 10/07/2017, 2:49 PM  _________________________________________________________________   (provider comments below)

## 2017-10-13 NOTE — Progress Notes (Signed)
Chief Complaint  Patient presents with  . Coronary Artery Disease     History of Present Illness: 76 yo male with history of CAD s/p CABG in 2011, HTN, HLD and post-op atrial fibrillation here today for follow up.  He was admitted in April 2011 with a NSTEMI and was found to have severe disease in the LAD and Diagonal. He underwent 2V CABG with LIMA to LAD and SVG to Diagonal. Postop course was complicated by atrial fibrillation. Echo 2011 with LVEF 55-60%, mildly dilated RV. No evidence of atrial fib on post-op cardiac monitor. Coumadin was discontinued. Stress myoview February 2016 with no ischemia. PVCs noted on EKG during stress. Admitted to [facility] December 2017 with chest pain. Cardiac cath 10/16/16 with stable CAD (occluded mid LAD and Diagonal with patent LIMA to LAD and patent SVG to Diagonal). The RCA had mild to moderate plaque. The Circumflex have no obstructive disease. PVCs noted on monitor during hospitalization.   He is here today for follow up. The patient denies any chest pain, dyspnea, palpitations, lower extremity edema, orthopnea, PND, dizziness, near syncope or syncope.   Primary Care Physician: Thressa Sheller, MD    Past Medical History:  Diagnosis Date  . Arthritis    "mild; fingers" (10/16/2016)  . Coronary artery disease    a. CABG in 2011 b. Cath 10/16/16 - chronic total occlusion of mid LAD with patietn LIMA to mid LAD; severe ostial stenosis to large diagonal with patent SVG to diagonal; moderate non obstrctive RCA, normal LV function. Tx Rx  . GERD (gastroesophageal reflux disease)   . Hx of cardiovascular stress test    a. ETT-MV 1/14: no ischemia; not gated  . Hyperlipidemia   . Hypertension   . Paroxysmal atrial fibrillation (HCC)   . Pleural effusion     Past Surgical History:  Procedure Laterality Date  . CARDIAC CATHETERIZATION  ~ 2011; 10/16/2016  . CARDIAC CATHETERIZATION N/A 10/16/2016   Procedure: Left Heart Cath and Cors/Grafts Angiography;   Surgeon: Burnell Blanks, MD;  Location: [place] [facility];  Service: Cardiovascular;  Laterality: N/A;  . CHOLECYSTECTOMY OPEN  ~ 1992  . COLONOSCOPY N/A 12/01/2013   Procedure: COLONOSCOPY;  Surgeon: Rogene Houston, MD;  [facility];  Service: Endoscopy;  Laterality: N/A;  830  . CORONARY ARTERY BYPASS GRAFT  02-13-10   2V (LIMA to LAD, SVG to diagonal)  . ESOPHAGOGASTRODUODENOSCOPY (EGD) WITH ESOPHAGEAL DILATION    . EXPLORATORY LAPAROTOMY  ~ 1995   for perforated duodenal ulcer  . HYDROCELE EXCISION      Current Outpatient Medications  Medication Sig Dispense Refill  . aspirin EC 81 MG tablet Take 81 mg by mouth daily.    . calcium carbonate (OS-CAL) 600 MG TABS Take 600 mg by mouth 3 (three) times a week.     . esomeprazole (NEXIUM) 40 MG capsule Take 40 mg by mouth daily at 12 noon.    . metoprolol (LOPRESSOR) 25 MG tablet Take one tablet by mouth twice daily. 180 tablet 3  . nitroGLYCERIN (NITROSTAT) 0.4 MG SL tablet Place 1 tablet (0.4 mg total) under the tongue every 5 (five) minutes as needed for chest pain. 25 tablet 11  . rosuvastatin (CRESTOR) 20 MG tablet TAKE 1 TABLET DAILY 90 tablet 3  . tamsulosin (FLOMAX) 0.4 MG CAPS capsule Take 1 capsule by mouth daily.     No current facility-administered medications for this visit.     Allergies  Allergen Reactions  .  Oxytetracycline Other (See Comments)    Passes out.    Social History   Socioeconomic History  . Marital status: Married    Spouse name: Not on file  . Number of children: 2  . Years of education: Not on file  . Highest education level: Not on file  Social Needs  . Financial resource strain: Not on file  . Food insecurity - worry: Not on file  . Food insecurity - inability: Not on file  . Transportation needs - medical: Not on file  . Transportation needs - non-medical: Not on file  Occupational History  . Occupation: retirede    Fish farm manager: RETIRED  Tobacco Use  . Smoking status:  Former Smoker    Packs/day: 0.25    Years: 1.00    Pack years: 0.25    Types: Cigarettes    Last attempt to quit: 1960    Years since quitting: 58.9  . Smokeless tobacco: Never Used  Substance and Sexual Activity  . Alcohol use: No  . Drug use: No  . Sexual activity: Yes  Other Topics Concern  . Not on file  Social History Narrative  . Not on file    Family History  Problem Relation Age of Onset  . Other Mother        alive- healthy  . Hypertension Mother   . Stroke Father        deceased  . Hypertension Father   . Other Brother        CABG  . Heart attack Brother   . Hypertension Brother   . Colon cancer Neg Hx     Review of Systems:  As stated in the HPI and otherwise negative.   BP 100/70   Pulse (!) 53   Resp (!) 96   Ht 6\' 1"  (1.854 m)   Wt 194 lb 9.6 oz (88.3 kg)   BMI 25.67 kg/m   Physical Examination:  General: Well developed, well nourished, NAD  HEENT: OP clear, mucus membranes moist  SKIN: warm, dry. No rashes. Neuro: No focal deficits  Musculoskeletal: Muscle strength 5/5 all ext  Psychiatric: Mood and affect normal  Neck: No JVD, no carotid bruits, no thyromegaly, no lymphadenopathy.  Lungs:Clear bilaterally, no wheezes, rhonci, crackles Cardiovascular: Regular rate and rhythm. No murmurs, gallops or rubs. Abdomen:Soft. Bowel sounds present. Non-tender.  Extremities: No lower extremity edema. Pulses are 2 + in the bilateral DP/PT.  Cardiac cath December 2017: Diagnostic Diagram          EKG:  EKG is ordered today. The ekg ordered today demonstrates sinus brady, rate 53 bpm. Diffuse T wave flattening.   Recent Labs: 10/16/2016: ALT 15 10/17/2016: BUN 7; Creatinine, Ser 0.88; Hemoglobin 13.8; Platelets 131; Potassium 3.6; Sodium 140   Lipids followed in primary care   Wt Readings from Last 3 Encounters:  10/15/17 194 lb 9.6 oz (88.3 kg)  01/30/17 192 lb 3.2 oz (87.2 kg)  10/31/16 193 lb (87.5 kg)     Other studies  Reviewed: Additional studies/ records that were reviewed today include:. Review of the above records demonstrates:   Assessment and Plan:   1. Coronary Artery Disease without angina: He is having no chest pain suggestive of angina. Cardiac cath in December 2017 with stable CAD. Will continue ASA, statin and beta blocker.  Will repeat echo now to assess LV function.   2. Hypertension: BP is controlled. No changes today   3. Hyperlipidemia: Followed in primary care. Continue statin.  4. Paroxysmal atrial fibrillation: He is in sinus today. He has had no known recurrences of atrial fibrillation since his post-op period in 2011. Continue beta blocker.    Current medicines are reviewed at length with the patient today.  The patient does not have concerns regarding medicines.  The following changes have been made:  no change  Labs/ tests ordered today include:   Orders Placed This Encounter  Procedures  . EKG 12-Lead  . ECHOCARDIOGRAM COMPLETE    Disposition:   FU with me in 6 months  Signed, [name], MD 10/15/2017 9:06 AM    [facility] [place], [place], [place]  [address] Phone: [phone]; Fax: [phone]

## 2017-10-15 ENCOUNTER — Ambulatory Visit (INDEPENDENT_AMBULATORY_CARE_PROVIDER_SITE_OTHER): Payer: Medicare Other | Admitting: Cardiovascular Disease

## 2017-10-15 ENCOUNTER — Encounter: Payer: Self-pay | Admitting: Cardiovascular Disease

## 2017-10-15 VITALS — BP 100/70 | HR 53 | Resp 96 | Ht 73.0 in | Wt 194.6 lb

## 2017-10-15 DIAGNOSIS — I10 Essential (primary) hypertension: Secondary | ICD-10-CM | POA: Diagnosis not present

## 2017-10-15 DIAGNOSIS — E7800 Pure hypercholesterolemia, unspecified: Secondary | ICD-10-CM | POA: Diagnosis not present

## 2017-10-15 DIAGNOSIS — I480 Paroxysmal atrial fibrillation: Secondary | ICD-10-CM | POA: Diagnosis not present

## 2017-10-15 DIAGNOSIS — I25810 Atherosclerosis of coronary artery bypass graft(s) without angina pectoris: Secondary | ICD-10-CM | POA: Diagnosis not present

## 2017-10-15 NOTE — Patient Instructions (Addendum)

## 2017-10-19 ENCOUNTER — Ambulatory Visit (HOSPITAL_COMMUNITY): Payer: Medicare Other | Attending: Cardiovascular Disease

## 2017-10-19 ENCOUNTER — Other Ambulatory Visit: Payer: Self-pay

## 2017-10-19 DIAGNOSIS — I25810 Atherosclerosis of coronary artery bypass graft(s) without angina pectoris: Secondary | ICD-10-CM | POA: Insufficient documentation

## 2017-10-19 DIAGNOSIS — I071 Rheumatic tricuspid insufficiency: Secondary | ICD-10-CM | POA: Insufficient documentation

## 2017-11-02 DIAGNOSIS — I10 Essential (primary) hypertension: Secondary | ICD-10-CM | POA: Diagnosis not present

## 2017-11-02 DIAGNOSIS — M2669 Other specified disorders of temporomandibular joint: Secondary | ICD-10-CM | POA: Diagnosis not present

## 2017-11-02 DIAGNOSIS — I6529 Occlusion and stenosis of unspecified carotid artery: Secondary | ICD-10-CM | POA: Diagnosis not present

## 2017-11-02 DIAGNOSIS — H9201 Otalgia, right ear: Secondary | ICD-10-CM | POA: Diagnosis not present

## 2017-11-06 DIAGNOSIS — H2512 Age-related nuclear cataract, left eye: Secondary | ICD-10-CM | POA: Diagnosis not present

## 2017-11-06 DIAGNOSIS — H2511 Age-related nuclear cataract, right eye: Secondary | ICD-10-CM | POA: Diagnosis not present

## 2017-11-20 DIAGNOSIS — H2512 Age-related nuclear cataract, left eye: Secondary | ICD-10-CM | POA: Diagnosis not present

## 2018-01-27 DIAGNOSIS — E7849 Other hyperlipidemia: Secondary | ICD-10-CM | POA: Diagnosis not present

## 2018-01-27 DIAGNOSIS — I10 Essential (primary) hypertension: Secondary | ICD-10-CM | POA: Diagnosis not present

## 2018-01-27 DIAGNOSIS — I700 Atherosclerosis of aorta: Secondary | ICD-10-CM | POA: Diagnosis not present

## 2018-01-27 DIAGNOSIS — I25810 Atherosclerosis of coronary artery bypass graft(s) without angina pectoris: Secondary | ICD-10-CM | POA: Diagnosis not present

## 2018-01-27 DIAGNOSIS — Z8679 Personal history of other diseases of the circulatory system: Secondary | ICD-10-CM | POA: Diagnosis not present

## 2018-01-27 DIAGNOSIS — Z1389 Encounter for screening for other disorder: Secondary | ICD-10-CM | POA: Diagnosis not present

## 2018-01-27 DIAGNOSIS — D696 Thrombocytopenia, unspecified: Secondary | ICD-10-CM | POA: Diagnosis not present

## 2018-01-27 DIAGNOSIS — Z6826 Body mass index (BMI) 26.0-26.9, adult: Secondary | ICD-10-CM | POA: Diagnosis not present

## 2018-01-27 DIAGNOSIS — E538 Deficiency of other specified B group vitamins: Secondary | ICD-10-CM | POA: Diagnosis not present

## 2018-01-27 DIAGNOSIS — I252 Old myocardial infarction: Secondary | ICD-10-CM | POA: Diagnosis not present

## 2018-01-27 DIAGNOSIS — E039 Hypothyroidism, unspecified: Secondary | ICD-10-CM | POA: Diagnosis not present

## 2018-03-03 ENCOUNTER — Ambulatory Visit
Admission: RE | Admit: 2018-03-03 | Discharge: 2018-03-03 | Disposition: A | Discharge status: Home / Self Care | Payer: Medicare Other | Source: Ambulatory Visit | Attending: Internal Medicine | Admitting: Internal Medicine

## 2018-03-03 ENCOUNTER — Other Ambulatory Visit: Payer: Self-pay | Admitting: Internal Medicine

## 2018-03-03 DIAGNOSIS — R918 Other nonspecific abnormal finding of lung field: Secondary | ICD-10-CM | POA: Diagnosis not present

## 2018-03-03 DIAGNOSIS — R911 Solitary pulmonary nodule: Secondary | ICD-10-CM

## 2018-04-14 DIAGNOSIS — R3121 Asymptomatic microscopic hematuria: Secondary | ICD-10-CM | POA: Diagnosis not present

## 2018-04-14 DIAGNOSIS — R3912 Poor urinary stream: Secondary | ICD-10-CM | POA: Diagnosis not present

## 2018-04-14 DIAGNOSIS — N401 Enlarged prostate with lower urinary tract symptoms: Secondary | ICD-10-CM | POA: Diagnosis not present

## 2018-04-14 DIAGNOSIS — R351 Nocturia: Secondary | ICD-10-CM | POA: Diagnosis not present

## 2018-04-15 DIAGNOSIS — L821 Other seborrheic keratosis: Secondary | ICD-10-CM | POA: Diagnosis not present

## 2018-04-15 DIAGNOSIS — L819 Disorder of pigmentation, unspecified: Secondary | ICD-10-CM | POA: Diagnosis not present

## 2018-04-15 DIAGNOSIS — L814 Other melanin hyperpigmentation: Secondary | ICD-10-CM | POA: Diagnosis not present

## 2018-04-15 DIAGNOSIS — D1801 Hemangioma of skin and subcutaneous tissue: Secondary | ICD-10-CM | POA: Diagnosis not present

## 2018-04-15 DIAGNOSIS — L820 Inflamed seborrheic keratosis: Secondary | ICD-10-CM | POA: Diagnosis not present

## 2018-05-10 ENCOUNTER — Encounter: Payer: Self-pay | Admitting: Cardiovascular Disease

## 2018-05-10 ENCOUNTER — Ambulatory Visit (INDEPENDENT_AMBULATORY_CARE_PROVIDER_SITE_OTHER): Payer: Medicare Other | Admitting: Cardiovascular Disease

## 2018-05-10 VITALS — BP 120/70 | HR 51 | Ht 73.0 in | Wt 191.4 lb

## 2018-05-10 DIAGNOSIS — I25810 Atherosclerosis of coronary artery bypass graft(s) without angina pectoris: Secondary | ICD-10-CM

## 2018-05-10 DIAGNOSIS — E7800 Pure hypercholesterolemia, unspecified: Secondary | ICD-10-CM

## 2018-05-10 DIAGNOSIS — I480 Paroxysmal atrial fibrillation: Secondary | ICD-10-CM | POA: Diagnosis not present

## 2018-05-10 DIAGNOSIS — I10 Essential (primary) hypertension: Secondary | ICD-10-CM

## 2018-05-10 NOTE — Patient Instructions (Signed)

## 2018-05-10 NOTE — Progress Notes (Signed)
Chief Complaint  Patient presents with  . Follow-up    CAD     History of Present Illness: 77 yo male with history of CAD s/p CABG in 2011, HTN, HLD and post-op atrial fibrillation here today for follow up.  He was admitted in April 2011 with a NSTEMI and was found to have severe disease in the LAD and Diagonal. He underwent 2V CABG with LIMA to LAD and SVG to Diagonal. Postop course was complicated by atrial fibrillation. Echo 2011 with LVEF 55-60%, mildly dilated RV. No evidence of atrial fib on post-op cardiac monitor. Coumadin was discontinued. Stress myoview February 2016 with no ischemia. PVCs noted on EKG during stress. Admitted to [facility] December 2017 with chest pain. Cardiac cath 10/16/16 with stable CAD (occluded mid LAD and Diagonal with patent LIMA to LAD and patent SVG to Diagonal). The RCA had mild to moderate plaque. The Circumflex have no obstructive disease. PVCs noted on monitor during hospitalization. Echo December 2018 with normal LV systolic function, grade 2 diastolic dysfunction, no valve disease.   He is here today for follow up. The patient denies any chest pain, dyspnea, palpitations, lower extremity edema, orthopnea, PND, dizziness, near syncope or syncope.   Primary Care Physician: Marton Redwood, MD    Past Medical History:  Diagnosis Date  . Arthritis    "mild; fingers" (10/16/2016)  . CAD, ARTERY BYPASS GRAFT 03/07/2010   Qualifier: Diagnosis of  By: Orville Govern CMA, Carol    . Chest pain 10/16/2016  . Coronary artery disease    a. CABG in 2011 b. Cath 10/16/16 - chronic total occlusion of mid LAD with patietn LIMA to mid LAD; severe ostial stenosis to large diagonal with patent SVG to diagonal; moderate non obstrctive RCA, normal LV function. Tx Rx  . GERD 03/07/2010   Qualifier: Diagnosis of  By: Ronne Binning    . GERD (gastroesophageal reflux disease)   . Hx of cardiovascular stress test    a. ETT-MV 1/14: no ischemia; not gated  . Hyperlipidemia   .  HYPERLIPIDEMIA 03/07/2010   Qualifier: Diagnosis of  By: Ronne Binning    . Hypertension   . HYPERTENSION 03/07/2010   Qualifier: Diagnosis of  By: Ronne Binning    . PAROXYSMAL ATRIAL FIBRILLATION 03/07/2010   Qualifier: Diagnosis of  By: Orville Govern CMA, Carol    . Paroxysmal atrial fibrillation (HCC)   . Pleural effusion   . PLEURAL EFFUSION, LEFT 03/07/2010   Qualifier: Diagnosis of  By: Orville Govern CMA, Carol    . Precordial pain   . Unstable angina ([place]) 10/16/2016  . Unstable angina pectoris ([place]) 10/16/2016    Past Surgical History:  Procedure Laterality Date  . CARDIAC CATHETERIZATION  ~ 2011; 10/16/2016  . CARDIAC CATHETERIZATION N/A 10/16/2016   Procedure: Left Heart Cath and Cors/Grafts Angiography;  Surgeon: Burnell Blanks, MD;  Location: [facility] [facility];  Service: Cardiovascular;  Laterality: N/A;  . CHOLECYSTECTOMY OPEN  ~ 1992  . COLONOSCOPY N/A 12/01/2013   Procedure: COLONOSCOPY;  Surgeon: Rogene Houston, MD;  [facility];  Service: Endoscopy;  Laterality: N/A;  830  . CORONARY ARTERY BYPASS GRAFT  02-13-10   2V (LIMA to LAD, SVG to diagonal)  . ESOPHAGOGASTRODUODENOSCOPY (EGD) WITH ESOPHAGEAL DILATION    . EXPLORATORY LAPAROTOMY  ~ 1995   for perforated duodenal ulcer  . HYDROCELE EXCISION      Current Outpatient Medications  Medication Sig Dispense Refill  . aspirin EC  81 MG tablet Take 81 mg by mouth daily.    . calcium carbonate (OS-CAL) 600 MG TABS Take 600 mg by mouth 3 (three) times a week.     . esomeprazole (NEXIUM) 40 MG capsule Take 40 mg by mouth daily at 12 noon.    . metoprolol (LOPRESSOR) 25 MG tablet Take one tablet by mouth twice daily. 180 tablet 3  . nitroGLYCERIN (NITROSTAT) 0.4 MG SL tablet Place 1 tablet (0.4 mg total) under the tongue every 5 (five) minutes as needed for chest pain. 25 tablet 11  . rosuvastatin (CRESTOR) 20 MG tablet TAKE 1 TABLET DAILY 90 tablet 3  . tamsulosin (FLOMAX) 0.4 MG CAPS capsule Take 1 capsule by  mouth daily.     No current facility-administered medications for this visit.     Allergies  Allergen Reactions  . Oxytetracycline Other (See Comments)    Passes out.    Social History   Socioeconomic History  . Marital status: Married    Spouse name: Not on file  . Number of children: 2  . Years of education: Not on file  . Highest education level: Not on file  Occupational History  . Occupation: retirede    Fish farm manager: RETIRED  Social Needs  . Financial resource strain: Not on file  . Food insecurity:    Worry: Not on file    Inability: Not on file  . Transportation needs:    Medical: Not on file    Non-medical: Not on file  Tobacco Use  . Smoking status: Former Smoker    Packs/day: 0.25    Years: 1.00    Pack years: 0.25    Types: Cigarettes    Last attempt to quit: 1960    Years since quitting: 59.5  . Smokeless tobacco: Never Used  Substance and Sexual Activity  . Alcohol use: No  . Drug use: No  . Sexual activity: Yes  Lifestyle  . Physical activity:    Days per week: Not on file    Minutes per session: Not on file  . Stress: Not on file  Relationships  . Social connections:    Talks on phone: Not on file    Gets together: Not on file    Attends religious service: Not on file    Active member of club or organization: Not on file    Attends meetings of clubs or organizations: Not on file    Relationship status: Not on file  . Intimate partner violence:    Fear of current or ex partner: Not on file    Emotionally abused: Not on file    Physically abused: Not on file    Forced sexual activity: Not on file  Other Topics Concern  . Not on file  Social History Narrative  . Not on file    Family History  Problem Relation Age of Onset  . Other Mother        alive- healthy  . Hypertension Mother   . Stroke Father        deceased  . Hypertension Father   . Other Brother        CABG  . Heart attack Brother   . Hypertension Brother   . Colon  cancer Neg Hx     Review of Systems:  As stated in the HPI and otherwise negative.   BP 120/70   Pulse (!) 51   Ht 6\' 1"  (1.854 m)   Wt 191 lb 6.4 oz (86.8 kg)  SpO2 97%   BMI 25.25 kg/m   Physical Examination:  General: Well developed, well nourished, NAD  HEENT: OP clear, mucus membranes moist  SKIN: warm, dry. No rashes. Neuro: No focal deficits  Musculoskeletal: Muscle strength 5/5 all ext  Psychiatric: Mood and affect normal  Neck: No JVD, no carotid bruits, no thyromegaly, no lymphadenopathy.  Lungs:Clear bilaterally, no wheezes, rhonci, crackles Cardiovascular: Regular rate and rhythm. No murmurs, gallops or rubs. Abdomen:Soft. Bowel sounds present. Non-tender.  Extremities: No lower extremity edema. Pulses are 2 + in the bilateral DP/PT.  Cardiac cath December 2017: Diagnostic Diagram        Echo 10/19/17: Left ventricle: The cavity size was normal. Wall thickness was   normal. Systolic function was normal. The estimated ejection   fraction was in the range of 60% to 65%. Wall motion was normal;   there were no regional wall motion abnormalities. Features are   consistent with a pseudonormal left ventricular filling pattern,   with concomitant abnormal relaxation and increased filling   pressure (grade 2 diastolic dysfunction).  EKG:  EKG is not ordered today. The ekg ordered today demonstrates   Recent Labs: No results found for requested labs within last 8760 hours.   Lipids followed in primary care: July 2018 LDL 76  Total Chol: 139  HDL: 38. TG: 126   Wt Readings from Last 3 Encounters:  05/10/18 191 lb 6.4 oz (86.8 kg)  10/15/17 194 lb 9.6 oz (88.3 kg)  01/30/17 192 lb 3.2 oz (87.2 kg)     Other studies Reviewed: Additional studies/ records that were reviewed today include:. Review of the above records demonstrates:   Assessment and Plan:   1. Coronary Artery Disease without angina: No chest pain. Cardiac cath in December 2017 with stable  CAD.Echo December 2017 with normal LV systolic function. Continue ASA, beta blocker and statin.    2. Hypertension: BP is well controlled. No changes.   3. Hyperlipidemia: Lipids are managed in primary care. Continue statin.   4. Paroxysmal atrial fibrillation: Sinus today. He has had no known recurrences of atrial fibrillation since his post-op period in 2011. Will continue the beta blocker.   Current medicines are reviewed at length with the patient today.  The patient does not have concerns regarding medicines.  The following changes have been made:  no change  Labs/ tests ordered today include:   No orders of the defined types were placed in this encounter.   Disposition:   FU with me in 12 months  Signed, [name], MD 05/10/2018 8:22 AM    [facility] [place], [place], [place]  [address] Phone: [phone]; Fax: [phone]

## 2018-06-08 DIAGNOSIS — R82998 Other abnormal findings in urine: Secondary | ICD-10-CM | POA: Diagnosis not present

## 2018-06-08 DIAGNOSIS — I10 Essential (primary) hypertension: Secondary | ICD-10-CM | POA: Diagnosis not present

## 2018-06-08 DIAGNOSIS — M859 Disorder of bone density and structure, unspecified: Secondary | ICD-10-CM | POA: Diagnosis not present

## 2018-06-08 DIAGNOSIS — E038 Other specified hypothyroidism: Secondary | ICD-10-CM | POA: Diagnosis not present

## 2018-06-08 DIAGNOSIS — Z125 Encounter for screening for malignant neoplasm of prostate: Secondary | ICD-10-CM | POA: Diagnosis not present

## 2018-06-08 DIAGNOSIS — E538 Deficiency of other specified B group vitamins: Secondary | ICD-10-CM | POA: Diagnosis not present

## 2018-06-08 DIAGNOSIS — E7849 Other hyperlipidemia: Secondary | ICD-10-CM | POA: Diagnosis not present

## 2018-06-09 DIAGNOSIS — Z1212 Encounter for screening for malignant neoplasm of rectum: Secondary | ICD-10-CM | POA: Diagnosis not present

## 2018-06-15 DIAGNOSIS — D692 Other nonthrombocytopenic purpura: Secondary | ICD-10-CM | POA: Diagnosis not present

## 2018-06-15 DIAGNOSIS — Z23 Encounter for immunization: Secondary | ICD-10-CM | POA: Diagnosis not present

## 2018-06-15 DIAGNOSIS — Z0000 Encounter for general adult medical examination without abnormal findings: Secondary | ICD-10-CM | POA: Diagnosis not present

## 2018-06-15 DIAGNOSIS — I10 Essential (primary) hypertension: Secondary | ICD-10-CM | POA: Diagnosis not present

## 2018-06-15 DIAGNOSIS — D696 Thrombocytopenia, unspecified: Secondary | ICD-10-CM | POA: Diagnosis not present

## 2018-06-15 DIAGNOSIS — Z1389 Encounter for screening for other disorder: Secondary | ICD-10-CM | POA: Diagnosis not present

## 2018-06-15 DIAGNOSIS — Z6826 Body mass index (BMI) 26.0-26.9, adult: Secondary | ICD-10-CM | POA: Diagnosis not present

## 2018-06-15 DIAGNOSIS — I25810 Atherosclerosis of coronary artery bypass graft(s) without angina pectoris: Secondary | ICD-10-CM | POA: Diagnosis not present

## 2018-06-15 DIAGNOSIS — I700 Atherosclerosis of aorta: Secondary | ICD-10-CM | POA: Diagnosis not present

## 2018-06-15 DIAGNOSIS — R918 Other nonspecific abnormal finding of lung field: Secondary | ICD-10-CM | POA: Diagnosis not present

## 2018-06-15 DIAGNOSIS — E7849 Other hyperlipidemia: Secondary | ICD-10-CM | POA: Diagnosis not present

## 2018-06-15 DIAGNOSIS — Z8679 Personal history of other diseases of the circulatory system: Secondary | ICD-10-CM | POA: Diagnosis not present

## 2018-07-24 ENCOUNTER — Other Ambulatory Visit: Payer: Self-pay | Admitting: Cardiovascular Disease

## 2018-07-26 IMAGING — CT CT CHEST W/O CM
3 of 4 series · 17 of 30 positions shown, 19 images · non-contrast
Comparison: None.

CLINICAL DATA: RIGHT lung nodule

EXAM:
CT CHEST WITHOUT CONTRAST
TECHNIQUE: Multidetector CT imaging of the chest was performed following the
standard protocol without IV contrast.

[Series 3: chest w/o · axial · non-contrast · 0.77mm/px · z∈[-279,-54]mm · 6 of 128 slices shown]
[im 19/128  lung]
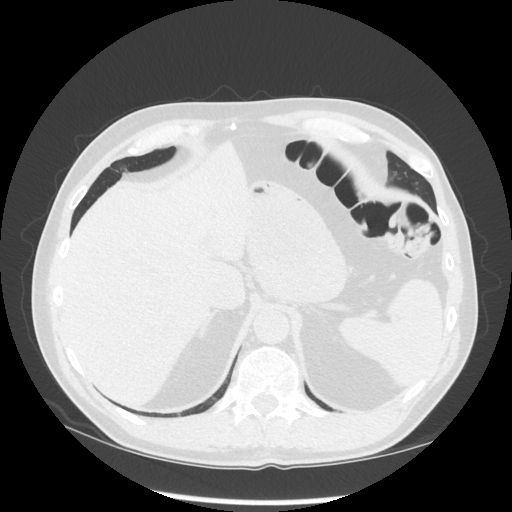
[im 37/128  lung]
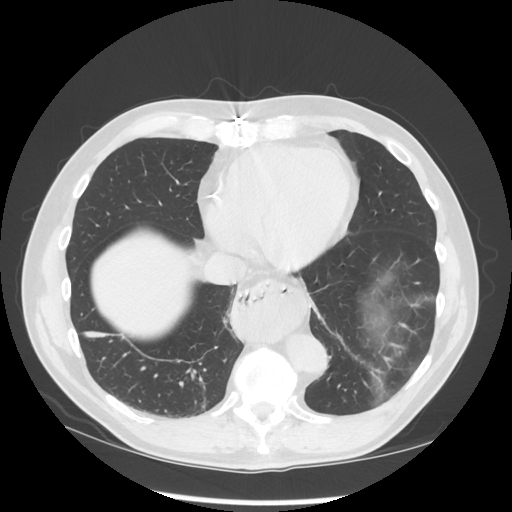
[im 55/128  lung]
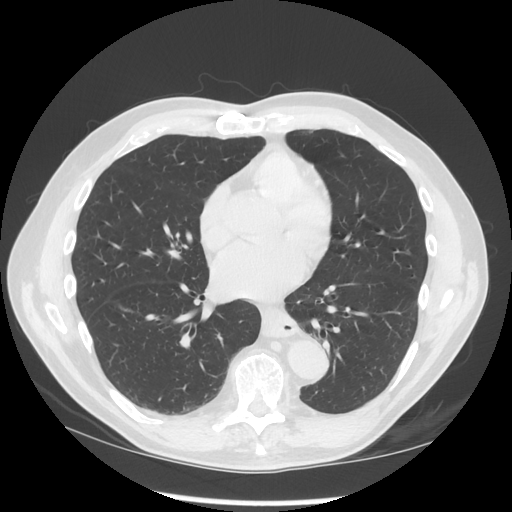
[im 73/128  lung]
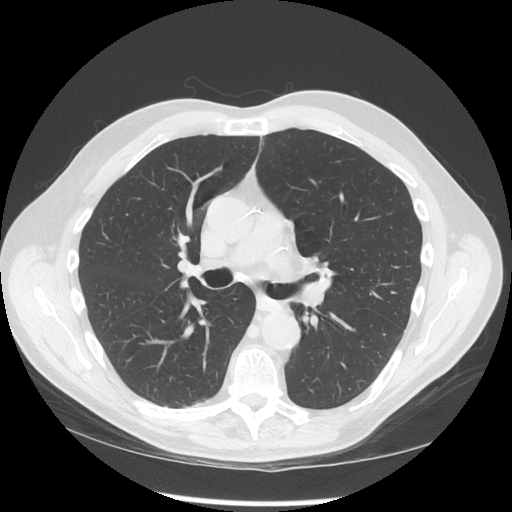
[im 91/128  lung]
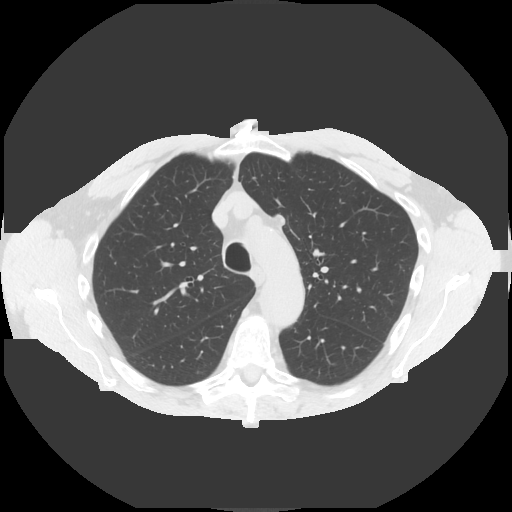
[im 109/128  lung]
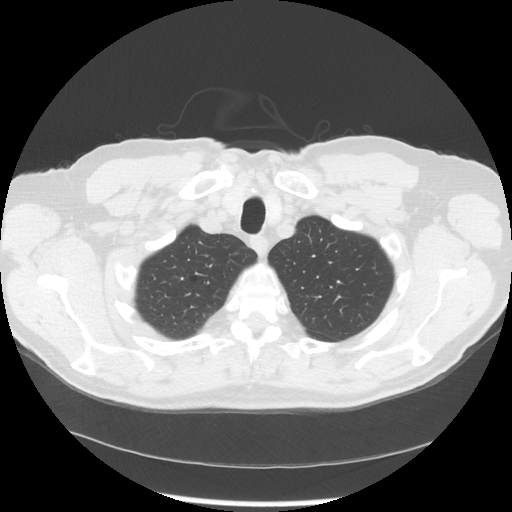

[Series 4: lung windows · axial · 0.77mm/px · z∈[-286,-46]mm · 7 of 128 slices shown, 9 images]
[im 16/128  mediastinal]
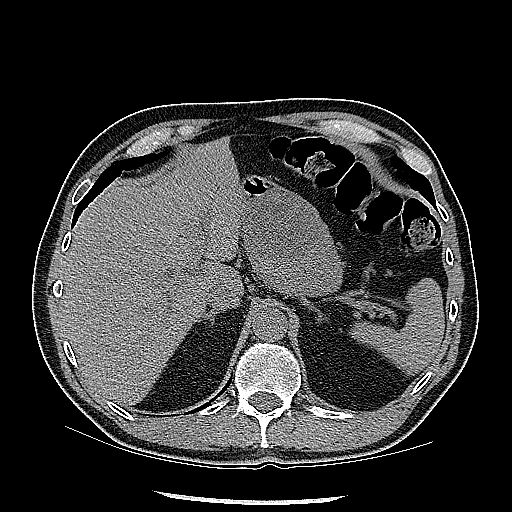
[im 16/128  lung]
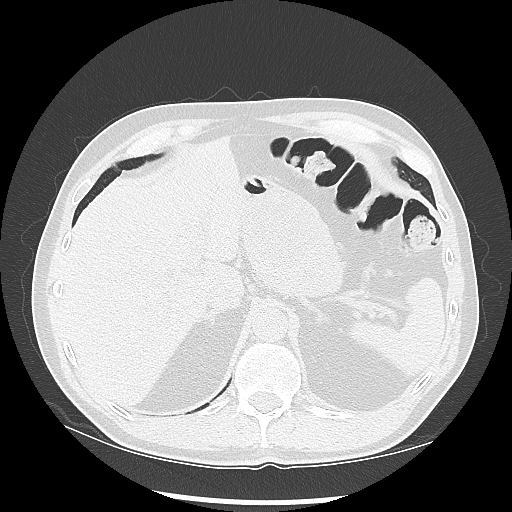
[im 32/128  lung]
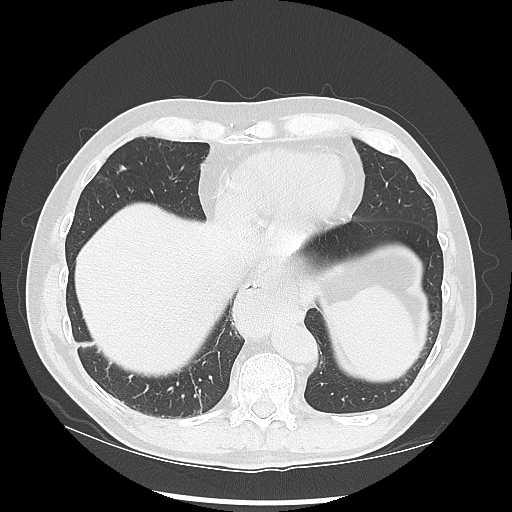
[im 48/128  lung]
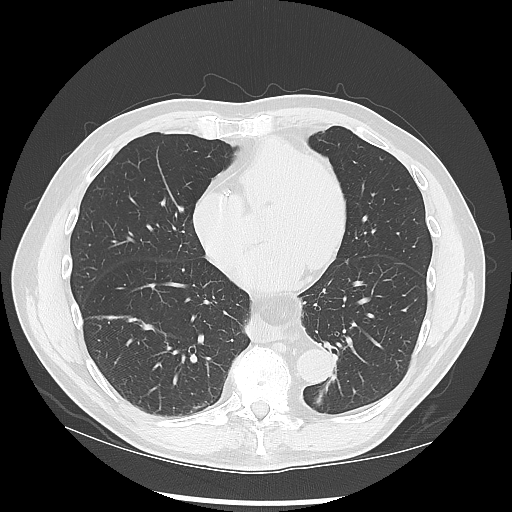
[im 64/128  lung]
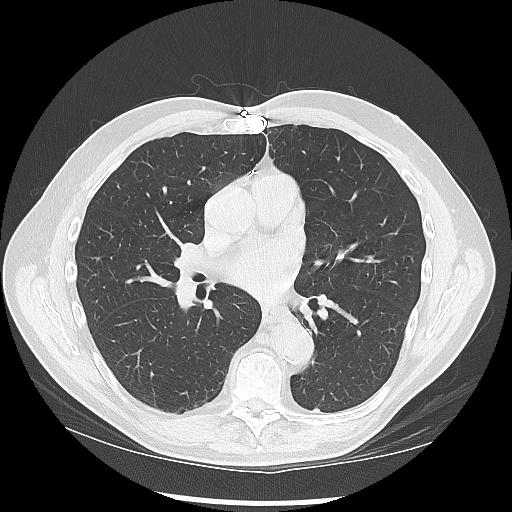
[im 80/128  mediastinal]
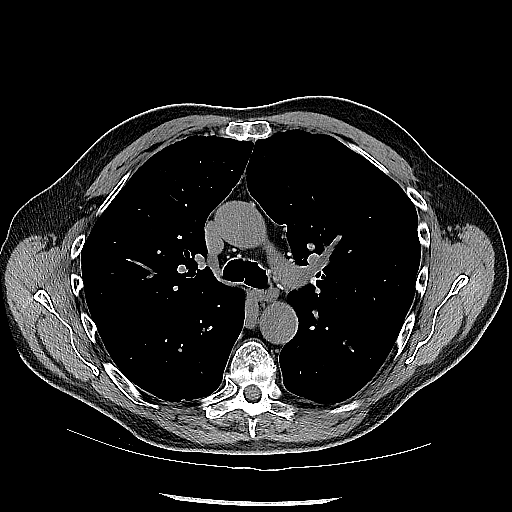
[im 80/128  lung]
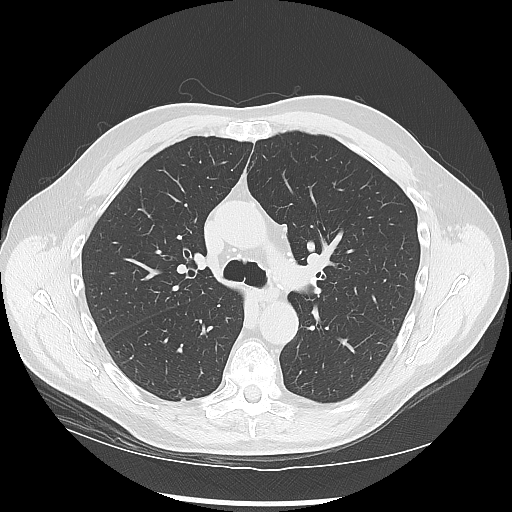
[im 96/128  lung]
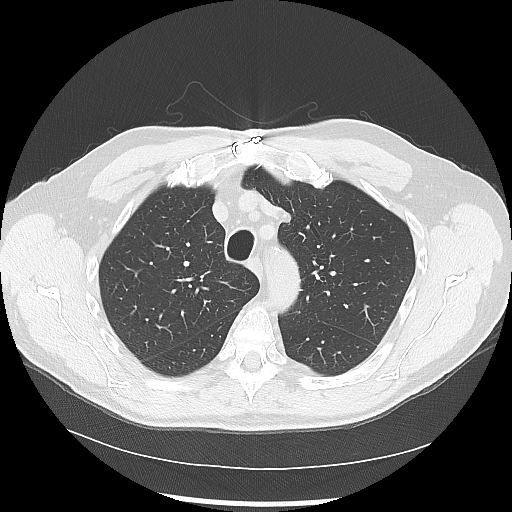
[im 112/128  lung]
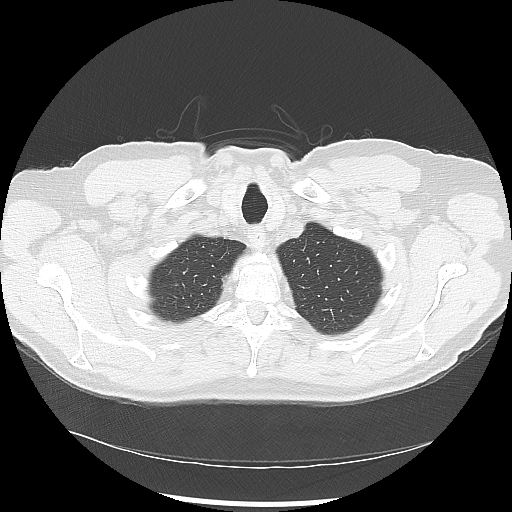

[Series 602: sagittal body · sagittal · 0.77mm/px · 4 of 157 slices shown]
[im 16/157  mediastinal]
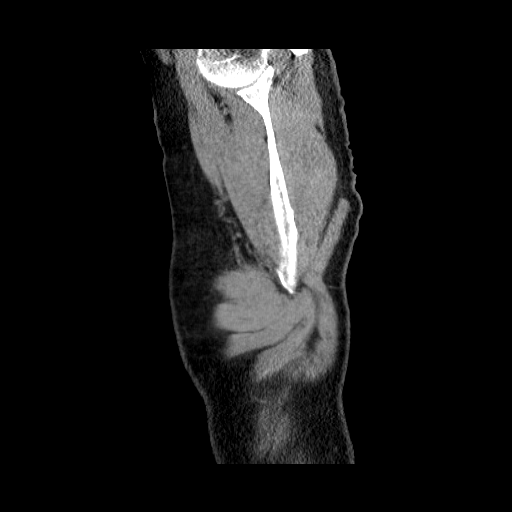
[im 32/157  mediastinal]
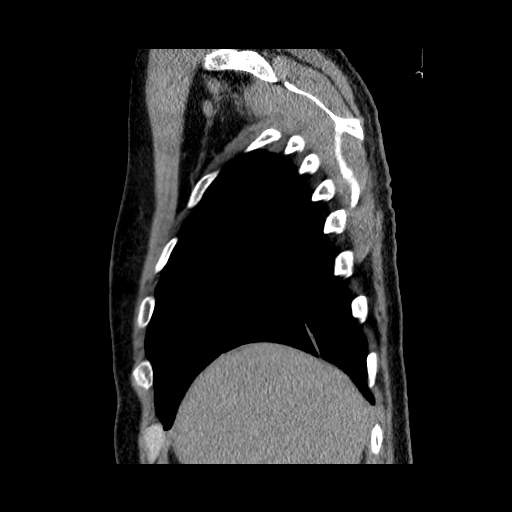
[im 47/157  mediastinal]
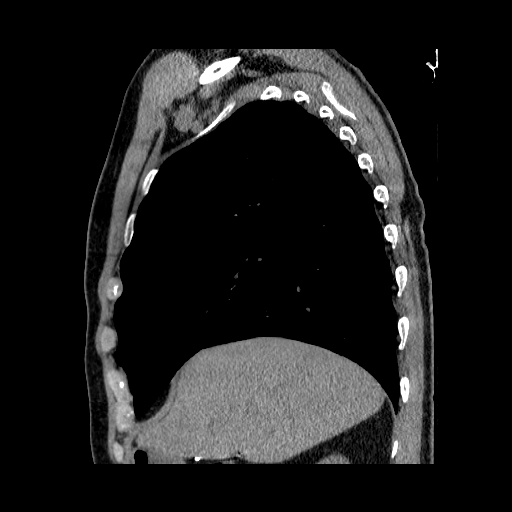
[im 63/157  mediastinal]
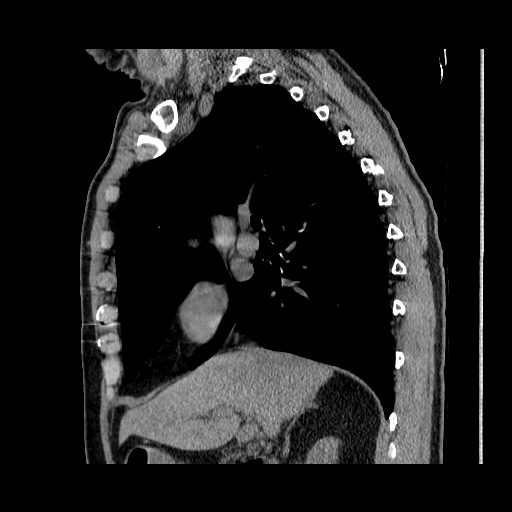

[17 of 30 positions shown; findings below may reference images not displayed]

FINDINGS: Cardiovascular: Coronary artery calcification and aortic
atherosclerotic calcification.

Mediastinum/Nodes: No axillary supraclavicular adenopathy. No
mediastinal hilar adenopathy. No pericardial fluid. Esophagus
normal.

Lungs/Pleura: 3 mm pulmonary nodule in the RIGHT lower lobe (image
63, series 4) is not imaged on comparison exam.

Angular nodule within the inferior RIGHT middle lobe measures 6 mm
(image 97, series 4) not changed from comparison exam. No sub solid
component identified.

There is linear parenchymal thickening in the RIGHT lower lobe and
LEFT lower lobe.

Upper Abdomen: Large hiatal hernia present. Postcholecystectomy.
Adrenal glands normal.

Musculoskeletal: No aggressive osseous lesion.
IMPRESSION: 1. RIGHT middle lobe nodule stable over 3 month interval. Recommend
single follow-up CT in 12 months from current month per Fleischner
criteria for solid nodule.
2. Large hiatal hernia.
3. Coronary artery calcification and aortic atherosclerotic
calcification.

## 2018-08-09 DIAGNOSIS — Z6826 Body mass index (BMI) 26.0-26.9, adult: Secondary | ICD-10-CM | POA: Diagnosis not present

## 2018-08-09 DIAGNOSIS — J309 Allergic rhinitis, unspecified: Secondary | ICD-10-CM | POA: Diagnosis not present

## 2018-08-09 DIAGNOSIS — R42 Dizziness and giddiness: Secondary | ICD-10-CM | POA: Diagnosis not present

## 2018-08-09 DIAGNOSIS — I10 Essential (primary) hypertension: Secondary | ICD-10-CM | POA: Diagnosis not present

## 2018-11-15 DIAGNOSIS — I10 Essential (primary) hypertension: Secondary | ICD-10-CM | POA: Diagnosis not present

## 2018-11-15 DIAGNOSIS — H35033 Hypertensive retinopathy, bilateral: Secondary | ICD-10-CM | POA: Diagnosis not present

## 2019-01-19 ENCOUNTER — Telehealth: Payer: Self-pay | Admitting: Cardiovascular Disease

## 2019-01-19 NOTE — Telephone Encounter (Signed)
Spoke with the pt and he reports that he has been feeling "swimmy" headed over the past week... he denies chest pain, sob, palpitations, edema...but has had a mild headache.. he denies cold or sinus symptoms but says that his ears are itching... when he lays down to sleep at night, he feels the room is moving when he closes his eyes.  Pt says he has been eating and drinking well. He has not had a chance to check his BP but will check with his wife when she returns home this evening because he says she may have a BP cuff... if not we can talk about other options to have it checked.Marland Kitchen  He will also call Dr. Brigitte Pulse his PCP to see if he can make an appt at his office for reasons that may not be cardiac after we find out what his BP is.  Advised him that we will call him in the morning to see how he is doing and if he was able to have his BP checked. He will try to hydrate well this evening and see if that helps improve his symptom.    Pt verbalized understanding and agreed.

## 2019-01-19 NOTE — Telephone Encounter (Signed)
New message   STAT if patient feels like he/she is going to faint   1) Are you dizzy now? Yes   2) Do you feel faint or have you passed out?no   3) Do you have any other symptoms?no   4) Have you checked your HR and BP (record if available)? No

## 2019-01-20 NOTE — Telephone Encounter (Signed)
I spoke to the patient who checked his BP this morning 131/81 and he is feeling fine, eating his oatmeal.  He is trying to make an appt with his PCP for other reasons.    He will monitor his BP and symptoms for the next few days and will call with updates.  He verbalized understanding and was thankful for the call.

## 2019-04-04 DIAGNOSIS — D1801 Hemangioma of skin and subcutaneous tissue: Secondary | ICD-10-CM | POA: Diagnosis not present

## 2019-04-04 DIAGNOSIS — C44319 Basal cell carcinoma of skin of other parts of face: Secondary | ICD-10-CM | POA: Diagnosis not present

## 2019-04-04 DIAGNOSIS — C44311 Basal cell carcinoma of skin of nose: Secondary | ICD-10-CM | POA: Diagnosis not present

## 2019-04-04 DIAGNOSIS — D2339 Other benign neoplasm of skin of other parts of face: Secondary | ICD-10-CM | POA: Diagnosis not present

## 2019-04-04 DIAGNOSIS — D485 Neoplasm of uncertain behavior of skin: Secondary | ICD-10-CM | POA: Diagnosis not present

## 2019-04-04 DIAGNOSIS — L814 Other melanin hyperpigmentation: Secondary | ICD-10-CM | POA: Diagnosis not present

## 2019-04-04 DIAGNOSIS — L821 Other seborrheic keratosis: Secondary | ICD-10-CM | POA: Diagnosis not present

## 2019-04-04 DIAGNOSIS — D225 Melanocytic nevi of trunk: Secondary | ICD-10-CM | POA: Diagnosis not present

## 2019-04-04 DIAGNOSIS — L820 Inflamed seborrheic keratosis: Secondary | ICD-10-CM | POA: Diagnosis not present

## 2019-04-04 DIAGNOSIS — L738 Other specified follicular disorders: Secondary | ICD-10-CM | POA: Diagnosis not present

## 2019-04-18 ENCOUNTER — Ambulatory Visit: Payer: Medicare Other | Admitting: Cardiovascular Disease

## 2019-04-21 DIAGNOSIS — N401 Enlarged prostate with lower urinary tract symptoms: Secondary | ICD-10-CM | POA: Diagnosis not present

## 2019-04-21 DIAGNOSIS — R3912 Poor urinary stream: Secondary | ICD-10-CM | POA: Diagnosis not present

## 2019-04-29 ENCOUNTER — Telehealth: Payer: Self-pay | Admitting: *Deleted

## 2019-04-29 NOTE — Telephone Encounter (Signed)
    COVID-19 Pre-Screening Questions:  . In the past 7 to 10 days have you had a cough,  shortness of breath, headache, congestion, fever (100 or greater) body aches, chills, sore throat, or sudden loss of taste or sense of smell? no . Have you been around anyone with known Covid 19. no . Have you been around anyone who is awaiting Covid 19 test results in the past 7 to 10 days? no . Have you been around anyone who has been exposed to Covid 19, or has mentioned symptoms of Covid 19 within the past 7 to 10 days? no  Pt has appt with Dr. Angelena Form on July 2,2020.  Screening questions answered by pt's wife.  She is aware pt should wear mask and arrive 15 minutes prior to appointment. Pt's wife has appointment with Dr. Angelena Form at 8:00 the same day so will come with him to appointment.  She is aware no one else may accompany them to appointment. Pt's wife aware to contact us if any changes in above questions.

## 2019-05-04 ENCOUNTER — Telehealth: Payer: Self-pay | Admitting: Cardiovascular Disease

## 2019-05-04 NOTE — Progress Notes (Signed)
Chief Complaint  Patient presents with  . Follow-up    CAD     History of Present Illness: 78 yo male with history of CAD s/p CABG in 2011, HTN, HLD and post-op atrial fibrillation here today for follow up.  He was admitted in April 2011 with a NSTEMI and was found to have severe disease in the LAD and Diagonal. He underwent 2V CABG with LIMA to LAD and SVG to Diagonal. Postop course was complicated by atrial fibrillation. Echo 2011 with LVEF 55-60%, mildly dilated RV. No evidence of atrial fib on post-op cardiac monitor in 2011.  Coumadin was discontinued. Stress myoview February 2016 with no ischemia. PVCs noted on EKG during stress. Admitted to [facility] December 2017 with chest pain. Cardiac cath 10/16/16 with stable CAD (occluded mid LAD and Diagonal with patent LIMA to LAD and patent SVG to Diagonal). The RCA had mild to moderate plaque. The Circumflex have no obstructive disease. PVCs noted on monitor during hospitalization. Echo December 2018 with normal LV systolic function, grade 2 diastolic dysfunction, no valve disease.   He is here today for follow up. The patient denies any chest pain, dyspnea, palpitations, lower extremity edema, orthopnea, PND, dizziness, near syncope or syncope.   Primary Care Physician: Marton Redwood, MD    Past Medical History:  Diagnosis Date  . Arthritis    "mild; fingers" (10/16/2016)  . CAD, ARTERY BYPASS GRAFT 03/07/2010   Qualifier: Diagnosis of  By: Orville Govern CMA, [place]    . Chest pain 10/16/2016  . Coronary artery disease    a. CABG in 2011 b. Cath 10/16/16 - chronic total occlusion of mid LAD with patietn LIMA to mid LAD; severe ostial stenosis to large diagonal with patent SVG to diagonal; moderate non obstrctive RCA, normal LV function. Tx Rx  . GERD 03/07/2010   Qualifier: Diagnosis of  By: Ronne Binning    . GERD (gastroesophageal reflux disease)   . Hx of cardiovascular stress test    a. ETT-MV 1/14: no ischemia; not gated  . Hyperlipidemia    . HYPERLIPIDEMIA 03/07/2010   Qualifier: Diagnosis of  By: Ronne Binning    . Hypertension   . HYPERTENSION 03/07/2010   Qualifier: Diagnosis of  By: Ronne Binning    . PAROXYSMAL ATRIAL FIBRILLATION 03/07/2010   Qualifier: Diagnosis of  By: Orville Govern CMA, [place]    . Paroxysmal atrial fibrillation (HCC)   . Pleural effusion   . PLEURAL EFFUSION, LEFT 03/07/2010   Qualifier: Diagnosis of  By: Orville Govern CMA, Carol    . Precordial pain   . Unstable angina ([place]) 10/16/2016  . Unstable angina pectoris ([place]) 10/16/2016    Past Surgical History:  Procedure Laterality Date  . CARDIAC CATHETERIZATION  ~ 2011; 10/16/2016  . CARDIAC CATHETERIZATION N/A 10/16/2016   Procedure: Left Heart Cath and Cors/Grafts Angiography;  Surgeon: Burnell Blanks, MD;  Location: [facility];  Service: Cardiovascular;  Laterality: N/A;  . CHOLECYSTECTOMY OPEN  ~ 1992  . COLONOSCOPY N/A 12/01/2013   Procedure: COLONOSCOPY;  Surgeon: Rogene Houston, MD;  [facility];  Service: Endoscopy;  Laterality: N/A;  830  . CORONARY ARTERY BYPASS GRAFT  02-13-10   2V (LIMA to LAD, SVG to diagonal)  . ESOPHAGOGASTRODUODENOSCOPY (EGD) WITH ESOPHAGEAL DILATION    . EXPLORATORY LAPAROTOMY  ~ 1995   for perforated duodenal ulcer  . HYDROCELE EXCISION      Current Outpatient Medications  Medication Sig Dispense Refill  .  aspirin EC 81 MG tablet Take 81 mg by mouth daily.    . calcium carbonate (OS-CAL) 600 MG TABS Take 600 mg by mouth 3 (three) times a week.     . esomeprazole (NEXIUM) 40 MG capsule Take 40 mg by mouth daily at 12 noon.    . metoprolol (LOPRESSOR) 25 MG tablet Take one tablet by mouth twice daily. 180 tablet 3  . nitroGLYCERIN (NITROSTAT) 0.4 MG SL tablet Place 1 tablet (0.4 mg total) under the tongue every 5 (five) minutes as needed for chest pain. 25 tablet 11  . rosuvastatin (CRESTOR) 20 MG tablet TAKE 1 TABLET DAILY 90 tablet 2  . tamsulosin (FLOMAX) 0.4 MG CAPS capsule Take 1 capsule  by mouth daily.     No current facility-administered medications for this visit.     Allergies  Allergen Reactions  . Oxytetracycline Other (See Comments)    Passes out.    Social History   Socioeconomic History  . Marital status: Married    Spouse name: Not on file  . Number of children: 2  . Years of education: Not on file  . Highest education level: Not on file  Occupational History  . Occupation: retirede    Fish farm manager: RETIRED  Social Needs  . Financial resource strain: Not on file  . Food insecurity    Worry: Not on file    Inability: Not on file  . Transportation needs    Medical: Not on file    Non-medical: Not on file  Tobacco Use  . Smoking status: Former Smoker    Packs/day: 0.25    Years: 1.00    Pack years: 0.25    Types: Cigarettes    Quit date: 1960    Years since quitting: 60.5  . Smokeless tobacco: Never Used  Substance and Sexual Activity  . Alcohol use: No  . Drug use: No  . Sexual activity: Yes  Lifestyle  . Physical activity    Days per week: Not on file    Minutes per session: Not on file  . Stress: Not on file  Relationships  . Social Herbalist on phone: Not on file    Gets together: Not on file    Attends religious service: Not on file    Active member of club or organization: Not on file    Attends meetings of clubs or organizations: Not on file    Relationship status: Not on file  . Intimate partner violence    Fear of current or ex partner: Not on file    Emotionally abused: Not on file    Physically abused: Not on file    Forced sexual activity: Not on file  Other Topics Concern  . Not on file  Social History Narrative  . Not on file    Family History  Problem Relation Age of Onset  . Other Mother        alive- healthy  . Hypertension Mother   . Stroke Father        deceased  . Hypertension Father   . Other Brother        CABG  . Heart attack Brother   . Hypertension Brother   . Colon cancer Neg Hx      Review of Systems:  As stated in the HPI and otherwise negative.   BP 114/60   Pulse (!) 58   Ht 6\' 1"  (1.854 m)   Wt 189 lb 12.8 oz (86.1 kg)  SpO2 97%   BMI 25.04 kg/m   Physical Examination:  General: Well developed, well nourished, NAD  HEENT: OP clear, mucus membranes moist  SKIN: warm, dry. No rashes. Neuro: No focal deficits  Musculoskeletal: Muscle strength 5/5 all ext  Psychiatric: Mood and affect normal  Neck: No JVD, no carotid bruits, no thyromegaly, no lymphadenopathy.  Lungs:Clear bilaterally, no wheezes, rhonci, crackles Cardiovascular: Regular rate and rhythm. No murmurs, gallops or rubs. Abdomen:Soft. Bowel sounds present. Non-tender.  Extremities: No lower extremity edema. Pulses are 2 + in the bilateral DP/PT.  Cardiac cath December 2017: Diagnostic Diagram        Echo 10/19/17: Left ventricle: The cavity size was normal. Wall thickness was   normal. Systolic function was normal. The estimated ejection   fraction was in the range of 60% to 65%. Wall motion was normal;   there were no regional wall motion abnormalities. Features are   consistent with a pseudonormal left ventricular filling pattern,   with concomitant abnormal relaxation and increased filling   pressure (grade 2 diastolic dysfunction).  EKG:  EKG is  ordered today. The ekg ordered today demonstrates sinus brady, rate 58 bpm  Recent Labs: No results found for requested labs within last 8760 hours.   Lipids: followed in primary care:   Wt Readings from Last 3 Encounters:  05/05/19 189 lb 12.8 oz (86.1 kg)  05/10/18 191 lb 6.4 oz (86.8 kg)  10/15/17 194 lb 9.6 oz (88.3 kg)     Other studies Reviewed: Additional studies/ records that were reviewed today include:. Review of the above records demonstrates:   Assessment and Plan:   1. Coronary Artery Disease without angina: He is s/p CABG in 2011. Cardiac cath in December 2017 with stable CAD with patent bypass grafts. Echo  December 2017 with normal LV systolic function. Will continue ASA, statin and beta blocker.   2. Hypertension: BP is well controlled. No changes today  3. Hyperlipidemia: Lipids managed in primary care. Continue statin.   4. Paroxysmal atrial fibrillation: He has had no known recurrences of atrial fibrillation since his post-op period in 2011. Continue beta blocker.   Current medicines are reviewed at length with the patient today.  The patient does not have concerns regarding medicines.  The following changes have been made:  no change  Labs/ tests ordered today include:   No orders of the defined types were placed in this encounter.   Disposition:   FU with me in 12 months  Signed, [name], MD 05/05/2019 8:16 AM    [facility] [address], [place], [place]  [address] Phone: [phone]; Fax: [phone]

## 2019-05-04 NOTE — Telephone Encounter (Signed)

## 2019-05-05 ENCOUNTER — Ambulatory Visit (INDEPENDENT_AMBULATORY_CARE_PROVIDER_SITE_OTHER): Payer: Medicare Other | Admitting: Cardiovascular Disease

## 2019-05-05 ENCOUNTER — Other Ambulatory Visit: Payer: Self-pay

## 2019-05-05 ENCOUNTER — Encounter: Payer: Self-pay | Admitting: Cardiovascular Disease

## 2019-05-05 VITALS — BP 114/60 | HR 58 | Ht 73.0 in | Wt 189.8 lb

## 2019-05-05 DIAGNOSIS — I10 Essential (primary) hypertension: Secondary | ICD-10-CM

## 2019-05-05 DIAGNOSIS — E7800 Pure hypercholesterolemia, unspecified: Secondary | ICD-10-CM | POA: Diagnosis not present

## 2019-05-05 DIAGNOSIS — I25810 Atherosclerosis of coronary artery bypass graft(s) without angina pectoris: Secondary | ICD-10-CM

## 2019-05-05 DIAGNOSIS — I480 Paroxysmal atrial fibrillation: Secondary | ICD-10-CM

## 2019-05-05 NOTE — Patient Instructions (Signed)
Medication Instructions:  Your provider recommends that you continue on your current medications as directed. Please refer to the Current Medication list given to you today.    Labwork: None  Testing/Procedures: None  Follow-Up: Your provider wants you to follow-up in: 1 year with Dr. McAlhany. You will receive a reminder letter in the mail two months in advance. If you don't receive a letter, please call our office to schedule the follow-up appointment.    Any Other Special Instructions Will Be Listed Below (If Applicable).     If you need a refill on your cardiac medications before your next appointment, please call your pharmacy.   

## 2019-06-14 DIAGNOSIS — E7849 Other hyperlipidemia: Secondary | ICD-10-CM | POA: Diagnosis not present

## 2019-06-14 DIAGNOSIS — E538 Deficiency of other specified B group vitamins: Secondary | ICD-10-CM | POA: Diagnosis not present

## 2019-06-14 DIAGNOSIS — M859 Disorder of bone density and structure, unspecified: Secondary | ICD-10-CM | POA: Diagnosis not present

## 2019-06-15 DIAGNOSIS — R82998 Other abnormal findings in urine: Secondary | ICD-10-CM | POA: Diagnosis not present

## 2019-06-15 DIAGNOSIS — I10 Essential (primary) hypertension: Secondary | ICD-10-CM | POA: Diagnosis not present

## 2019-06-21 DIAGNOSIS — R918 Other nonspecific abnormal finding of lung field: Secondary | ICD-10-CM | POA: Diagnosis not present

## 2019-06-21 DIAGNOSIS — I25810 Atherosclerosis of coronary artery bypass graft(s) without angina pectoris: Secondary | ICD-10-CM | POA: Diagnosis not present

## 2019-06-21 DIAGNOSIS — Z0000 Encounter for general adult medical examination without abnormal findings: Secondary | ICD-10-CM | POA: Diagnosis not present

## 2019-06-21 DIAGNOSIS — E538 Deficiency of other specified B group vitamins: Secondary | ICD-10-CM | POA: Diagnosis not present

## 2019-06-21 DIAGNOSIS — M8580 Other specified disorders of bone density and structure, unspecified site: Secondary | ICD-10-CM | POA: Diagnosis not present

## 2019-06-21 DIAGNOSIS — I252 Old myocardial infarction: Secondary | ICD-10-CM | POA: Diagnosis not present

## 2019-06-21 DIAGNOSIS — I10 Essential (primary) hypertension: Secondary | ICD-10-CM | POA: Diagnosis not present

## 2019-06-21 DIAGNOSIS — Z1331 Encounter for screening for depression: Secondary | ICD-10-CM | POA: Diagnosis not present

## 2019-06-21 DIAGNOSIS — I700 Atherosclerosis of aorta: Secondary | ICD-10-CM | POA: Diagnosis not present

## 2019-06-21 DIAGNOSIS — Z8679 Personal history of other diseases of the circulatory system: Secondary | ICD-10-CM | POA: Diagnosis not present

## 2019-06-21 DIAGNOSIS — R251 Tremor, unspecified: Secondary | ICD-10-CM | POA: Diagnosis not present

## 2019-06-21 DIAGNOSIS — E785 Hyperlipidemia, unspecified: Secondary | ICD-10-CM | POA: Diagnosis not present

## 2019-06-21 DIAGNOSIS — D692 Other nonthrombocytopenic purpura: Secondary | ICD-10-CM | POA: Diagnosis not present

## 2019-06-23 DIAGNOSIS — C44311 Basal cell carcinoma of skin of nose: Secondary | ICD-10-CM | POA: Diagnosis not present

## 2019-06-27 ENCOUNTER — Ambulatory Visit: Payer: Medicare Other | Admitting: Cardiovascular Disease

## 2019-07-13 ENCOUNTER — Telehealth (INDEPENDENT_AMBULATORY_CARE_PROVIDER_SITE_OTHER): Payer: Self-pay | Admitting: Internal Medicine

## 2019-07-13 NOTE — Telephone Encounter (Signed)
Wife called stated patient is having stomach issues again and wants Dr Laural Golden to call them ph# [phone]

## 2019-07-13 NOTE — Telephone Encounter (Signed)
Dr.Rehman has not seen the patient since January 2015. Patient needs to be given OV.

## 2019-07-25 DIAGNOSIS — M8589 Other specified disorders of bone density and structure, multiple sites: Secondary | ICD-10-CM | POA: Diagnosis not present

## 2019-07-28 ENCOUNTER — Ambulatory Visit (INDEPENDENT_AMBULATORY_CARE_PROVIDER_SITE_OTHER): Payer: Medicare Other | Admitting: Nurse Practitioner

## 2019-07-28 ENCOUNTER — Other Ambulatory Visit: Payer: Self-pay

## 2019-07-28 ENCOUNTER — Telehealth (INDEPENDENT_AMBULATORY_CARE_PROVIDER_SITE_OTHER): Payer: Self-pay | Admitting: *Deleted

## 2019-07-28 ENCOUNTER — Encounter (INDEPENDENT_AMBULATORY_CARE_PROVIDER_SITE_OTHER): Payer: Self-pay | Admitting: Nurse Practitioner

## 2019-07-28 ENCOUNTER — Encounter (INDEPENDENT_AMBULATORY_CARE_PROVIDER_SITE_OTHER): Payer: Self-pay | Admitting: *Deleted

## 2019-07-28 VITALS — BP 130/82 | HR 60 | Temp 98.1°F | Ht 73.0 in | Wt 194.6 lb

## 2019-07-28 DIAGNOSIS — Z860100 Personal history of colon polyps, unspecified: Secondary | ICD-10-CM | POA: Insufficient documentation

## 2019-07-28 DIAGNOSIS — R1013 Epigastric pain: Secondary | ICD-10-CM | POA: Diagnosis not present

## 2019-07-28 MED ORDER — PEG 3350-KCL-NA BICARB-NACL 420 G PO SOLR: 4000.0000 mL | Freq: Once | ORAL | 0 refills | Status: AC

## 2019-07-28 NOTE — Telephone Encounter (Signed)
Patient need trilyte TCS/EGD sch'd 11/13

## 2019-07-28 NOTE — Progress Notes (Signed)
Subjective:    Patient ID: Ruben Burton, male    DOB: 1940/11/30, 78 y.o.   MRN: [id number]  HPI Ruben Burton is a 78 year old male with a past medical history significant of coronary artery disease, status post two-vessel CABG in 2011, he briefly had atrial fibrillation which resolved in 2011, perforated duodenal ulcer in 1995 which required surgical intervention, arthritis and colon polyps. He complains of having epigastric pain which started 2 weeks ago which is worse after eating. First episode occurred after eating fried fish. He describes the pain as a burning pain, feels nervous when he has this pain. No chest pain or SOB. No nausea or vomiting. No lower abdominal pain. He is passing a normal brown formed stool once daily. No rectal bleeding or black stools. No weight loss. No fever, sweats or chills. He is on ASA 81 once daily. No other NSAIDs. He takes Tylenol as needed. He takes Nexium 40 mg once daily since 1993.  His most recent colonoscopy was January 2015 which was normal, he was recommended to repeat a colonoscopy in 7 years.  He had 2 tubular adenomas removed in 2004.  No family history of colon cancer. No alcohol use. Past smoker.  Colonoscopy   12/01/2013: Prep excellent. Normal mucosa of colon and rectum. Small hemorrhoids below the dentate line and single anal papilla. Recall colonoscopy in 7 year.  EGD 01/30/2003: 1. 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   2. Moderate sized sliding hiatal hernia.  3. Esophagus dilated by passing 56 [place] Maloney dilator.  4. Two small gastric polyps ablated by cold biopsy.  Past Medical History:  Diagnosis Date   Arthritis    "mild; fingers" (10/16/2016)   CAD, ARTERY BYPASS GRAFT 03/07/2010   Qualifier: Diagnosis of  By: Orville Govern CMA, [place]     Chest pain 10/16/2016   Coronary artery disease    a. CABG in 2011 b. Cath 10/16/16 - chronic total occlusion of mid LAD with patietn LIMA to  mid LAD; severe ostial stenosis to large diagonal with patent SVG to diagonal; moderate non obstrctive RCA, normal LV function. Tx Rx   GERD 03/07/2010   Qualifier: Diagnosis of  By: Orville Govern, CMA, Carol     GERD (gastroesophageal reflux disease)    Hx of cardiovascular stress test    a. ETT-MV 1/14: no ischemia; not gated   Hyperlipidemia    HYPERLIPIDEMIA 03/07/2010   Qualifier: Diagnosis of  By: Orville Govern CMA, Carol     Hypertension    HYPERTENSION 03/07/2010   Qualifier: Diagnosis of  By: Orville Govern CMA, [place]     PAROXYSMAL ATRIAL FIBRILLATION 03/07/2010   Qualifier: Diagnosis of  By: Orville Govern CMA, Carol     Paroxysmal atrial fibrillation ([place])    Pleural effusion    PLEURAL EFFUSION, LEFT 03/07/2010   Qualifier: Diagnosis of  By: Orville Govern, CMA, Carol     Precordial pain    Unstable angina ([place]) 10/16/2016   Unstable angina pectoris ([place]) 10/16/2016   Past Surgical History:  Procedure Laterality Date   CARDIAC CATHETERIZATION  ~ 2011; 10/16/2016   CARDIAC CATHETERIZATION N/A 10/16/2016   Procedure: Left Heart Cath and Cors/Grafts Angiography;  Surgeon: Burnell Blanks, MD;  Location: [facility];  Service: Cardiovascular;  Laterality: N/A;   CHOLECYSTECTOMY OPEN  ~ 1992   COLONOSCOPY N/A 12/01/2013   Procedure: COLONOSCOPY;  Surgeon: Rogene Houston, MD;  [facility];  Service: Endoscopy;  Laterality:  N/A;  830   CORONARY ARTERY BYPASS GRAFT  02-13-10   2V (LIMA to LAD, SVG to diagonal)   ESOPHAGOGASTRODUODENOSCOPY (EGD) WITH ESOPHAGEAL DILATION     EXPLORATORY LAPAROTOMY  ~ 1995   for perforated duodenal ulcer   HYDROCELE EXCISION     Current Outpatient Medications on File Prior to Visit  Medication Sig Dispense Refill   aspirin EC 81 MG tablet Take 81 mg by mouth daily.     calcium carbonate (OS-CAL) 1250 (500 Ca) MG chewable tablet Chew 1 tablet by mouth daily.     esomeprazole (NEXIUM) 40 MG capsule Take 40 mg by mouth daily at 12 noon.      metoprolol (LOPRESSOR) 25 MG tablet Take one tablet by mouth twice daily. 180 tablet 3   nitroGLYCERIN (NITROSTAT) 0.4 MG SL tablet Place 1 tablet (0.4 mg total) under the tongue every 5 (five) minutes as needed for chest pain. 25 tablet 11   rosuvastatin (CRESTOR) 20 MG tablet TAKE 1 TABLET DAILY 90 tablet 2   tamsulosin (FLOMAX) 0.4 MG CAPS capsule Take 1 capsule by mouth daily.     No current facility-administered medications on file prior to visit.    Allergies  Allergen Reactions   Omeprazole Nausea Only   Pantoprazole Nausea Only   Oxytetracycline Other (See Comments)    Passes out.   Family History  Problem Relation Age of Onset   Other Mother        alive- healthy   Hypertension Mother    Stroke Father        deceased   Hypertension Father    Other Brother        CABG   Heart attack Brother    Hypertension Brother    Colon cancer Neg Hx    Social History   Socioeconomic History   Marital status: Married    Spouse name: Not on file   Number of children: 2   Years of education: Not on file   Highest education level: Not on file  Occupational History   Occupation: retirede    Fish farm manager: RETIRED  Social Designer, fashion/clothing strain: Not on file   Food insecurity    Worry: Not on file    Inability: Not on file   Transportation needs    Medical: Not on file    Non-medical: Not on file  Tobacco Use   Smoking status: Former Smoker    Packs/day: 0.25    Years: 1.00    Pack years: 0.25    Types: Cigarettes    Quit date: 1960    Years since quitting: 60.7   Smokeless tobacco: Never Used  Substance and Sexual Activity   Alcohol use: No   Drug use: No   Sexual activity: Yes  Lifestyle   Physical activity    Days per week: Not on file    Minutes per session: Not on file   Stress: Not on file  Relationships   Social connections    Talks on phone: Not on file    Gets together: Not on file    Attends religious service: Not  on file    Active member of club or organization: Not on file    Attends meetings of clubs or organizations: Not on file    Relationship status: Not on file   Intimate partner violence    Fear of current or ex partner: Not on file    Emotionally abused: Not on file  Physically abused: Not on file    Forced sexual activity: Not on file  Other Topics Concern   Not on file  Social History Narrative   Not on file   Review of Systems see HPI, all other systems reviewed and are negative     Objective:   Physical Exam  BP 130/82    Pulse 60    Temp 98.1 F (36.7 C) (Oral)    Ht 6\' 1"  (1.854 m)    Wt 194 lb 9.6 oz (88.3 kg)    BMI 25.67 kg/m  General: 78 year old male well-developed in no acute distress Eyes: Sclera nonicteric, conjunctiva pink Mouth: Dentition intact, partial upper plate Neck: Supple, no lymphadenopathy or thyromegaly Heart: Regular rate and rhythm, no murmurs Chest: Mediastinal scar intact Lungs: Breath sounds clear throughout Abdomen: Soft, nontender, no masses or organomegaly, midline abdominal scar and right upper quadrant scar intact, positive bowel sounds to all 4 quadrants Extremities: No edema Neuro: Alert and oriented x4, no focal deficits    Assessment & Plan:   8.  78 year old male with a history of a perforated duodenal ulcer in the 1990s, GERD presents with epigastric pain  -Increase as omeprazole 40 mg to twice daily -Schedule an EGD, benefits and risk discussed -Patient to call office if his abdominal pain worsens -Avoid spicy and fried food -CBC, CMP, CRP and lipase level  2.  History of colon polyps -Due for a colonoscopy January/2022.  Advised patient to combine EGD and colonoscopy later this fall.  3.  History of coronary artery disease, stable  Further follow-up to be determined after EGD and colonoscopy completed

## 2019-07-28 NOTE — Patient Instructions (Signed)
1. Increase your Esomeprazole 40mg  one capsule twice daily   2. Schedule an EGD and colonoscopy   3. Complete the provided lab order   4. Call our office if your abdominal pain worsens  5. Avoid spicy or fried foods

## 2019-07-29 DIAGNOSIS — R1013 Epigastric pain: Secondary | ICD-10-CM | POA: Diagnosis not present

## 2019-07-30 LAB — CBC WITH DIFFERENTIAL/PLATELET
Absolute Monocytes: 470 cells/uL (ref 200–950)
Basophils Absolute: 17 cells/uL (ref 0–200)
Basophils Relative: 0.3 %
Eosinophils Absolute: 429 cells/uL (ref 15–500)
Eosinophils Relative: 7.4 %
HCT: 47.6 % (ref 38.5–50.0)
Hemoglobin: 16.5 g/dL (ref 13.2–17.1)
Lymphs Abs: 1769 cells/uL (ref 850–3900)
MCH: 32.7 pg (ref 27.0–33.0)
MCHC: 34.7 g/dL (ref 32.0–36.0)
MCV: 94.4 fL (ref 80.0–100.0)
MPV: 13.2 fL — ABNORMAL HIGH (ref 7.5–12.5)
Monocytes Relative: 8.1 %
Neutro Abs: 3115 cells/uL (ref 1500–7800)
Neutrophils Relative %: 53.7 %
Platelets: 148 10*3/uL (ref 140–400)
RBC: 5.04 10*6/uL (ref 4.20–5.80)
RDW: 12.2 % (ref 11.0–15.0)
Total Lymphocyte: 30.5 %
WBC: 5.8 10*3/uL (ref 3.8–10.8)

## 2019-07-30 LAB — LIPASE: Lipase: 13 U/L (ref 7–60)

## 2019-07-30 LAB — COMPLETE METABOLIC PANEL WITH GFR
AG Ratio: 1.8 (calc) (ref 1.0–2.5)
ALT: 21 U/L (ref 9–46)
AST: 20 U/L (ref 10–35)
Albumin: 4 g/dL (ref 3.6–5.1)
Alkaline phosphatase (APISO): 57 U/L (ref 35–144)
BUN: 11 mg/dL (ref 7–25)
CO2: 30 mmol/L (ref 20–32)
Calcium: 8.8 mg/dL (ref 8.6–10.3)
Chloride: 107 mmol/L (ref 98–110)
Creat: 1.04 mg/dL (ref 0.70–1.18)
GFR, Est African American: 79 mL/min/{1.73_m2} (ref >=60)
GFR, Est Non African American: 68 mL/min/{1.73_m2} (ref >=60)
Globulin: 2.2 g/dL (calc) (ref 1.9–3.7)
Glucose, Bld: 101 mg/dL — ABNORMAL HIGH (ref 65–99)
Potassium: 4.2 mmol/L (ref 3.5–5.3)
Sodium: 142 mmol/L (ref 135–146)
Total Bilirubin: 2 mg/dL — ABNORMAL HIGH (ref 0.2–1.2)
Total Protein: 6.2 g/dL (ref 6.1–8.1)

## 2019-07-30 LAB — C-REACTIVE PROTEIN: CRP: 0.4 mg/L (ref <8.0)

## 2019-08-03 ENCOUNTER — Telehealth (INDEPENDENT_AMBULATORY_CARE_PROVIDER_SITE_OTHER): Payer: Self-pay | Admitting: *Deleted

## 2019-08-03 DIAGNOSIS — Z86010 Personal history of colonic polyps: Secondary | ICD-10-CM

## 2019-08-03 MED ORDER — PEG 3350-KCL-NA BICARB-NACL 420 G PO SOLR: 4000.0000 mL | Freq: Once | ORAL | 0 refills | Status: AC

## 2019-08-03 NOTE — Telephone Encounter (Signed)
Patient needs trilyte 

## 2019-08-31 DIAGNOSIS — H6123 Impacted cerumen, bilateral: Secondary | ICD-10-CM | POA: Diagnosis not present

## 2019-08-31 DIAGNOSIS — H903 Sensorineural hearing loss, bilateral: Secondary | ICD-10-CM | POA: Diagnosis not present

## 2019-08-31 DIAGNOSIS — H838X3 Other specified diseases of inner ear, bilateral: Secondary | ICD-10-CM | POA: Diagnosis not present

## 2019-08-31 DIAGNOSIS — D2321 Other benign neoplasm of skin of right ear and external auricular canal: Secondary | ICD-10-CM | POA: Diagnosis not present

## 2019-09-14 ENCOUNTER — Encounter (HOSPITAL_COMMUNITY)
Admission: RE | Admit: 2019-09-14 | Discharge: 2019-09-14 | Disposition: A | Discharge status: Home / Self Care | Payer: Medicare Other | Source: Ambulatory Visit | Attending: Internal Medicine | Admitting: Internal Medicine

## 2019-09-14 ENCOUNTER — Other Ambulatory Visit (HOSPITAL_COMMUNITY)
Admission: RE | Admit: 2019-09-14 | Discharge: 2019-09-14 | Disposition: A | Discharge status: Home / Self Care | Payer: Medicare Other | Source: Ambulatory Visit | Attending: Internal Medicine | Admitting: Internal Medicine

## 2019-09-14 ENCOUNTER — Other Ambulatory Visit: Payer: Self-pay

## 2019-09-14 DIAGNOSIS — Z01812 Encounter for preprocedural laboratory examination: Secondary | ICD-10-CM | POA: Diagnosis not present

## 2019-09-14 DIAGNOSIS — Z20828 Contact with and (suspected) exposure to other viral communicable diseases: Secondary | ICD-10-CM | POA: Diagnosis not present

## 2019-09-14 LAB — SARS CORONAVIRUS 2 (TAT 6-24 HRS): SARS Coronavirus 2: NEGATIVE

## 2019-09-16 ENCOUNTER — Encounter (HOSPITAL_COMMUNITY)
Admission: RE | Disposition: A | Discharge status: Home / Self Care | Payer: Self-pay | Source: Home / Self Care | Attending: Internal Medicine

## 2019-09-16 ENCOUNTER — Ambulatory Visit (HOSPITAL_COMMUNITY): Payer: Medicare Other | Admitting: Certified Registered Nurse Anesthetist

## 2019-09-16 ENCOUNTER — Encounter (HOSPITAL_COMMUNITY): Payer: Self-pay | Admitting: *Deleted

## 2019-09-16 ENCOUNTER — Ambulatory Visit (HOSPITAL_COMMUNITY)
Admission: RE | Admit: 2019-09-16 | Discharge: 2019-09-16 | Disposition: A | Discharge status: Home / Self Care | Payer: Medicare Other | Attending: Internal Medicine | Admitting: Internal Medicine

## 2019-09-16 DIAGNOSIS — K635 Polyp of colon: Secondary | ICD-10-CM | POA: Diagnosis not present

## 2019-09-16 DIAGNOSIS — K2950 Unspecified chronic gastritis without bleeding: Secondary | ICD-10-CM | POA: Diagnosis not present

## 2019-09-16 DIAGNOSIS — Z951 Presence of aortocoronary bypass graft: Secondary | ICD-10-CM | POA: Diagnosis not present

## 2019-09-16 DIAGNOSIS — I2510 Atherosclerotic heart disease of native coronary artery without angina pectoris: Secondary | ICD-10-CM | POA: Diagnosis not present

## 2019-09-16 DIAGNOSIS — I480 Paroxysmal atrial fibrillation: Secondary | ICD-10-CM | POA: Diagnosis not present

## 2019-09-16 DIAGNOSIS — M19049 Primary osteoarthritis, unspecified hand: Secondary | ICD-10-CM | POA: Insufficient documentation

## 2019-09-16 DIAGNOSIS — Z8249 Family history of ischemic heart disease and other diseases of the circulatory system: Secondary | ICD-10-CM | POA: Diagnosis not present

## 2019-09-16 DIAGNOSIS — E785 Hyperlipidemia, unspecified: Secondary | ICD-10-CM | POA: Insufficient documentation

## 2019-09-16 DIAGNOSIS — I10 Essential (primary) hypertension: Secondary | ICD-10-CM | POA: Insufficient documentation

## 2019-09-16 DIAGNOSIS — Z9049 Acquired absence of other specified parts of digestive tract: Secondary | ICD-10-CM | POA: Insufficient documentation

## 2019-09-16 DIAGNOSIS — Z7951 Long term (current) use of inhaled steroids: Secondary | ICD-10-CM | POA: Diagnosis not present

## 2019-09-16 DIAGNOSIS — K31819 Angiodysplasia of stomach and duodenum without bleeding: Secondary | ICD-10-CM | POA: Diagnosis not present

## 2019-09-16 DIAGNOSIS — Z87891 Personal history of nicotine dependence: Secondary | ICD-10-CM | POA: Diagnosis not present

## 2019-09-16 DIAGNOSIS — D123 Benign neoplasm of transverse colon: Secondary | ICD-10-CM | POA: Diagnosis not present

## 2019-09-16 DIAGNOSIS — K219 Gastro-esophageal reflux disease without esophagitis: Secondary | ICD-10-CM | POA: Diagnosis not present

## 2019-09-16 DIAGNOSIS — K2970 Gastritis, unspecified, without bleeding: Secondary | ICD-10-CM | POA: Diagnosis not present

## 2019-09-16 DIAGNOSIS — D120 Benign neoplasm of cecum: Secondary | ICD-10-CM | POA: Diagnosis not present

## 2019-09-16 DIAGNOSIS — Z86010 Personal history of colonic polyps: Secondary | ICD-10-CM | POA: Diagnosis not present

## 2019-09-16 DIAGNOSIS — K5730 Diverticulosis of large intestine without perforation or abscess without bleeding: Secondary | ICD-10-CM | POA: Insufficient documentation

## 2019-09-16 DIAGNOSIS — Z7982 Long term (current) use of aspirin: Secondary | ICD-10-CM | POA: Diagnosis not present

## 2019-09-16 DIAGNOSIS — Z823 Family history of stroke: Secondary | ICD-10-CM | POA: Insufficient documentation

## 2019-09-16 DIAGNOSIS — Z1211 Encounter for screening for malignant neoplasm of colon: Secondary | ICD-10-CM | POA: Diagnosis not present

## 2019-09-16 DIAGNOSIS — K449 Diaphragmatic hernia without obstruction or gangrene: Secondary | ICD-10-CM | POA: Diagnosis not present

## 2019-09-16 DIAGNOSIS — Z79899 Other long term (current) drug therapy: Secondary | ICD-10-CM | POA: Diagnosis not present

## 2019-09-16 DIAGNOSIS — R1013 Epigastric pain: Secondary | ICD-10-CM | POA: Insufficient documentation

## 2019-09-16 DIAGNOSIS — Z8711 Personal history of peptic ulcer disease: Secondary | ICD-10-CM | POA: Insufficient documentation

## 2019-09-16 DIAGNOSIS — Z09 Encounter for follow-up examination after completed treatment for conditions other than malignant neoplasm: Secondary | ICD-10-CM | POA: Diagnosis not present

## 2019-09-16 DIAGNOSIS — Z881 Allergy status to other antibiotic agents status: Secondary | ICD-10-CM | POA: Insufficient documentation

## 2019-09-16 DIAGNOSIS — Z888 Allergy status to other drugs, medicaments and biological substances status: Secondary | ICD-10-CM | POA: Insufficient documentation

## 2019-09-16 HISTORY — PX: BIOPSY: SHX5522

## 2019-09-16 HISTORY — PX: COLONOSCOPY WITH PROPOFOL: SHX5780

## 2019-09-16 HISTORY — PX: POLYPECTOMY: SHX5525

## 2019-09-16 HISTORY — PX: ESOPHAGOGASTRODUODENOSCOPY (EGD) WITH PROPOFOL: SHX5813

## 2019-09-16 SURGERY — ESOPHAGOGASTRODUODENOSCOPY (EGD) WITH PROPOFOL
Anesthesia: General

## 2019-09-16 MED ORDER — PROPOFOL 10 MG/ML IV BOLUS
INTRAVENOUS | Status: AC
  Filled 2019-09-16: qty 20

## 2019-09-16 MED ORDER — PROPOFOL 10 MG/ML IV BOLUS
INTRAVENOUS | Status: DC | PRN
  Administered 2019-09-16: 30 mg via INTRAVENOUS

## 2019-09-16 MED ORDER — LIDOCAINE 2% (20 MG/ML) 5 ML SYRINGE
INTRAMUSCULAR | Status: AC
  Filled 2019-09-16: qty 5

## 2019-09-16 MED ORDER — GLYCOPYRROLATE 0.2 MG/ML IJ SOLN
INTRAMUSCULAR | Status: DC | PRN
  Administered 2019-09-16: 0.1 mg via INTRAVENOUS

## 2019-09-16 MED ORDER — CHLORHEXIDINE GLUCONATE CLOTH 2 % EX PADS: 6.0000 | MEDICATED_PAD | Freq: Once | CUTANEOUS | Status: DC

## 2019-09-16 MED ORDER — GLYCOPYRROLATE PF 0.2 MG/ML IJ SOSY
PREFILLED_SYRINGE | INTRAMUSCULAR | Status: AC
  Filled 2019-09-16: qty 1

## 2019-09-16 MED ORDER — PROPOFOL 500 MG/50ML IV EMUL
INTRAVENOUS | Status: DC | PRN
  Administered 2019-09-16: 125 ug/kg/min via INTRAVENOUS

## 2019-09-16 MED ORDER — LIDOCAINE HCL (CARDIAC) PF 100 MG/5ML IV SOSY
PREFILLED_SYRINGE | INTRAVENOUS | Status: DC | PRN
  Administered 2019-09-16: 50 mg via INTRATRACHEAL

## 2019-09-16 MED ORDER — LACTATED RINGERS IV SOLN
INTRAVENOUS | Status: DC | PRN
  Administered 2019-09-16: 10:00:00 via INTRAVENOUS

## 2019-09-16 MED ORDER — KETAMINE HCL 10 MG/ML IJ SOLN
INTRAMUSCULAR | Status: DC | PRN
  Administered 2019-09-16: 30 mg via INTRAVENOUS

## 2019-09-16 MED ORDER — ESOMEPRAZOLE MAGNESIUM 40 MG PO CPDR: 40.0000 mg | DELAYED_RELEASE_CAPSULE | Freq: Two times a day (BID) | ORAL | 5 refills | Status: DC

## 2019-09-16 MED ORDER — KETAMINE HCL 50 MG/5ML IJ SOSY
PREFILLED_SYRINGE | INTRAMUSCULAR | Status: AC
  Filled 2019-09-16: qty 5

## 2019-09-16 NOTE — H&P (Addendum)
Ruben Burton is an 78 y.o. male.   Chief Complaint: Patient is here for esophagogastroduodenoscopy and colonoscopy. HPI: Patient is 78 year old Caucasian male who has remote history of perforated duodenal ulcer over 30 years ago requiring surgery who presented office with epigastric pain without nausea vomiting melena or rectal bleeding.  Patient is on low-dose aspirin.  PPI dose was doubled and EGD was scheduled.  He says he is doing much better.  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  It was not fractionated. He also has history of colonic polyps and is undergoing surveillance colonoscopy.  He denies rectal bleeding or change in his bowel habits. Family history is negative for CRC. Last aspirin dose was 3 days ago.  Past Medical History:  Diagnosis Date  . Arthritis    "mild; fingers" (10/16/2016)  . CAD, ARTERY BYPASS GRAFT 03/07/2010   Qualifier: Diagnosis of  By: Orville Govern CMA, Carol    . Chest pain 10/16/2016  . Coronary artery disease    a. CABG in 2011 b. Cath 10/16/16 - chronic total occlusion of mid LAD with patietn LIMA to mid LAD; severe ostial stenosis to large diagonal with patent SVG to diagonal; moderate non obstrctive RCA, normal LV function. Tx Rx  . GERD 03/07/2010   Qualifier: Diagnosis of  By: Ronne Binning    . GERD (gastroesophageal reflux disease)   . Hx of cardiovascular stress test    a. ETT-MV 1/14: no ischemia; not gated  . Hyperlipidemia   . HYPERLIPIDEMIA 03/07/2010   Qualifier: Diagnosis of  By: Ronne Binning    . Hypertension   . HYPERTENSION 03/07/2010   Qualifier: Diagnosis of  By: Ronne Binning    . PAROXYSMAL ATRIAL FIBRILLATION 03/07/2010   Qualifier: Diagnosis of  By: Orville Govern CMA, Carol    . Paroxysmal atrial fibrillation (HCC)   . Pleural effusion   . PLEURAL EFFUSION, LEFT 03/07/2010   Qualifier: Diagnosis of  By: Orville Govern CMA, Carol    . Precordial pain   . Unstable angina  ([place]) 10/16/2016  . Unstable angina pectoris ([place]) 10/16/2016    Past Surgical History:  Procedure Laterality Date  . CARDIAC CATHETERIZATION  ~ 2011; 10/16/2016  . CARDIAC CATHETERIZATION N/A 10/16/2016   Procedure: Left Heart Cath and Cors/Grafts Angiography;  Surgeon: Burnell Blanks, MD;  Location: [facility];  Service: Cardiovascular;  Laterality: N/A;  . CHOLECYSTECTOMY OPEN  ~ 1992  . COLONOSCOPY N/A 12/01/2013   Procedure: COLONOSCOPY;  Surgeon: Rogene Houston, MD;  Location: [facility];  Service: Endoscopy;  Laterality: N/A;  830  . CORONARY ARTERY BYPASS GRAFT  02-13-10   2V (LIMA to LAD, SVG to diagonal)  . ESOPHAGOGASTRODUODENOSCOPY (EGD) WITH ESOPHAGEAL DILATION    . EXPLORATORY LAPAROTOMY  ~ 1995   for perforated duodenal ulcer  . HYDROCELE EXCISION      Family History  Problem Relation Age of Onset  . Other Mother        alive- healthy  . Hypertension Mother   . Stroke Father        deceased  . Hypertension Father   . Other Brother        CABG  . Heart attack Brother   . Hypertension Brother   . Colon cancer Neg Hx    Social History:  reports that he quit smoking about 60 years ago. His smoking use included cigarettes. He has a 0.25  pack-year smoking history. He has never used smokeless tobacco. He reports that he does not drink alcohol or use drugs.  Allergies:  Allergies  Allergen Reactions  . Omeprazole Nausea Only  . Pantoprazole Nausea Only  . Oxytetracycline Other (See Comments)    Passes out.    Medications Prior to Admission  Medication Sig Dispense Refill  . aspirin EC 81 MG tablet Take 81 mg by mouth every evening.     . B Complex-C (B-COMPLEX WITH VITAMIN C) tablet Take 1 tablet by mouth daily after breakfast.    . calcium carbonate (OS-CAL) 1250 (500 Ca) MG chewable tablet Chew 1 tablet by mouth 2 (two) times daily.     Marland Kitchen esomeprazole (NEXIUM) 40 MG capsule Take 40 mg by mouth daily before breakfast.     . metoprolol  (LOPRESSOR) 25 MG tablet Take one tablet by mouth twice daily. (Patient taking differently: Take 25 mg by mouth 2 (two) times daily. ) 180 tablet 3  . mometasone (ELOCON) 0.1 % cream Apply 1 application topically daily as needed (ear itching.).     Marland Kitchen nitroGLYCERIN (NITROSTAT) 0.4 MG SL tablet Place 1 tablet (0.4 mg total) under the tongue every 5 (five) minutes as needed for chest pain. (Patient taking differently: Place 0.4 mg under the tongue every 5 (five) minutes x 3 doses as needed for chest pain. ) 25 tablet 11  . polyethylene glycol-electrolytes (NULYTELY/GOLYTELY) 420 g solution Take 4,000 mLs by mouth once.    . rosuvastatin (CRESTOR) 20 MG tablet TAKE 1 TABLET DAILY (Patient taking differently: Take 20 mg by mouth every evening. ) 90 tablet 2  . tamsulosin (FLOMAX) 0.4 MG CAPS capsule Take 0.4 mg by mouth daily.       No results found for this or any previous visit (from the past 48 hour(s)). No results found.  ROS  Blood pressure 132/78, pulse 68, temperature 98 F (36.7 C), temperature source Oral, resp. rate 18, SpO2 100 %. Physical Exam  Constitutional: He appears well-developed and well-nourished.  HENT:  Mouth/Throat: Oropharynx is clear and moist.  Eyes: Conjunctivae are normal. No scleral icterus.  Neck: No thyromegaly present.  Cardiovascular: Normal rate, regular rhythm and normal heart sounds.  No murmur heard. Respiratory: Effort normal and breath sounds normal.  Midsternal scar  GI:  Abdomen is symmetrical with upper midline scar.  On palpation is soft and nontender with organomegaly or masses.  Musculoskeletal:        General: No edema.  Neurological: He is alert.  Skin: Skin is warm and dry.     Assessment/Plan Epigastric pain in a patient with remote history of perforated duodenal ulcer. Patient is on low-dose aspirin. History of colonic polyps. Diagnostic esophagogastroduodenoscopy and surveillance colonoscopy.  Hildred Laser, MD 09/16/2019, 9:24  AM

## 2019-09-16 NOTE — Discharge Instructions (Signed)
Resume aspirin on 09/17/2019 Resume other medications as before.  Please note prescription for esomeprazole 40 mg twice daily sent to [facility] in [place]. Resume usual diet. Antireflux measures sheet to be provided to the patient No driving for 24 hours. Physician will call with biopsy results.     Upper Endoscopy, Adult, Care After This sheet gives you information about how to care for yourself after your procedure. Your health care provider may also give you more specific instructions. If you have problems or questions, contact your health care provider. What can I expect after the procedure? After the procedure, it is common to have:  A sore throat.  Mild stomach pain or discomfort.  Bloating.  Nausea. Follow these instructions at home:   Follow instructions from your health care provider about what to eat or drink after your procedure.  Return to your normal activities as told by your health care provider. Ask your health care provider what activities are safe for you.  Take over-the-counter and prescription medicines only as told by your health care provider.  Do not drive for 24 hours if you were given a sedative during your procedure.  Keep all follow-up visits as told by your health care provider. This is important. Contact a health care provider if you have:  A sore throat that lasts longer than one day.  Trouble swallowing. Get help right away if:  You vomit blood or your vomit looks like coffee grounds.  You have: ? A fever. ? Bloody, black, or tarry stools. ? A severe sore throat or you cannot swallow. ? Difficulty breathing. ? Severe pain in your chest or abdomen. Summary  After the procedure, it is common to have a sore throat, mild stomach discomfort, bloating, and nausea.  Do not drive for 24 hours if you were given a sedative during the procedure.  Follow instructions from your health care provider about what to eat or drink after your  procedure.  Return to your normal activities as told by your health care provider. This information is not intended to replace advice given to you by your health care provider. Make sure you discuss any questions you have with your health care provider. Document Released: 04/20/2012 Document Revised: 04/13/2018 Document Reviewed: 03/22/2018 Elsevier Patient Education  2020 [employer].     Colonoscopy, Adult, Care After This sheet gives you information about how to care for yourself after your procedure. Your doctor may also give you more specific instructions. If you have problems or questions, call your doctor. What can I expect after the procedure? After the procedure, it is common to have:  A small amount of blood in your poop for 24 hours.  Some gas.  Mild cramping or bloating in your belly. Follow these instructions at home: General instructions  For the first 24 hours after the procedure: ? Do not drive or use machinery. ? Do not sign important documents. ? Do not drink alcohol. ? Do your daily activities more slowly than normal. ? Eat foods that are soft and easy to digest.  Take over-the-counter or prescription medicines only as told by your doctor. To help cramping and bloating:   Try walking around.  Put heat on your belly (abdomen) as told by your doctor. Use a heat source that your doctor recommends, such as a moist heat pack or a heating pad. ? Put a towel between your skin and the heat source. ? Leave the heat on for 20-30 minutes. ? Remove the heat  if your skin turns bright red. This is especially important if you cannot feel pain, heat, or cold. You can get burned. Eating and drinking   Drink enough fluid to keep your pee (urine) clear or pale yellow.  Return to your normal diet as told by your doctor. Avoid heavy or fried foods that are hard to digest.  Avoid drinking alcohol for as long as told by your doctor. Contact a doctor if:  You have blood  in your poop (stool) 2-3 days after the procedure. Get help right away if:  You have more than a small amount of blood in your poop.  You see large clumps of tissue (blood clots) in your poop.  Your belly is swollen.  You feel sick to your stomach (nauseous).  You throw up (vomit).  You have a fever.  You have belly pain that gets worse, and medicine does not help your pain. Summary  After the procedure, it is common to have a small amount of blood in your poop. You may also have mild cramping and bloating in your belly.  For the first 24 hours after the procedure, do not drive or use machinery, do not sign important documents, and do not drink alcohol.  Get help right away if you have a lot of blood in your poop, feel sick to your stomach, have a fever, or have more belly pain. This information is not intended to replace advice given to you by your health care provider. Make sure you discuss any questions you have with your health care provider. Document Released: 11/22/2010 Document Revised: 08/20/2017 Document Reviewed: 07/14/2016 Elsevier Patient Education  2020 [place].     Gastroesophageal Reflux Disease, Adult Gastroesophageal reflux (GER) happens when acid from the stomach flows up into the tube that connects the mouth and the stomach (esophagus). Normally, food travels down the esophagus and stays in the stomach to be digested. With GER, food and stomach acid sometimes move back up into the esophagus. You may have a disease called gastroesophageal reflux disease (GERD) if the reflux:  Happens often.  Causes frequent or very bad symptoms.  Causes problems such as damage to the esophagus. When this happens, the esophagus becomes sore and swollen (inflamed). Over time, GERD can make small holes (ulcers) in the lining of the esophagus. What are the causes? This condition is caused by a problem with the muscle between the esophagus and the stomach. When this muscle  is weak or not normal, it does not close properly to keep food and acid from coming back up from the stomach. The muscle can be weak because of:  Tobacco use.  Pregnancy.  Having a certain type of hernia (hiatal hernia).  Alcohol use.  Certain foods and drinks, such as coffee, chocolate, onions, and peppermint. What increases the risk? You are more likely to develop this condition if you:  Are overweight.  Have a disease that affects your connective tissue.  Use NSAID medicines. What are the signs or symptoms? Symptoms of this condition include:  Heartburn.  Difficult or painful swallowing.  The feeling of having a lump in the throat.  A bitter taste in the mouth.  Bad breath.  Having a lot of saliva.  Having an upset or bloated stomach.  Belching.  Chest pain. Different conditions can cause chest pain. Make sure you see your doctor if you have chest pain.  Shortness of breath or noisy breathing (wheezing).  Ongoing (chronic) cough or a cough at night.  Wearing away of the surface of teeth (tooth enamel).  Weight loss. How is this treated? Treatment will depend on how bad your symptoms are. Your doctor may suggest:  Changes to your diet.  Medicine.  Surgery. Follow these instructions at home: Eating and drinking   Follow a diet as told by your doctor. You may need to avoid foods and drinks such as: ? Coffee and tea (with or without caffeine). ? Drinks that contain alcohol. ? Energy drinks and sports drinks. ? Bubbly (carbonated) drinks or sodas. ? Chocolate and cocoa. ? Peppermint and mint flavorings. ? Garlic and onions. ? Horseradish. ? Spicy and acidic foods. These include peppers, chili powder, curry powder, vinegar, hot sauces, and BBQ sauce. ? Citrus fruit juices and citrus fruits, such as oranges, lemons, and limes. ? Tomato-based foods. These include red sauce, chili, salsa, and pizza with red sauce. ? Fried and fatty foods. These include  donuts, french fries, potato chips, and high-fat dressings. ? High-fat meats. These include hot dogs, rib eye steak, sausage, ham, and bacon. ? High-fat dairy items, such as whole milk, butter, and cream cheese.  Eat small meals often. Avoid eating large meals.  Avoid drinking large amounts of liquid with your meals.  Avoid eating meals during the 2-3 hours before bedtime.  Avoid lying down right after you eat.  Do not exercise right after you eat. Lifestyle   Do not use any products that contain nicotine or tobacco. These include cigarettes, e-cigarettes, and chewing tobacco. If you need help quitting, ask your doctor.  Try to lower your stress. If you need help doing this, ask your doctor.  If you are overweight, lose an amount of weight that is healthy for you. Ask your doctor about a safe weight loss goal. General instructions  Pay attention to any changes in your symptoms.  Take over-the-counter and prescription medicines only as told by your doctor. Do not take aspirin, ibuprofen, or other NSAIDs unless your doctor says it is okay.  Wear loose clothes. Do not wear anything tight around your waist.  Raise (elevate) the head of your bed about 6 inches (15 cm).  Avoid bending over if this makes your symptoms worse.  Keep all follow-up visits as told by your doctor. This is important. Contact a doctor if:  You have new symptoms.  You lose weight and you do not know why.  You have trouble swallowing or it hurts to swallow.  You have wheezing or a cough that keeps happening.  Your symptoms do not get better with treatment.  You have a hoarse voice. Get help right away if:  You have pain in your arms, neck, jaw, teeth, or back.  You feel sweaty, dizzy, or light-headed.  You have chest pain or shortness of breath.  You throw up (vomit) and your throw-up looks like blood or coffee grounds.  You pass out (faint).  Your poop (stool) is bloody or black.  You  cannot swallow, drink, or eat. Summary  If a person has gastroesophageal reflux disease (GERD), food and stomach acid move back up into the esophagus and cause symptoms or problems such as damage to the esophagus.  Treatment will depend on how bad your symptoms are.  Follow a diet as told by your doctor.  Take all medicines only as told by your doctor. This information is not intended to replace advice given to you by your health care provider. Make sure you discuss any questions you have with your health  care provider. Document Released: 04/07/2008 Document Revised: 04/28/2018 Document Reviewed: 04/28/2018 Elsevier Patient Education  2020 [place].    Hiatal Hernia  A hiatal hernia occurs when part of the stomach slides above the muscle that separates the abdomen from the chest (diaphragm). A person can be born with a hiatal hernia (congenital), or it may develop over time. In almost all cases of hiatal hernia, only the top part of the stomach pushes through the diaphragm. Many people have a hiatal hernia with no symptoms. The larger the hernia, the more likely it is that you will have symptoms. In some cases, a hiatal hernia allows stomach acid to flow back into the tube that carries food from your mouth to your stomach (esophagus). This may cause heartburn symptoms. Severe heartburn symptoms may mean that you have developed a condition called gastroesophageal reflux disease (GERD). What are the causes? This condition is caused by a weakness in the opening (hiatus) where the esophagus passes through the diaphragm to attach to the upper part of the stomach. A person may be born with a weakness in the hiatus, or a weakness can develop over time. What increases the risk? This condition is more likely to develop in:  Older people. Age is a major risk factor for a hiatal hernia, especially if you are over the age of 47.  Pregnant women.  People who are overweight.  People who have  frequent constipation. What are the signs or symptoms? Symptoms of this condition usually develop in the form of GERD symptoms. Symptoms include:  Heartburn.  Belching.  Indigestion.  Trouble swallowing.  Coughing or wheezing.  Sore throat.  Hoarseness.  Chest pain.  Nausea and vomiting. How is this diagnosed? This condition may be diagnosed during testing for GERD. Tests that may be done include:  X-rays of your stomach or chest.  An upper gastrointestinal (GI) series. This is an X-ray exam of your GI tract that is taken after you swallow a chalky liquid that shows up clearly on the X-ray.  Endoscopy. This is a procedure to look into your stomach using a thin, flexible tube that has a tiny camera and light on the end of it. How is this treated? This condition may be treated by:  Dietary and lifestyle changes to help reduce GERD symptoms.  Medicines. These may include: ? Over-the-counter antacids. ? Medicines that make your stomach empty more quickly. ? Medicines that block the production of stomach acid (H2 blockers). ? Stronger medicines to reduce stomach acid (proton pump inhibitors).  Surgery to repair the hernia, if other treatments are not helping. If you have no symptoms, you may not need treatment. Follow these instructions at home: Lifestyle and activity  Do not use any products that contain nicotine or tobacco, such as cigarettes and e-cigarettes. If you need help quitting, ask your health care provider.  Try to achieve and maintain a healthy body weight.  Avoid putting pressure on your abdomen. Anything that puts pressure on your abdomen increases the amount of acid that may be pushed up into your esophagus. ? Avoid bending over, especially after eating. ? Raise the head of your bed by putting blocks under the legs. This keeps your head and esophagus higher than your stomach. ? Do not wear tight clothing around your chest or stomach. ? Try not to strain  when having a bowel movement, when urinating, or when lifting heavy objects. Eating and drinking  Avoid foods that can worsen GERD symptoms. These may include: ?  Fatty foods, like fried foods. ? Citrus fruits, like oranges or lemon. ? Other foods and drinks that contain acid, like orange juice or tomatoes. ? Spicy food. ? Chocolate.  Eat frequent small meals instead of three large meals a day. This helps prevent your stomach from getting too full. ? Eat slowly. ? Do not lie down right after eating. ? Do not eat 1-2 hours before bed.  Do not drink beverages with caffeine. These include cola, coffee, cocoa, and tea.  Do not drink alcohol. General instructions  Take over-the-counter and prescription medicines only as told by your health care provider.  Keep all follow-up visits as told by your health care provider. This is important. Contact a health care provider if:  Your symptoms are not controlled with medicines or lifestyle changes.  You are having trouble swallowing.  You have coughing or wheezing that will not go away. Get help right away if:  Your pain is getting worse.  Your pain spreads to your arms, neck, jaw, teeth, or back.  You have shortness of breath.  You sweat for no reason.  You feel sick to your stomach (nauseous) or you vomit.  You vomit blood.  You have bright red blood in your stools.  You have black, tarry stools. This information is not intended to replace advice given to you by your health care provider. Make sure you discuss any questions you have with your health care provider. Document Released: 01/10/2004 Document Revised: 10/02/2017 Document Reviewed: 05/25/2017 Elsevier Patient Education  2020 [employer].    Diverticulosis  Diverticulosis is a condition that develops when small pouches (diverticula) form in the wall of the large intestine (colon). The colon is where water is absorbed and stool is formed. The pouches form when the  inside layer of the colon pushes through weak spots in the outer layers of the colon. You may have a few pouches or many of them. What are the causes? The cause of this condition is not known. What increases the risk? The following factors may make you more likely to develop this condition:  Being older than age 50. Your risk for this condition increases with age. Diverticulosis is rare among people younger than age 64. By age 35, many people have it.  Eating a low-fiber diet.  Having frequent constipation.  Being overweight.  Not getting enough exercise.  Smoking.  Taking over-the-counter pain medicines, like aspirin and ibuprofen.  Having a family history of diverticulosis. What are the signs or symptoms? In most people, there are no symptoms of this condition. If you do have symptoms, they may include:  Bloating.  Cramps in the abdomen.  Constipation or diarrhea.  Pain in the lower left side of the abdomen. How is this diagnosed? This condition is most often diagnosed during an exam for other colon problems. Because diverticulosis usually has no symptoms, it often cannot be diagnosed independently. This condition may be diagnosed by:  Using a flexible scope to examine the colon (colonoscopy).  Taking an X-ray of the colon after dye has been put into the colon (barium enema).  Doing a CT scan. How is this treated? You may not need treatment for this condition if you have never developed an infection related to diverticulosis. If you have had an infection before, treatment may include:  Eating a high-fiber diet. This may include eating more fruits, vegetables, and grains.  Taking a fiber supplement.  Taking a live bacteria supplement (probiotic).  Taking medicine to relax your  colon.  Taking antibiotic medicines. Follow these instructions at home:  Drink 6-8 glasses of water or more each day to prevent constipation.  Try not to strain when you have a bowel  movement.  If you have had an infection before: ? Eat more fiber as directed by your health care provider or your diet and nutrition specialist (dietitian). ? Take a fiber supplement or probiotic, if your health care provider approves.  Take over-the-counter and prescription medicines only as told by your health care provider.  If you were prescribed an antibiotic, take it as told by your health care provider. Do not stop taking the antibiotic even if you start to feel better.  Keep all follow-up visits as told by your health care provider. This is important. Contact a health care provider if:  You have pain in your abdomen.  You have bloating.  You have cramps.  You have not had a bowel movement in 3 days. Get help right away if:  Your pain gets worse.  Your bloating becomes very bad.  You have a fever or chills, and your symptoms suddenly get worse.  You vomit.  You have bowel movements that are bloody or black.  You have bleeding from your rectum. Summary  Diverticulosis is a condition that develops when small pouches (diverticula) form in the wall of the large intestine (colon).  You may have a few pouches or many of them.  This condition is most often diagnosed during an exam for other colon problems.  If you have had an infection related to diverticulosis, treatment may include increasing the fiber in your diet, taking supplements, or taking medicines. This information is not intended to replace advice given to you by your health care provider. Make sure you discuss any questions you have with your health care provider. Document Released: 07/17/2004 Document Revised: 10/02/2017 Document Reviewed: 09/08/2016 Elsevier Patient Education  [place].    Colon Polyps  Polyps are tissue growths inside the body. Polyps can grow in many places, including the large intestine (colon). A polyp may be a round bump or a mushroom-shaped growth. You could have one  polyp or several. Most colon polyps are noncancerous (benign). However, some colon polyps can become cancerous over time. Finding and removing the polyps early can help prevent this. What are the causes? The exact cause of colon polyps is not known. What increases the risk? You are more likely to develop this condition if you:  Have a family history of colon cancer or colon polyps.  Are older than 11 or older than 45 if you are African American.  Have inflammatory bowel disease, such as ulcerative colitis or Crohn's disease.  Have certain hereditary conditions, such as: ? Familial adenomatous polyposis. ? Lynch syndrome. ? Turcot syndrome. ? Peutz-Jeghers syndrome.  Are overweight.  Smoke cigarettes.  Do not get enough exercise.  Drink too much alcohol.  Eat a diet that is high in fat and red meat and low in fiber.  Had childhood cancer that was treated with abdominal radiation. What are the signs or symptoms? Most polyps do not cause symptoms. If you have symptoms, they may include:  Blood coming from your rectum when having a bowel movement.  Blood in your stool. The stool may look dark red or black.  Abdominal pain.  A change in bowel habits, such as constipation or diarrhea. How is this diagnosed? This condition is diagnosed with a colonoscopy. This is a procedure in which a lighted, flexible  scope is inserted into the anus and then passed into the colon to examine the area. Polyps are sometimes found when a colonoscopy is done as part of routine cancer screening tests. How is this treated? Treatment for this condition involves removing any polyps that are found. Most polyps can be removed during a colonoscopy. Those polyps will then be tested for cancer. Additional treatment may be needed depending on the results of testing. Follow these instructions at home: Lifestyle  Maintain a healthy weight, or lose weight if recommended by your health care  provider.  Exercise every day or as told by your health care provider.  Do not use any products that contain nicotine or tobacco, such as cigarettes and e-cigarettes. If you need help quitting, ask your health care provider.  If you drink alcohol, limit how much you have: ? 0-1 drink a day for women. ? 0-2 drinks a day for men.  Be aware of how much alcohol is in your drink. In the [place] one drink equals one 12 oz bottle of beer (355 mL), one 5 oz glass of wine (148 mL), or one 1 oz shot of hard liquor (44 mL). Eating and drinking   Eat foods that are high in fiber, such as fruits, vegetables, and whole grains.  Eat foods that are high in calcium and vitamin D, such as milk, cheese, yogurt, eggs, liver, fish, and broccoli.  Limit foods that are high in fat, such as fried foods and desserts.  Limit the amount of red meat and processed meat you eat, such as hot dogs, sausage, bacon, and lunch meats. General instructions  Keep all follow-up visits as told by your health care provider. This is important. ? This includes having regularly scheduled colonoscopies. ? Talk to your health care provider about when you need a colonoscopy. Contact a health care provider if:  You have new or worsening bleeding during a bowel movement.  You have new or increased blood in your stool.  You have a change in bowel habits.  You lose weight for no known reason. Summary  Polyps are tissue growths inside the body. Polyps can grow in many places, including the colon.  Most colon polyps are noncancerous (benign), but some can become cancerous over time.  This condition is diagnosed with a colonoscopy.  Treatment for this condition involves removing any polyps that are found. Most polyps can be removed during a colonoscopy. This information is not intended to replace advice given to you by your health care provider. Make sure you discuss any questions you have with your health care  provider. Document Released: 07/16/2004 Document Revised: 02/04/2018 Document Reviewed: 02/04/2018 Elsevier Patient Education  2020 [place] After These instructions provide you with information about caring for yourself after your procedure. Your health care provider may also give you more specific instructions. Your treatment has been planned according to current medical practices, but problems sometimes occur. Call your health care provider if you have any problems or questions after your procedure. What can I expect after the procedure? After your procedure, you may:  Feel sleepy for several hours.  Feel clumsy and have poor balance for several hours.  Feel forgetful about what happened after the procedure.  Have poor judgment for several hours.  Feel nauseous or vomit.  Have a sore throat if you had a breathing tube during the procedure. Follow these instructions at home: For at least 24 hours after the procedure:  Have a responsible adult stay with you. It is important to have someone help care for you until you are awake and alert.  Rest as needed.  Do not: ? Participate in activities in which you could fall or become injured. ? Drive. ? Use heavy machinery. ? Drink alcohol. ? Take sleeping pills or medicines that cause drowsiness. ? Make important decisions or sign legal documents. ? Take care of children on your own. Eating and drinking  Follow the diet that is recommended by your health care provider.  If you vomit, drink water, juice, or soup when you can drink without vomiting.  Make sure you have little or no nausea before eating solid foods. General instructions  Take over-the-counter and prescription medicines only as told by your health care provider.  If you have sleep apnea, surgery and certain medicines can increase your risk for breathing problems. Follow instructions from your health care provider  about wearing your sleep device: ? Anytime you are sleeping, including during daytime naps. ? While taking prescription pain medicines, sleeping medicines, or medicines that make you drowsy.  If you smoke, do not smoke without supervision.  Keep all follow-up visits as told by your health care provider. This is important. Contact a health care provider if:  You keep feeling nauseous or you keep vomiting.  You feel light-headed.  You develop a rash.  You have a fever. Get help right away if:  You have trouble breathing. Summary  For several hours after your procedure, you may feel sleepy and have poor judgment.  Have a responsible adult stay with you for at least 24 hours or until you are awake and alert. This information is not intended to replace advice given to you by your health care provider. Make sure you discuss any questions you have with your health care provider. Document Released: 02/10/2016 Document Revised: 01/18/2018 Document Reviewed: 02/10/2016 Elsevier Patient Education  2020 [employer].

## 2019-09-16 NOTE — Op Note (Signed)
[facility] Patient Name: Ruben Burton Procedure Date: 09/16/2019 9:13 AM MRN: [id number] Date of Birth: Mar 08, 1941 Attending MD: Hildred Laser , MD CSN: [id number] Age: 78 Admit Type: Outpatient Procedure:                Upper GI endoscopy Indications:              Epigastric abdominal pain Providers:                Hildred Laser, MD, Otis Peak B. Sharon Seller, RN, Raphael Gibney, Technician Referring MD:             Marton Redwood, MD Medicines:                Propofol per Anesthesia Complications:            No immediate complications. Estimated Blood Loss:     Estimated blood loss was minimal. Procedure:                Pre-Anesthesia Assessment:                           - Prior to the procedure, a History and Physical                            was performed, and patient medications and                            allergies were reviewed. The patient's tolerance of                            previous anesthesia was also reviewed. The risks                            and benefits of the procedure and the sedation                            options and risks were discussed with the patient.                            All questions were answered, and informed consent                            was obtained. Prior Anticoagulants: The patient has                            taken no previous anticoagulant or antiplatelet                            agents except for aspirin. ASA Grade Assessment:                            III - A patient with severe systemic disease. After  reviewing the risks and benefits, the patient was                            deemed in satisfactory condition to undergo the                            procedure.                           After obtaining informed consent, the endoscope was                            passed under direct vision. Throughout the                            procedure, the patient's blood  pressure, pulse, and                            oxygen saturations were monitored continuously. The                            [id number]-[id number] scope was introduced through the                            mouth, and advanced to the second part of duodenum.                            The upper GI endoscopy was accomplished without                            difficulty. The patient tolerated the procedure                            well. Scope In: [id number] AM Scope Out: 9:53:19 AM Total Procedure Duration: 0 hours 7 minutes 1 second  Findings:      The examined esophagus was normal.      The Z-line was regular and was found 33 cm from the incisors.      A 7 cm hiatal hernia was present.      A single small angioectasia with no bleeding was found in the gastric       antrum.      Patchy mild inflammation characterized by congestion (edema), erythema       and granularity was found in the gastric antrum. Biopsies were taken       with a cold forceps for histology. The pathology specimen was placed       into Bottle Number 1.      The exam of the stomach was otherwise normal.      The duodenal bulb and second portion of the duodenum were normal. Impression:               - Normal esophagus.                           - Z-line regular, 33 cm from the incisors.                           -  7 cm hiatal hernia.                           - A single non-bleeding angioectasia in the stomach.                           - Gastritis. Biopsied.                           - Normal duodenal bulb and second portion of the                            duodenum. Moderate Sedation:      Per Anesthesia Care Recommendation:           - Patient has a contact number available for                            emergencies. The signs and symptoms of potential                            delayed complications were discussed with the                            patient. Return to normal activities tomorrow.                             Written discharge instructions were provided to the                            patient.                           - Continue present medications.                           - Await pathology results.                           - See the other procedure note for documentation of                            additional recommendations. Procedure Code(s):        --- Professional ---                           [phone], Esophagogastroduodenoscopy, flexible,                            transoral; with biopsy, single or multiple Diagnosis Code(s):        --- Professional ---                           K44.9, Diaphragmatic hernia without obstruction or                            gangrene  K31.819, Angiodysplasia of stomach and duodenum                            without bleeding                           K29.70, Gastritis, unspecified, without bleeding                           R10.13, Epigastric pain CPT copyright 2019 American Medical Association. All rights reserved. The codes documented in this report are preliminary and upon coder review may  be revised to meet current compliance requirements. Hildred Laser, MD Hildred Laser, MD 09/16/2019 10:32:39 AM This report has been signed electronically. Number of Addenda: 0

## 2019-09-16 NOTE — Anesthesia Postprocedure Evaluation (Signed)
Anesthesia Post Note  Patient: Ruben Burton  Procedure(s) Performed: ESOPHAGOGASTRODUODENOSCOPY (EGD) WITH PROPOFOL (N/A ) COLONOSCOPY WITH PROPOFOL (N/A ) POLYPECTOMY BIOPSY  Patient location during evaluation: PACU Anesthesia Type: General Level of consciousness: awake and alert Pain management: pain level controlled Vital Signs Assessment: post-procedure vital signs reviewed and stable Respiratory status: spontaneous breathing, nonlabored ventilation and respiratory function stable Cardiovascular status: stable Postop Assessment: no apparent nausea or vomiting Anesthetic complications: no     Last Vitals:  Vitals:   09/16/19 0838  BP: 132/78  Pulse: 68  Resp: 18  Temp: 36.7 C  SpO2: 100%    Last Pain:  Vitals:   09/16/19 0942  TempSrc:   PainSc: 0-No pain                 Ziad Maye Hristova

## 2019-09-16 NOTE — Anesthesia Preprocedure Evaluation (Signed)
Anesthesia Evaluation  Patient identified by MRN, date of birth, ID band Patient awake    Reviewed: Allergy & Precautions, NPO status , Patient's Chart, lab work & pertinent test results  Airway Mallampati: I  TM Distance: >3 FB Neck ROM: Full    Dental no notable dental hx. (+) Teeth Intact   Pulmonary neg pulmonary ROS, former smoker,    Pulmonary exam normal breath sounds clear to auscultation       Cardiovascular Exercise Tolerance: Good hypertension, Pt. on medications + CAD  Normal cardiovascular exam+ dysrhythmias Atrial Fibrillation I Rhythm:Regular Rate:Normal  Mare Ferrari  Denies any NTG use in over a year active  Bypass ~2011- PAF after  No current PAF issues    Neuro/Psych negative neurological ROS  negative psych ROS   GI/Hepatic Neg liver ROS, GERD  Medicated and Controlled,  Endo/Other  negative endocrine ROS  Renal/GU negative Renal ROS  negative genitourinary   Musculoskeletal  (+) Arthritis , Osteoarthritis,    Abdominal   Peds negative pediatric ROS (+)  Hematology negative hematology ROS (+)   Anesthesia Other Findings   Reproductive/Obstetrics negative OB ROS                             Anesthesia Physical Anesthesia Plan  ASA: III  Anesthesia Plan: General   Post-op Pain Management:    Induction: Intravenous  PONV Risk Score and Plan: 2 and Propofol infusion, TIVA and Treatment may vary due to age or medical condition  Airway Management Planned: Nasal Cannula and Simple Face Mask  Additional Equipment:   Intra-op Plan:   Post-operative Plan:   Informed Consent: I have reviewed the patients History and Physical, chart, labs and discussed the procedure including the risks, benefits and alternatives for the proposed anesthesia with the patient or authorized representative who has indicated his/her understanding and acceptance.     Dental advisory  given  Plan Discussed with: CRNA  Anesthesia Plan Comments: (Plan Full PPE use  Plan GA with GETA as needed d/w pt -WTP with same after Q&A)        Anesthesia Quick Evaluation

## 2019-09-16 NOTE — Transfer of Care (Signed)
Immediate Anesthesia Transfer of Care Note  Patient: Ruben Burton  Procedure(s) Performed: ESOPHAGOGASTRODUODENOSCOPY (EGD) WITH PROPOFOL (N/A ) COLONOSCOPY WITH PROPOFOL (N/A ) POLYPECTOMY BIOPSY  Patient Location: PACU  Anesthesia Type:General  Level of Consciousness: awake, alert  and oriented  Airway & Oxygen Therapy: Patient Spontanous Breathing  Post-op Assessment: Report given to RN and Post -op Vital signs reviewed and stable  Post vital signs: Reviewed and stable  Last Vitals:  Vitals Value Taken Time  BP 115/74 09/16/19 1027  Temp    Pulse 109 09/16/19 1028  Resp 20 09/16/19 1028  SpO2 94 % 09/16/19 1028  Vitals shown include unvalidated device data.  Last Pain:  Vitals:   09/16/19 0942  TempSrc:   PainSc: 0-No pain      Patients Stated Pain Goal: 5 ([id number])  Complications: No apparent anesthesia complications

## 2019-09-16 NOTE — Op Note (Signed)
[facility] Patient Name: Ruben Burton Procedure Date: 09/16/2019 9:56 AM MRN: [id number] Date of Birth: 02-Oct-1941 Attending MD: Hildred Laser , MD CSN: [id number] Age: 78 Admit Type: Outpatient Procedure:                Colonoscopy Indications:              High risk colon cancer surveillance: Personal                            history of colonic polyps Providers:                Hildred Laser, MD, Otis Peak B. Sharon Seller, RN, Raphael Gibney, Technician Referring MD:             Marton Redwood, MD Medicines:                Propofol per Anesthesia Complications:            No immediate complications. Estimated Blood Loss:     Estimated blood loss was minimal. Procedure:                Pre-Anesthesia Assessment:                           - Prior to the procedure, a History and Physical                            was performed, and patient medications and                            allergies were reviewed. The patient's tolerance of                            previous anesthesia was also reviewed. The risks                            and benefits of the procedure and the sedation                            options and risks were discussed with the patient.                            All questions were answered, and informed consent                            was obtained. Prior Anticoagulants: The patient has                            taken no previous anticoagulant or antiplatelet                            agents except for aspirin. ASA Grade Assessment:  III - A patient with severe systemic disease. After                            reviewing the risks and benefits, the patient was                            deemed in satisfactory condition to undergo the                            procedure.                           - Prior to the procedure, a History and Physical                            was performed, and patient medications  and                            allergies were reviewed. The patient's tolerance of                            previous anesthesia was also reviewed. The risks                            and benefits of the procedure and the sedation                            options and risks were discussed with the patient.                            All questions were answered, and informed consent                            was obtained. After reviewing the risks and                            benefits, the patient was deemed in satisfactory                            condition to undergo the procedure.                           After obtaining informed consent, the colonoscope                            was passed under direct vision. Throughout the                            procedure, the patient's blood pressure, pulse, and                            oxygen saturations were monitored continuously. The  [id number]-[id number]) scope was introduced through                            the anus and advanced to the the cecum, identified                            by appendiceal orifice and ileocecal valve. The                            colonoscopy was performed without difficulty. The                            patient tolerated the procedure well. The quality                            of the bowel preparation was excellent. The                            ileocecal valve, appendiceal orifice, and rectum                            were photographed. Scope In: 9:58:01 AM Scope Out: 10:17:37 AM Scope Withdrawal Time: 0 hours 14 minutes 36 seconds  Total Procedure Duration: 0 hours 19 minutes 36 seconds  Findings:      The perianal and digital rectal examinations were normal.      A diminutive polyp was found in the ileocecal valve. The polyp was       sessile. Biopsies were taken with a cold forceps for histology. The       pathology specimen was placed into Bottle Number 2.       A 5 mm polyp was found in the proximal transverse colon. The polyp was       sessile. The polyp was removed with a cold snare. Resection and       retrieval were complete. The pathology specimen was placed into Bottle       Number 2.      A few diverticula were found in the sigmoid colon.      The retroflexed view of the distal rectum and anal verge was normal and       showed no anal or rectal abnormalities. Impression:               - One diminutive polyp at the ileocecal valve.                            Biopsied.                           - One 5 mm polyp in the proximal transverse colon,                            removed with a cold snare. Resected and retrieved.                           - Diverticulosis in the sigmoid colon. Moderate Sedation:      Per Anesthesia Care Recommendation:           -  Patient has a contact number available for                            emergencies. The signs and symptoms of potential                            delayed complications were discussed with the                            patient. Return to normal activities tomorrow.                            Written discharge instructions were provided to the                            patient.                           - High fiber diet today.                           - Continue present medications.                           - No aspirin, ibuprofen, naproxen, or other                            non-steroidal anti-inflammatory drugs for 1 day.                           - Await pathology results.                           - Repeat colonoscopy is recommended. The                            colonoscopy date will be determined after pathology                            results from today's exam become available for                            review. Procedure Code(s):        --- Professional ---                           [phone], Colonoscopy, flexible; with removal of                            tumor(s),  polyp(s), or other lesion(s) by snare                            technique                           [id number], 75, Colonoscopy, flexible; with biopsy,  single or multiple Diagnosis Code(s):        --- Professional ---                           Z86.010, Personal history of colonic polyps                           K63.5, Polyp of colon                           K57.30, Diverticulosis of large intestine without                            perforation or abscess without bleeding CPT copyright 2019 American Medical Association. All rights reserved. The codes documented in this report are preliminary and upon coder review may  be revised to meet current compliance requirements. Hildred Laser, MD Hildred Laser, MD 09/16/2019 10:39:35 AM This report has been signed electronically. Number of Addenda: 0

## 2019-09-19 LAB — SURGICAL PATHOLOGY

## 2019-09-22 ENCOUNTER — Encounter (HOSPITAL_COMMUNITY): Payer: Self-pay | Admitting: Internal Medicine

## 2019-12-09 DIAGNOSIS — Z23 Encounter for immunization: Secondary | ICD-10-CM | POA: Diagnosis not present

## 2019-12-23 DIAGNOSIS — J189 Pneumonia, unspecified organism: Secondary | ICD-10-CM | POA: Diagnosis not present

## 2019-12-23 DIAGNOSIS — R05 Cough: Secondary | ICD-10-CM | POA: Diagnosis not present

## 2019-12-23 DIAGNOSIS — R918 Other nonspecific abnormal finding of lung field: Secondary | ICD-10-CM | POA: Diagnosis not present

## 2019-12-23 DIAGNOSIS — Z1152 Encounter for screening for COVID-19: Secondary | ICD-10-CM | POA: Diagnosis not present

## 2020-01-04 ENCOUNTER — Other Ambulatory Visit: Payer: Self-pay

## 2020-01-04 ENCOUNTER — Ambulatory Visit: Payer: Medicare Other | Attending: Internal Medicine

## 2020-01-04 DIAGNOSIS — Z20822 Contact with and (suspected) exposure to covid-19: Secondary | ICD-10-CM | POA: Diagnosis not present

## 2020-01-05 ENCOUNTER — Telehealth: Payer: Self-pay

## 2020-01-05 LAB — NOVEL CORONAVIRUS, NAA: SARS-CoV-2, NAA: NOT DETECTED

## 2020-01-05 NOTE — Telephone Encounter (Signed)
Patient's wife called (on DPR) and she was informed that her husbands COVID-19 test  Was negative, not detected. She verbalized understanding .

## 2020-01-05 NOTE — Telephone Encounter (Signed)
Wife checking on COVID 19 results. Not available yet. °

## 2020-01-10 DIAGNOSIS — R05 Cough: Secondary | ICD-10-CM | POA: Diagnosis not present

## 2020-01-10 DIAGNOSIS — Z20818 Contact with and (suspected) exposure to other bacterial communicable diseases: Secondary | ICD-10-CM | POA: Diagnosis not present

## 2020-01-10 DIAGNOSIS — J189 Pneumonia, unspecified organism: Secondary | ICD-10-CM | POA: Diagnosis not present

## 2020-01-10 DIAGNOSIS — I10 Essential (primary) hypertension: Secondary | ICD-10-CM | POA: Diagnosis not present

## 2020-01-13 DIAGNOSIS — Z23 Encounter for immunization: Secondary | ICD-10-CM | POA: Diagnosis not present

## 2020-01-20 DIAGNOSIS — R208 Other disturbances of skin sensation: Secondary | ICD-10-CM | POA: Diagnosis not present

## 2020-01-20 DIAGNOSIS — L820 Inflamed seborrheic keratosis: Secondary | ICD-10-CM | POA: Diagnosis not present

## 2020-01-20 DIAGNOSIS — L570 Actinic keratosis: Secondary | ICD-10-CM | POA: Diagnosis not present

## 2020-02-10 DIAGNOSIS — H6121 Impacted cerumen, right ear: Secondary | ICD-10-CM | POA: Diagnosis not present

## 2020-02-10 DIAGNOSIS — K1121 Acute sialoadenitis: Secondary | ICD-10-CM | POA: Diagnosis not present

## 2020-02-27 DIAGNOSIS — K1121 Acute sialoadenitis: Secondary | ICD-10-CM | POA: Diagnosis not present

## 2020-02-27 DIAGNOSIS — H6121 Impacted cerumen, right ear: Secondary | ICD-10-CM | POA: Diagnosis not present

## 2020-04-03 DIAGNOSIS — L820 Inflamed seborrheic keratosis: Secondary | ICD-10-CM | POA: Diagnosis not present

## 2020-04-03 DIAGNOSIS — D225 Melanocytic nevi of trunk: Secondary | ICD-10-CM | POA: Diagnosis not present

## 2020-04-03 DIAGNOSIS — Z85828 Personal history of other malignant neoplasm of skin: Secondary | ICD-10-CM | POA: Diagnosis not present

## 2020-04-03 DIAGNOSIS — L308 Other specified dermatitis: Secondary | ICD-10-CM | POA: Diagnosis not present

## 2020-04-03 DIAGNOSIS — L905 Scar conditions and fibrosis of skin: Secondary | ICD-10-CM | POA: Diagnosis not present

## 2020-04-03 DIAGNOSIS — L821 Other seborrheic keratosis: Secondary | ICD-10-CM | POA: Diagnosis not present

## 2020-04-03 DIAGNOSIS — L814 Other melanin hyperpigmentation: Secondary | ICD-10-CM | POA: Diagnosis not present

## 2020-06-06 ENCOUNTER — Other Ambulatory Visit (INDEPENDENT_AMBULATORY_CARE_PROVIDER_SITE_OTHER): Payer: Self-pay | Admitting: *Deleted

## 2020-06-06 MED ORDER — ESOMEPRAZOLE MAGNESIUM 40 MG PO CPDR: 40.0000 mg | DELAYED_RELEASE_CAPSULE | Freq: Two times a day (BID) | ORAL | 5 refills | Status: DC

## 2020-06-07 ENCOUNTER — Other Ambulatory Visit (INDEPENDENT_AMBULATORY_CARE_PROVIDER_SITE_OTHER): Payer: Self-pay | Admitting: Gastroenterology

## 2020-06-07 MED ORDER — ESOMEPRAZOLE MAGNESIUM 40 MG PO CPDR: 40.0000 mg | DELAYED_RELEASE_CAPSULE | Freq: Two times a day (BID) | ORAL | 0 refills | Status: DC

## 2020-06-07 NOTE — Progress Notes (Signed)
Refill sent to [facility] per request

## 2020-06-18 ENCOUNTER — Encounter: Payer: Self-pay | Admitting: Cardiovascular Disease

## 2020-06-18 ENCOUNTER — Other Ambulatory Visit: Payer: Self-pay

## 2020-06-18 ENCOUNTER — Ambulatory Visit (INDEPENDENT_AMBULATORY_CARE_PROVIDER_SITE_OTHER): Payer: Medicare Other | Admitting: Cardiovascular Disease

## 2020-06-18 VITALS — BP 114/70 | HR 53 | Ht 73.0 in | Wt 195.6 lb

## 2020-06-18 DIAGNOSIS — I480 Paroxysmal atrial fibrillation: Secondary | ICD-10-CM

## 2020-06-18 DIAGNOSIS — I25810 Atherosclerosis of coronary artery bypass graft(s) without angina pectoris: Secondary | ICD-10-CM | POA: Diagnosis not present

## 2020-06-18 DIAGNOSIS — E7800 Pure hypercholesterolemia, unspecified: Secondary | ICD-10-CM

## 2020-06-18 DIAGNOSIS — I10 Essential (primary) hypertension: Secondary | ICD-10-CM

## 2020-06-18 NOTE — Patient Instructions (Signed)
Medication Instructions:  Your provider recommends that you continue on your current medications as directed. Please refer to the Current Medication list given to you today.   *If you need a refill on your cardiac medications before your next appointment, please call your pharmacy*   Follow-Up: At [facility], you and your health needs are our priority.  As part of our continuing mission to provide you with exceptional heart care, we have created designated Provider Care Teams.  These Care Teams include your primary Cardiologist (physician) and Advanced Practice Providers (APPs -  Physician Assistants and Nurse Practitioners) who all work together to provide you with the care you need, when you need it. Your next appointment:   12 month(s) The format for your next appointment:   In Person Provider:   You may see [name], MD or one of the following Advanced Practice Providers on your designated Care Team:    Richardson Dopp, PA-C  [place], [place]

## 2020-06-18 NOTE — Progress Notes (Signed)
Chief Complaint  Patient presents with  . Follow-up    CAD     History of Present Illness: 79 yo male with history of CAD s/p CABG in 2011, HTN, HLD and post-op atrial fibrillation here today for follow up.  He was admitted in April 2011 with a NSTEMI and was found to have severe disease in the LAD and Diagonal. He underwent 2V CABG with LIMA to LAD and SVG to Diagonal. Postop course was complicated by atrial fibrillation. Echo 2011 with LVEF 55-60%, mildly dilated RV. No evidence of atrial fib on post-op cardiac monitor in 2011.  Coumadin was discontinued. Stress myoview February 2016 with no ischemia. PVCs noted on EKG during stress. Admitted to [facility] December 2017 with chest pain. Cardiac cath 10/16/16 with stable CAD (occluded mid LAD and Diagonal with patent LIMA to LAD and patent SVG to Diagonal). The RCA had mild to moderate plaque. The Circumflex have no obstructive disease. PVCs noted on monitor during hospitalization. Echo December 2018 with normal LV systolic function, grade 2 diastolic dysfunction, no valve disease.   He is here today for follow up. The patient denies any chest pain, dyspnea, palpitations, lower extremity edema, orthopnea, PND, dizziness, near syncope or syncope.   Primary Care Physician: Marton Redwood, MD    Past Medical History:  Diagnosis Date  . Arthritis    "mild; fingers" (10/16/2016)  . CAD, ARTERY BYPASS GRAFT 03/07/2010   Qualifier: Diagnosis of  By: Orville Govern CMA, [place]    . Chest pain 10/16/2016  . Coronary artery disease    a. CABG in 2011 b. Cath 10/16/16 - chronic total occlusion of mid LAD with patietn LIMA to mid LAD; severe ostial stenosis to large diagonal with patent SVG to diagonal; moderate non obstrctive RCA, normal LV function. Tx Rx  . GERD 03/07/2010   Qualifier: Diagnosis of  By: Ronne Binning    . GERD (gastroesophageal reflux disease)   . Hx of cardiovascular stress test    a. ETT-MV 1/14: no ischemia; not gated  . Hyperlipidemia     . HYPERLIPIDEMIA 03/07/2010   Qualifier: Diagnosis of  By: Ronne Binning    . Hypertension   . HYPERTENSION 03/07/2010   Qualifier: Diagnosis of  By: Ronne Binning    . PAROXYSMAL ATRIAL FIBRILLATION 03/07/2010   Qualifier: Diagnosis of  By: Orville Govern CMA, Carol    . Paroxysmal atrial fibrillation (HCC)   . Pleural effusion   . PLEURAL EFFUSION, LEFT 03/07/2010   Qualifier: Diagnosis of  By: Orville Govern CMA, Carol    . Precordial pain   . Unstable angina ([place]) 10/16/2016  . Unstable angina pectoris ([place]) 10/16/2016    Past Surgical History:  Procedure Laterality Date  . BIOPSY  09/16/2019   Procedure: BIOPSY;  Surgeon: Rogene Houston, MD;  [facility];  Service: Endoscopy;;  gastric   . CARDIAC CATHETERIZATION  ~ 2011; 10/16/2016  . CARDIAC CATHETERIZATION N/A 10/16/2016   Procedure: Left Heart Cath and Cors/Grafts Angiography;  Surgeon: Burnell Blanks, MD;  Location: [place] [place] [facility];  Service: Cardiovascular;  Laterality: N/A;  . CHOLECYSTECTOMY OPEN  ~ 1992  . COLONOSCOPY N/A 12/01/2013   Procedure: COLONOSCOPY;  Surgeon: Rogene Houston, MD;  Location: [facility];  Service: Endoscopy;  Laterality: N/A;  830  . COLONOSCOPY WITH PROPOFOL N/A 09/16/2019   Procedure: COLONOSCOPY WITH PROPOFOL;  Surgeon: Rogene Houston, MD;  Location: [facility];  Service: Endoscopy;  Laterality: N/A;  .  CORONARY ARTERY BYPASS GRAFT  02-13-10   2V (LIMA to LAD, SVG to diagonal)  . ESOPHAGOGASTRODUODENOSCOPY (EGD) WITH ESOPHAGEAL DILATION    . ESOPHAGOGASTRODUODENOSCOPY (EGD) WITH PROPOFOL N/A 09/16/2019   Procedure: ESOPHAGOGASTRODUODENOSCOPY (EGD) WITH PROPOFOL;  Surgeon: Rogene Houston, MD;  Location: [facility];  Service: Endoscopy;  Laterality: N/A;  955am  . EXPLORATORY LAPAROTOMY  ~ 1995   for perforated duodenal ulcer  . HYDROCELE EXCISION    . POLYPECTOMY  09/16/2019   Procedure: POLYPECTOMY;  Surgeon: Rogene Houston, MD;  Location: [facility];  Service:  Endoscopy;;  colon    Current Outpatient Medications  Medication Sig Dispense Refill  . aspirin EC 81 MG tablet Take 1 tablet (81 mg total) by mouth every evening.    . B Complex-C (B-COMPLEX WITH VITAMIN C) tablet Take 1 tablet by mouth daily after breakfast.    . calcium carbonate (OS-CAL) 1250 (500 Ca) MG chewable tablet Chew 1 tablet by mouth 2 (two) times daily.     Marland Kitchen esomeprazole (NEXIUM) 40 MG capsule Take 1 capsule (40 mg total) by mouth 2 (two) times daily before a meal. 180 capsule 0  . metoprolol (LOPRESSOR) 25 MG tablet Take one tablet by mouth twice daily. (Patient taking differently: Take 25 mg by mouth 2 (two) times daily. ) 180 tablet 3  . mometasone (ELOCON) 0.1 % cream Apply 1 application topically daily as needed (ear itching.).     Marland Kitchen nitroGLYCERIN (NITROSTAT) 0.4 MG SL tablet Place 1 tablet (0.4 mg total) under the tongue every 5 (five) minutes as needed for chest pain. (Patient taking differently: Place 0.4 mg under the tongue every 5 (five) minutes x 3 doses as needed for chest pain. ) 25 tablet 11  . polyethylene glycol-electrolytes (NULYTELY/GOLYTELY) 420 g solution Take 4,000 mLs by mouth once.    . rosuvastatin (CRESTOR) 20 MG tablet TAKE 1 TABLET DAILY (Patient taking differently: Take 20 mg by mouth every evening. ) 90 tablet 2  . tamsulosin (FLOMAX) 0.4 MG CAPS capsule Take 0.4 mg by mouth daily.      No current facility-administered medications for this visit.    Allergies  Allergen Reactions  . Omeprazole Nausea Only  . Pantoprazole Nausea Only  . Oxytetracycline Other (See Comments)    Passes out.    Social History   Socioeconomic History  . Marital status: Married    Spouse name: Not on file  . Number of children: 2  . Years of education: Not on file  . Highest education level: Not on file  Occupational History  . Occupation: retirede    Fish farm manager: RETIRED  Tobacco Use  . Smoking status: Former Smoker    Packs/day: 0.25    Years: 1.00    Pack  years: 0.25    Types: Cigarettes    Quit date: 1960    Years since quitting: 61.6  . Smokeless tobacco: Never Used  Vaping Use  . Vaping Use: Never used  Substance and Sexual Activity  . Alcohol use: No  . Drug use: No  . Sexual activity: Yes  Other Topics Concern  . Not on file  Social History Narrative  . Not on file   Social Determinants of Health   Financial Resource Strain:   . Difficulty of Paying Living Expenses:   Food Insecurity:   . Worried About Charity fundraiser in the Last Year:   . Arboriculturist in the Last Year:   Transportation Needs:   .  Lack of Transportation (Medical):   Marland Kitchen Lack of Transportation (Non-Medical):   Physical Activity:   . Days of Exercise per Week:   . Minutes of Exercise per Session:   Stress:   . Feeling of Stress :   Social Connections:   . Frequency of Communication with Friends and Family:   . Frequency of Social Gatherings with Friends and Family:   . Attends Religious Services:   . Active Member of Clubs or Organizations:   . Attends Archivist Meetings:   Marland Kitchen Marital Status:   Intimate Partner Violence:   . Fear of Current or Ex-Partner:   . Emotionally Abused:   Marland Kitchen Physically Abused:   . Sexually Abused:     Family History  Problem Relation Age of Onset  . Other Mother        alive- healthy  . Hypertension Mother   . Stroke Father        deceased  . Hypertension Father   . Other Brother        CABG  . Heart attack Brother   . Hypertension Brother   . Colon cancer Neg Hx     Review of Systems:  As stated in the HPI and otherwise negative.   BP 114/70   Pulse (!) 53   Ht 6\' 1"  (1.854 m)   Wt 195 lb 9.6 oz (88.7 kg)   SpO2 98%   BMI 25.81 kg/m   Physical Examination:  General: Well developed, well nourished, NAD  HEENT: OP clear, mucus membranes moist  SKIN: warm, dry. No rashes. Neuro: No focal deficits  Musculoskeletal: Muscle strength 5/5 all ext  Psychiatric: Mood and affect normal    Neck: No JVD, no carotid bruits, no thyromegaly, no lymphadenopathy.  Lungs:Clear bilaterally, no wheezes, rhonci, crackles Cardiovascular: Regular rate and rhythm. No murmurs, gallops or rubs. Abdomen:Soft. Bowel sounds present. Non-tender.  Extremities: No lower extremity edema. Pulses are 2 + in the bilateral DP/PT.  Cardiac cath December 2017: Diagnostic Diagram        Echo 10/19/17: Left ventricle: The cavity size was normal. Wall thickness was   normal. Systolic function was normal. The estimated ejection   fraction was in the range of 60% to 65%. Wall motion was normal;   there were no regional wall motion abnormalities. Features are   consistent with a pseudonormal left ventricular filling pattern,   with concomitant abnormal relaxation and increased filling   pressure (grade 2 diastolic dysfunction).  EKG:  EKG is ordered today. The ekg ordered today demonstrates sinus brady  Recent Labs: 07/29/2019: ALT 21; BUN 11; Creat 1.04; Hemoglobin 16.5; Platelets 148; Potassium 4.2; Sodium 142   Lipids: followed in primary care:   Wt Readings from Last 3 Encounters:  06/18/20 195 lb 9.6 oz (88.7 kg)  07/28/19 194 lb 9.6 oz (88.3 kg)  05/05/19 189 lb 12.8 oz (86.1 kg)     Other studies Reviewed: Additional studies/ records that were reviewed today include:. Review of the above records demonstrates:   Assessment and Plan:   1. Coronary Artery Disease without angina: He is s/p CABG in 2011. Cardiac cath in December 2017 with stable CAD with patent bypass grafts. Echo December 2017 with normal LV systolic function. Continue ASA, beta blocker and statin  2. Hypertension: BP is controlled. Continue current therapy  3. Hyperlipidemia: Lipids managed in primary care. LDL 59 in 2020. Continue statin  4. Paroxysmal atrial fibrillation: He has had no known recurrences of atrial fibrillation  since his post-op period in 2011. Will continue Lopressor  Current medicines are  reviewed at length with the patient today.  The patient does not have concerns regarding medicines.  The following changes have been made:  no change  Labs/ tests ordered today include:   Orders Placed This Encounter  Procedures  . EKG 12-Lead    Disposition:   FU with me in 12 months  Signed, [name], MD 06/18/2020 9:48 AM    [facility] [address], [place], [place]  [address] Phone: [phone]; Fax: [phone]

## 2020-06-26 DIAGNOSIS — M859 Disorder of bone density and structure, unspecified: Secondary | ICD-10-CM | POA: Diagnosis not present

## 2020-06-26 DIAGNOSIS — M8580 Other specified disorders of bone density and structure, unspecified site: Secondary | ICD-10-CM | POA: Diagnosis not present

## 2020-06-26 DIAGNOSIS — E538 Deficiency of other specified B group vitamins: Secondary | ICD-10-CM | POA: Diagnosis not present

## 2020-06-26 DIAGNOSIS — R7301 Impaired fasting glucose: Secondary | ICD-10-CM | POA: Diagnosis not present

## 2020-06-26 DIAGNOSIS — E785 Hyperlipidemia, unspecified: Secondary | ICD-10-CM | POA: Diagnosis not present

## 2020-06-26 DIAGNOSIS — Z125 Encounter for screening for malignant neoplasm of prostate: Secondary | ICD-10-CM | POA: Diagnosis not present

## 2020-06-26 DIAGNOSIS — E039 Hypothyroidism, unspecified: Secondary | ICD-10-CM | POA: Diagnosis not present

## 2020-07-03 DIAGNOSIS — I25810 Atherosclerosis of coronary artery bypass graft(s) without angina pectoris: Secondary | ICD-10-CM | POA: Diagnosis not present

## 2020-07-03 DIAGNOSIS — R8290 Unspecified abnormal findings in urine: Secondary | ICD-10-CM | POA: Diagnosis not present

## 2020-07-03 DIAGNOSIS — R7301 Impaired fasting glucose: Secondary | ICD-10-CM | POA: Diagnosis not present

## 2020-07-03 DIAGNOSIS — M8580 Other specified disorders of bone density and structure, unspecified site: Secondary | ICD-10-CM | POA: Diagnosis not present

## 2020-07-03 DIAGNOSIS — R82998 Other abnormal findings in urine: Secondary | ICD-10-CM | POA: Diagnosis not present

## 2020-07-03 DIAGNOSIS — D696 Thrombocytopenia, unspecified: Secondary | ICD-10-CM | POA: Diagnosis not present

## 2020-07-03 DIAGNOSIS — Z1212 Encounter for screening for malignant neoplasm of rectum: Secondary | ICD-10-CM | POA: Diagnosis not present

## 2020-07-03 DIAGNOSIS — I700 Atherosclerosis of aorta: Secondary | ICD-10-CM | POA: Diagnosis not present

## 2020-07-03 DIAGNOSIS — E039 Hypothyroidism, unspecified: Secondary | ICD-10-CM | POA: Diagnosis not present

## 2020-07-03 DIAGNOSIS — E785 Hyperlipidemia, unspecified: Secondary | ICD-10-CM | POA: Diagnosis not present

## 2020-07-03 DIAGNOSIS — Z0000 Encounter for general adult medical examination without abnormal findings: Secondary | ICD-10-CM | POA: Diagnosis not present

## 2020-07-03 DIAGNOSIS — I252 Old myocardial infarction: Secondary | ICD-10-CM | POA: Diagnosis not present

## 2020-07-03 DIAGNOSIS — I10 Essential (primary) hypertension: Secondary | ICD-10-CM | POA: Diagnosis not present

## 2020-07-11 DIAGNOSIS — H35033 Hypertensive retinopathy, bilateral: Secondary | ICD-10-CM | POA: Diagnosis not present

## 2020-09-14 DIAGNOSIS — R42 Dizziness and giddiness: Secondary | ICD-10-CM | POA: Diagnosis not present

## 2020-09-14 DIAGNOSIS — H903 Sensorineural hearing loss, bilateral: Secondary | ICD-10-CM | POA: Diagnosis not present

## 2020-09-14 DIAGNOSIS — H6123 Impacted cerumen, bilateral: Secondary | ICD-10-CM | POA: Diagnosis not present

## 2020-09-14 DIAGNOSIS — H838X3 Other specified diseases of inner ear, bilateral: Secondary | ICD-10-CM | POA: Diagnosis not present

## 2020-10-15 DIAGNOSIS — M25572 Pain in left ankle and joints of left foot: Secondary | ICD-10-CM | POA: Diagnosis not present

## 2020-11-01 DIAGNOSIS — Z23 Encounter for immunization: Secondary | ICD-10-CM | POA: Diagnosis not present

## 2020-12-17 ENCOUNTER — Other Ambulatory Visit: Payer: Self-pay | Admitting: Otolaryngology

## 2020-12-17 DIAGNOSIS — R42 Dizziness and giddiness: Secondary | ICD-10-CM | POA: Diagnosis not present

## 2020-12-17 DIAGNOSIS — H838X3 Other specified diseases of inner ear, bilateral: Secondary | ICD-10-CM | POA: Diagnosis not present

## 2020-12-17 DIAGNOSIS — H903 Sensorineural hearing loss, bilateral: Secondary | ICD-10-CM | POA: Diagnosis not present

## 2020-12-17 DIAGNOSIS — H6121 Impacted cerumen, right ear: Secondary | ICD-10-CM | POA: Diagnosis not present

## 2020-12-18 ENCOUNTER — Other Ambulatory Visit: Payer: Self-pay | Admitting: Otolaryngology

## 2020-12-18 DIAGNOSIS — R42 Dizziness and giddiness: Secondary | ICD-10-CM

## 2020-12-18 DIAGNOSIS — H918X9 Other specified hearing loss, unspecified ear: Secondary | ICD-10-CM

## 2020-12-18 DIAGNOSIS — H918X3 Other specified hearing loss, bilateral: Secondary | ICD-10-CM

## 2021-01-06 ENCOUNTER — Other Ambulatory Visit (INDEPENDENT_AMBULATORY_CARE_PROVIDER_SITE_OTHER): Payer: Self-pay | Admitting: Internal Medicine

## 2021-01-07 ENCOUNTER — Other Ambulatory Visit: Payer: Self-pay

## 2021-01-07 ENCOUNTER — Ambulatory Visit
Admission: RE | Admit: 2021-01-07 | Discharge: 2021-01-07 | Disposition: A | Discharge status: Home / Self Care | Payer: Medicare Other | Source: Ambulatory Visit | Attending: Otolaryngology | Admitting: Otolaryngology

## 2021-01-07 DIAGNOSIS — R42 Dizziness and giddiness: Secondary | ICD-10-CM | POA: Diagnosis not present

## 2021-01-07 DIAGNOSIS — G9389 Other specified disorders of brain: Secondary | ICD-10-CM | POA: Diagnosis not present

## 2021-01-07 DIAGNOSIS — J3489 Other specified disorders of nose and nasal sinuses: Secondary | ICD-10-CM | POA: Diagnosis not present

## 2021-01-07 DIAGNOSIS — H918X9 Other specified hearing loss, unspecified ear: Secondary | ICD-10-CM

## 2021-01-07 MED ORDER — GADOBENATE DIMEGLUMINE 529 MG/ML IV SOLN
17.0000 mL | Freq: Once | INTRAVENOUS | Status: AC | PRN
  Administered 2021-01-07: 17 mL via INTRAVENOUS

## 2021-01-09 ENCOUNTER — Telehealth: Payer: Self-pay | Admitting: Cardiovascular Disease

## 2021-01-09 ENCOUNTER — Telehealth: Payer: Self-pay | Admitting: Cardiology

## 2021-01-09 NOTE — Telephone Encounter (Signed)
Patient's wife called stating that patient was having problems with dizziness.  Apparently he has been having some problems in the past with dizziness.  He fells swimmy headed.  BP in right arm 174/50mmHg and left arm 166/9mmHg.  He was seen by ENT for possible vertigo and saw ENT and MRI showed chronic microvascular ischemic changes as well as a focus of encephalomalacia and gliosis in the deep white matter of left frontal lobe noted on prior CT.    He tells me that his dizziness is the room spinning with some nausea and headache.  I have instructed him to go to [facility] ER for further evaluation.

## 2021-01-09 NOTE — Telephone Encounter (Signed)
STAT if patient feels like he/she is going to faint   1) Are you dizzy now? No, but did have dizziness today while at grocery store   2) Do you feel faint or have you passed out? Not right now, but has felt he was going to pass out when dizzy spell occurred previously   3) Do you have any other symptoms? Sweating   4) Have you checked your HR and BP (record if available)? No  Off and on since January. Is requesting an appt with Dr. Angelena Form in regards to this.

## 2021-01-10 NOTE — Telephone Encounter (Signed)
Called patient.  He is not dizzy this morning.  BP is 162/83 - checked prior to talking metoprolol.    Adv to check BP 3-4 hrs after am dose of metoprolol and when he is dizzy.  Bring list of readings to next appointment.  I have scheduled him with B. Bhagat 01/14/21.

## 2021-01-10 NOTE — Telephone Encounter (Signed)
I spoke with Ruben Burton.  Please see other telephone encounter dated 01/09/21 for details.

## 2021-01-10 NOTE — Telephone Encounter (Signed)
Thanks

## 2021-01-11 DIAGNOSIS — H02839 Dermatochalasis of unspecified eye, unspecified eyelid: Secondary | ICD-10-CM | POA: Diagnosis not present

## 2021-01-11 DIAGNOSIS — H18413 Arcus senilis, bilateral: Secondary | ICD-10-CM | POA: Diagnosis not present

## 2021-01-11 DIAGNOSIS — H26492 Other secondary cataract, left eye: Secondary | ICD-10-CM | POA: Diagnosis not present

## 2021-01-11 DIAGNOSIS — Z961 Presence of intraocular lens: Secondary | ICD-10-CM | POA: Diagnosis not present

## 2021-01-11 DIAGNOSIS — H26493 Other secondary cataract, bilateral: Secondary | ICD-10-CM | POA: Diagnosis not present

## 2021-01-13 NOTE — Progress Notes (Unsigned)
Cardiology Office Note:    Date:  01/14/2021   ID:  Ruben Burton, DOB 1940-12-18, MRN [id number]  PCP:  Ginger Organ., MD  [facility] Cardiologist:  [name], MD  [facility] Electrophysiologist:  None   Chief Complaint: Dizziness and elevated blood pressure  History of Present Illness:    Ruben Burton is a 80 y.o. male with a hx of CAD s/p CABG in 2011, postop atrial fibrillation, hypertension and hyperlipidemia seen for dizziness and elevated blood pressure.  He was admitted in April 2011 with a NSTEMI and was found to have severe disease in the LAD and Diagonal. He underwent 2V CABG with LIMA to LAD and SVG to Diagonal. Postop course was complicated by atrial fibrillation. Echo 2011 with LVEF 55-60%, mildly dilated RV. No evidence of atrial fib on post-op cardiac monitor in 2011. Coumadin was discontinued. Stress myoview February 2016 with no ischemia. PVCs noted on EKG during stress. [facility] with chest pain. Cardiac cath 10/16/16 with stable CAD (occluded mid LAD and Diagonal with patent LIMA to LAD and patent SVG to Diagonal). The RCA had mild to moderate plaque. The Circumflex have no obstructive disease. PVCs noted on monitor during hospitalization. Echo December 2018 with normal LV systolic function, grade 2 diastolic dysfunction, no valve disease.  Last seen by Dr. Angelena Form 06/2020.   Patient with history of dizziness for past 6 to 8 weeks.  This been intermittent.  Not positional.  His dizziness is associated with nausea and headache.  Patient has prior history of vertigo and this episode feels similar.  Has not evaluated by PCP.  Patient has prior history of asymmetrical hearing loss and seen by ENT who will order MRI of brain which showed no acute intracranial abnormality.  Denies associated palpitation, skipped beat, chest pain, shortness of breath or syncope.  No orthopnea, PND, lower extremity edema or melena.   MRI of brain  01/07/21 IMPRESSION: 1. No acute intracranial abnormality. 2. No cerebellopontine angle mass or internal auditory canal lesion is demonstrated. 3. A few scattered foci of T2 hyperintensity are seen within the white matter of the cerebral hemispheres, nonspecific but may represent mild chronic microvascular ischemic changes. 4. Focus of encephalomalacia and gliosis in the deep white matter of the left frontal lobe which was present on prior CT.     Past Medical History:  Diagnosis Date  . Arthritis    "mild; fingers" (10/16/2016)  . CAD, ARTERY BYPASS GRAFT 03/07/2010   Qualifier: Diagnosis of  By: Orville Govern CMA, Carol    . Chest pain 10/16/2016  . Coronary artery disease    a. CABG in 2011 b. Cath 10/16/16 - chronic total occlusion of mid LAD with patietn LIMA to mid LAD; severe ostial stenosis to large diagonal with patent SVG to diagonal; moderate non obstrctive RCA, normal LV function. Tx Rx  . GERD 03/07/2010   Qualifier: Diagnosis of  By: Ronne Binning    . GERD (gastroesophageal reflux disease)   . Hx of cardiovascular stress test    a. ETT-MV 1/14: no ischemia; not gated  . Hyperlipidemia   . HYPERLIPIDEMIA 03/07/2010   Qualifier: Diagnosis of  By: Ronne Binning    . Hypertension   . HYPERTENSION 03/07/2010   Qualifier: Diagnosis of  By: Ronne Binning    . PAROXYSMAL ATRIAL FIBRILLATION 03/07/2010   Qualifier: Diagnosis of  By: Orville Govern CMA, Carol    . Paroxysmal atrial fibrillation (HCC)   .  Pleural effusion   . PLEURAL EFFUSION, LEFT 03/07/2010   Qualifier: Diagnosis of  By: Orville Govern CMA, Carol    . Precordial pain   . Unstable angina ([place]) 10/16/2016  . Unstable angina pectoris ([place]) 10/16/2016    Past Surgical History:  Procedure Laterality Date  . BIOPSY  09/16/2019   Procedure: BIOPSY;  Surgeon: Rogene Houston, MD;  [facility];  Service: Endoscopy;;  gastric   . CARDIAC CATHETERIZATION  ~ 2011; 10/16/2016  . CARDIAC CATHETERIZATION N/A 10/16/2016    Procedure: Left Heart Cath and Cors/Grafts Angiography;  Surgeon: Burnell Blanks, MD;  Location: [facility];  Service: Cardiovascular;  Laterality: N/A;  . CHOLECYSTECTOMY OPEN  ~ 1992  . COLONOSCOPY N/A 12/01/2013   Procedure: COLONOSCOPY;  Surgeon: Rogene Houston, MD;  [facility];  Service: Endoscopy;  Laterality: N/A;  830  . COLONOSCOPY WITH PROPOFOL N/A 09/16/2019   Procedure: COLONOSCOPY WITH PROPOFOL;  Surgeon: Rogene Houston, MD;  [facility];  Service: Endoscopy;  Laterality: N/A;  . CORONARY ARTERY BYPASS GRAFT  02-13-10   2V (LIMA to LAD, SVG to diagonal)  . ESOPHAGOGASTRODUODENOSCOPY (EGD) WITH ESOPHAGEAL DILATION    . ESOPHAGOGASTRODUODENOSCOPY (EGD) WITH PROPOFOL N/A 09/16/2019   Procedure: ESOPHAGOGASTRODUODENOSCOPY (EGD) WITH PROPOFOL;  Surgeon: Rogene Houston, MD;  [facility];  Service: Endoscopy;  Laterality: N/A;  955am  . EXPLORATORY LAPAROTOMY  ~ 1995   for perforated duodenal ulcer  . HYDROCELE EXCISION    . POLYPECTOMY  09/16/2019   Procedure: POLYPECTOMY;  Surgeon: Rogene Houston, MD;  [facility];  Service: Endoscopy;;  colon    Current Medications: Current Meds  Medication Sig  . aspirin EC 81 MG tablet Take 1 tablet (81 mg total) by mouth every evening.  . B Complex-C (B-COMPLEX WITH VITAMIN C) tablet Take 1 tablet by mouth daily after breakfast.  . calcium carbonate (OS-CAL) 1250 (500 Ca) MG chewable tablet Chew 1 tablet by mouth 2 (two) times daily.   Marland Kitchen esomeprazole (NEXIUM) 40 MG capsule TAKE 1 CAPSULE (40 MG TOTAL) BY MOUTH 2 (TWO) TIMES DAILY BEFORE A MEAL.  . metoprolol (LOPRESSOR) 25 MG tablet Take one tablet by mouth twice daily. (Patient taking differently: Take 25 mg by mouth 2 (two) times daily.)  . mometasone (ELOCON) 0.1 % cream Apply 1 application topically daily as needed (ear itching.).   Marland Kitchen nitroGLYCERIN (NITROSTAT) 0.4 MG SL tablet Place 1 tablet (0.4 mg total) under the  tongue every 5 (five) minutes as needed for chest pain. (Patient taking differently: Place 0.4 mg under the tongue every 5 (five) minutes x 3 doses as needed for chest pain.)  . rosuvastatin (CRESTOR) 20 MG tablet TAKE 1 TABLET DAILY (Patient taking differently: Take 20 mg by mouth every evening.)  . tamsulosin (FLOMAX) 0.4 MG CAPS capsule Take 0.4 mg by mouth daily.      Allergies:   Omeprazole, Pantoprazole, and Oxytetracycline   Social History   Socioeconomic History  . Marital status: Married    Spouse name: Not on file  . Number of children: 2  . Years of education: Not on file  . Highest education level: Not on file  Occupational History  . Occupation: retirede    Fish farm manager: RETIRED  Tobacco Use  . Smoking status: Former Smoker    Packs/day: 0.25    Years: 1.00    Pack years: 0.25    Types: Cigarettes    Quit  date: 58    Years since quitting: 62.2  . Smokeless tobacco: Never Used  Vaping Use  . Vaping Use: Never used  Substance and Sexual Activity  . Alcohol use: No  . Drug use: No  . Sexual activity: Yes  Other Topics Concern  . Not on file  Social History Narrative  . Not on file   Social Determinants of Health   Financial Resource Strain: Not on file  Food Insecurity: Not on file  Transportation Needs: Not on file  Physical Activity: Not on file  Stress: Not on file  Social Connections: Not on file     Family History: The patient's family history includes Heart attack in his brother; Hypertension in his brother, father, and mother; Other in his brother and mother; Stroke in his father. There is no history of Colon cancer.    ROS:   Please see the history of present illness.    All other systems reviewed and are negative.  EKGs/Labs/Other Studies Reviewed:    The following studies were reviewed today: Echo 10/2017 Study Conclusions   - Left ventricle: The cavity size was normal. Wall thickness was  normal. Systolic function was normal. The  estimated ejection  fraction was in the range of 60% to 65%. Wall motion was normal;  there were no regional wall motion abnormalities. Features are  consistent with a pseudonormal left ventricular filling pattern,  with concomitant abnormal relaxation and increased filling  pressure (grade 2 diastolic dysfunction).   Left Heart Cath and Cors/Grafts Angiography    Conclusion    The left ventricular ejection fraction is 50-55% by visual estimate.  The left ventricular systolic function is normal.  LV end diastolic pressure is normal.  There is no mitral valve regurgitation.  Ost 1st Diag lesion, 99 %stenosed.  SVG graft was visualized by angiography.  LIMA graft was visualized by angiography.  Mid LAD lesion, 100 %stenosed.  Ost RCA to Prox RCA lesion, 40 %stenosed.  Prox RCA to Mid RCA lesion, 30 %stenosed.  Dist RCA lesion, 20 %stenosed.   1. 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 Patent LIMA graft to the mid LAD. The large Diagonal branch has severe ostial stenosis. The vein graft is patent and fills the Diagonal.  2. Moderate non-obstructive disease in the RCA 3. No disease noted in the Circumflex.  4. Normal LV systolic function  Recommendations: Continue medical management of CAD Diagnostic Dominance: Right    Intervention        EKG:  EKG is not ordered today.   Recent Labs: No results found for requested labs within last 8760 hours.  Recent Lipid Panel    Component Value Date/Time   CHOL 116 05/14/2010 0908   TRIG 141.0 05/14/2010 0908   HDL 32.80 (L) 05/14/2010 0908   CHOLHDL 4 05/14/2010 0908   VLDL 28.2 05/14/2010 0908   LDLCALC 55 05/14/2010 0908   Physical Exam:    VS:  BP 130/86   Pulse 68   Ht 6\' 1"  (1.854 m)   Wt 198 lb 12.8 oz (90.2 kg)   SpO2 97%   BMI 26.23 kg/m     Wt Readings from Last 3 Encounters:  01/14/21 198 lb 12.8 oz (90.2 kg)  06/18/20 195 lb 9.6 oz (88.7 kg)  07/28/19  194 lb 9.6 oz (88.3 kg)     GEN: Well nourished, well developed in no acute distress HEENT: Normal NECK: No JVD; No carotid bruits LYMPHATICS: No lymphadenopathy  CARDIAC: RRR, no murmurs, rubs, gallops RESPIRATORY:  Clear to auscultation without rales, wheezing or rhonchi  ABDOMEN: Soft, non-tender, non-distended MUSCULOSKELETAL:  No edema; No deformity  SKIN: Warm and dry NEUROLOGIC:  Alert and oriented x 3 PSYCHIATRIC:  Normal affect   ASSESSMENT AND PLAN:    1. CAD s/p CABG in 2011 -No angina.  Continue aspirin, statin and beta-blocker  2. HTN -Blood pressure stable and controlled on current medication.  3. HLD -No results found for requested labs within last 8760 hours.  -Continue statin  4.  Dizziness Highly suspect episodes due to vertigo.  Symptoms similar when had vertigo previously.  Heart rate and blood pressure stable at home.  I have advised to check vital during this episode.  Work-up with PCP recommended.  If still ongoing problem, he will give [place] a call.  Medication Adjustments/Labs and Tests Ordered: Current medicines are reviewed at length with the patient today.  Concerns regarding medicines are outlined above.  No orders of the defined types were placed in this encounter.  No orders of the defined types were placed in this encounter.   Patient Instructions  Medication Instructions:  Your physician recommends that you continue on your current medications as directed. Please refer to the Current Medication list given to you today. *If you need a refill on your cardiac medications before your next appointment, please call your pharmacy*   Lab Work: None If you have labs (blood work) drawn today and your tests are completely normal, you will receive your results only by: Marland Kitchen MyChart Message (if you have MyChart) OR . A paper copy in the mail If you have any lab test that is abnormal or we need to change your treatment, we will call you to review the  results.   Testing/Procedures: None   Follow-Up: At [facility], you and your health needs are our priority.  As part of our continuing mission to provide you with exceptional heart care, we have created designated Provider Care Teams.  These Care Teams include your primary Cardiologist (physician) and Advanced Practice Providers (APPs -  Physician Assistants and Nurse Practitioners) who all work together to provide you with the care you need, when you need it.   Your next appointment:   2 month(s) on 05/02/21 at 4:00 PM  The format for your next appointment:   In Person  Provider:   [name], MD        Signed, Leanor Kail, [place]  01/14/2021 1:16 PM    [place]

## 2021-01-14 ENCOUNTER — Other Ambulatory Visit: Payer: Self-pay

## 2021-01-14 ENCOUNTER — Ambulatory Visit (INDEPENDENT_AMBULATORY_CARE_PROVIDER_SITE_OTHER): Payer: Medicare Other | Admitting: Physician Assistant

## 2021-01-14 ENCOUNTER — Encounter: Payer: Self-pay | Admitting: Physician Assistant

## 2021-01-14 VITALS — BP 130/86 | HR 68 | Ht 73.0 in | Wt 198.8 lb

## 2021-01-14 DIAGNOSIS — I10 Essential (primary) hypertension: Secondary | ICD-10-CM | POA: Diagnosis not present

## 2021-01-14 DIAGNOSIS — E7800 Pure hypercholesterolemia, unspecified: Secondary | ICD-10-CM

## 2021-01-14 DIAGNOSIS — I25810 Atherosclerosis of coronary artery bypass graft(s) without angina pectoris: Secondary | ICD-10-CM

## 2021-01-14 DIAGNOSIS — R42 Dizziness and giddiness: Secondary | ICD-10-CM | POA: Diagnosis not present

## 2021-01-14 NOTE — Patient Instructions (Addendum)
Medication Instructions:  Your physician recommends that you continue on your current medications as directed. Please refer to the Current Medication list given to you today. *If you need a refill on your cardiac medications before your next appointment, please call your pharmacy*   Lab Work: None If you have labs (blood work) drawn today and your tests are completely normal, you will receive your results only by: Marland Kitchen MyChart Message (if you have MyChart) OR . A paper copy in the mail If you have any lab test that is abnormal or we need to change your treatment, we will call you to review the results.   Testing/Procedures: None   Follow-Up: At [facility], you and your health needs are our priority.  As part of our continuing mission to provide you with exceptional heart care, we have created designated Provider Care Teams.  These Care Teams include your primary Cardiologist (physician) and Advanced Practice Providers (APPs -  Physician Assistants and Nurse Practitioners) who all work together to provide you with the care you need, when you need it.   Your next appointment:   2 month(s) on 05/02/21 at 4:00 PM  The format for your next appointment:   In Person  Provider:   [name], MD

## 2021-01-16 ENCOUNTER — Telehealth: Payer: Self-pay | Admitting: Physician Assistant

## 2021-01-16 DIAGNOSIS — R42 Dizziness and giddiness: Secondary | ICD-10-CM

## 2021-01-16 NOTE — Telephone Encounter (Signed)
Spoke with pt and pt's wife. She is aware that we will put the order in for the [place] Carotid US and that pt also needs to see his PCP as well.  She didn't record the HR only the blood pressure.

## 2021-01-16 NOTE — Telephone Encounter (Signed)
Get Carotid doppler. He need to follow up with PCP to r/o Vertigo. What's his HR with these episodes?

## 2021-01-16 NOTE — Telephone Encounter (Signed)
   Pt's wife calling, she said when pt had an OV with Vin last Monday, they were told if pt have another dizziness episode to let him know so he can order a test to check the blood flow in his neck. Ruben Burton is calling to request if Cephus Shelling can make that order so pt can get the test. Pt had a dizziness episode today with a BP of 153/89

## 2021-01-22 ENCOUNTER — Other Ambulatory Visit: Payer: Self-pay

## 2021-01-22 ENCOUNTER — Ambulatory Visit (HOSPITAL_COMMUNITY)
Admission: RE | Admit: 2021-01-22 | Discharge: 2021-01-22 | Disposition: A | Discharge status: Home / Self Care | Payer: Medicare Other | Source: Ambulatory Visit | Attending: Cardiovascular Disease | Admitting: Cardiovascular Disease

## 2021-01-22 DIAGNOSIS — R42 Dizziness and giddiness: Secondary | ICD-10-CM | POA: Insufficient documentation

## 2021-01-25 DIAGNOSIS — H26491 Other secondary cataract, right eye: Secondary | ICD-10-CM | POA: Diagnosis not present

## 2021-01-28 ENCOUNTER — Encounter (HOSPITAL_COMMUNITY): Payer: Medicare Other

## 2021-01-28 DIAGNOSIS — D696 Thrombocytopenia, unspecified: Secondary | ICD-10-CM | POA: Diagnosis not present

## 2021-01-28 DIAGNOSIS — E039 Hypothyroidism, unspecified: Secondary | ICD-10-CM | POA: Diagnosis not present

## 2021-01-28 DIAGNOSIS — I25810 Atherosclerosis of coronary artery bypass graft(s) without angina pectoris: Secondary | ICD-10-CM | POA: Diagnosis not present

## 2021-01-28 DIAGNOSIS — I10 Essential (primary) hypertension: Secondary | ICD-10-CM | POA: Diagnosis not present

## 2021-01-28 DIAGNOSIS — L97521 Non-pressure chronic ulcer of other part of left foot limited to breakdown of skin: Secondary | ICD-10-CM | POA: Diagnosis not present

## 2021-01-28 DIAGNOSIS — E538 Deficiency of other specified B group vitamins: Secondary | ICD-10-CM | POA: Diagnosis not present

## 2021-01-28 DIAGNOSIS — R42 Dizziness and giddiness: Secondary | ICD-10-CM | POA: Diagnosis not present

## 2021-02-05 ENCOUNTER — Ambulatory Visit (INDEPENDENT_AMBULATORY_CARE_PROVIDER_SITE_OTHER): Payer: Medicare Other | Admitting: Podiatry

## 2021-02-05 ENCOUNTER — Ambulatory Visit (INDEPENDENT_AMBULATORY_CARE_PROVIDER_SITE_OTHER): Payer: Medicare Other

## 2021-02-05 ENCOUNTER — Other Ambulatory Visit: Payer: Self-pay

## 2021-02-05 DIAGNOSIS — L97522 Non-pressure chronic ulcer of other part of left foot with fat layer exposed: Secondary | ICD-10-CM

## 2021-02-05 DIAGNOSIS — M79675 Pain in left toe(s): Secondary | ICD-10-CM | POA: Diagnosis not present

## 2021-02-05 DIAGNOSIS — M2042 Other hammer toe(s) (acquired), left foot: Secondary | ICD-10-CM

## 2021-02-28 DIAGNOSIS — L820 Inflamed seborrheic keratosis: Secondary | ICD-10-CM | POA: Diagnosis not present

## 2021-02-28 DIAGNOSIS — L298 Other pruritus: Secondary | ICD-10-CM | POA: Diagnosis not present

## 2021-02-28 DIAGNOSIS — L84 Corns and callosities: Secondary | ICD-10-CM | POA: Diagnosis not present

## 2021-03-02 DIAGNOSIS — I10 Essential (primary) hypertension: Secondary | ICD-10-CM | POA: Diagnosis not present

## 2021-03-02 DIAGNOSIS — I25810 Atherosclerosis of coronary artery bypass graft(s) without angina pectoris: Secondary | ICD-10-CM | POA: Diagnosis not present

## 2021-03-02 DIAGNOSIS — E039 Hypothyroidism, unspecified: Secondary | ICD-10-CM | POA: Diagnosis not present

## 2021-03-02 DIAGNOSIS — E785 Hyperlipidemia, unspecified: Secondary | ICD-10-CM | POA: Diagnosis not present

## 2021-03-05 ENCOUNTER — Ambulatory Visit: Payer: Medicare Other | Admitting: Podiatry

## 2021-03-07 NOTE — Progress Notes (Signed)
  Subjective:  Patient ID: Ruben Burton, male    DOB: 1941-11-02,  MRN: [id number]  No chief complaint on file.   80 y.o. male presents with the above complaint. History confirmed with patient.  Reports pain to left fifth toe present for 2 months reports soreness and redness no drainage warmth noted.  Diabetic last A1c of 5.7.   Objective:  Physical Exam: warm, good capillary refill, no trophic changes or ulcerative lesions, normal DP and PT pulses and normal sensory exam. Digital contractures with 5th toe medial PIPJ callus. No open ulceration.  No images are attached to the encounter.  Radiographs: X-ray of the left foot: no fracture, dislocation, swelling or degenerative changes noted and digital contractures Assessment:   1. Hammertoe of left foot   2. Pain in toe of left foot     Plan:  Patient was evaluated and treated and all questions answered.  Hammertoe -XR reviewed with patient -Educated on etiology of deformity -Discussed padding and shoe gear changes -Dispensed toe spacers  Return in about 4 weeks (around 03/05/2021) for hammertoe f/u.

## 2021-03-15 DIAGNOSIS — H903 Sensorineural hearing loss, bilateral: Secondary | ICD-10-CM | POA: Diagnosis not present

## 2021-03-15 DIAGNOSIS — R42 Dizziness and giddiness: Secondary | ICD-10-CM | POA: Diagnosis not present

## 2021-03-15 DIAGNOSIS — H838X3 Other specified diseases of inner ear, bilateral: Secondary | ICD-10-CM | POA: Diagnosis not present

## 2021-03-15 DIAGNOSIS — H6121 Impacted cerumen, right ear: Secondary | ICD-10-CM | POA: Diagnosis not present

## 2021-04-02 DIAGNOSIS — E039 Hypothyroidism, unspecified: Secondary | ICD-10-CM | POA: Diagnosis not present

## 2021-04-02 DIAGNOSIS — I10 Essential (primary) hypertension: Secondary | ICD-10-CM | POA: Diagnosis not present

## 2021-04-02 DIAGNOSIS — E785 Hyperlipidemia, unspecified: Secondary | ICD-10-CM | POA: Diagnosis not present

## 2021-04-02 DIAGNOSIS — I25810 Atherosclerosis of coronary artery bypass graft(s) without angina pectoris: Secondary | ICD-10-CM | POA: Diagnosis not present

## 2021-04-25 ENCOUNTER — Other Ambulatory Visit (INDEPENDENT_AMBULATORY_CARE_PROVIDER_SITE_OTHER): Payer: Self-pay | Admitting: Gastroenterology

## 2021-04-25 ENCOUNTER — Telehealth (INDEPENDENT_AMBULATORY_CARE_PROVIDER_SITE_OTHER): Payer: Self-pay

## 2021-04-25 NOTE — Telephone Encounter (Signed)
Per Dr. Laural Golden patient needs an appointment with either Doctor as he has not been seen since November 2020.

## 2021-04-29 DIAGNOSIS — N400 Enlarged prostate without lower urinary tract symptoms: Secondary | ICD-10-CM | POA: Diagnosis not present

## 2021-05-02 ENCOUNTER — Encounter: Payer: Self-pay | Admitting: Cardiovascular Disease

## 2021-05-02 ENCOUNTER — Ambulatory Visit (INDEPENDENT_AMBULATORY_CARE_PROVIDER_SITE_OTHER): Payer: Medicare Other | Admitting: Cardiovascular Disease

## 2021-05-02 ENCOUNTER — Other Ambulatory Visit: Payer: Self-pay

## 2021-05-02 VITALS — BP 132/88 | HR 71 | Ht 72.0 in | Wt 197.0 lb

## 2021-05-02 DIAGNOSIS — E039 Hypothyroidism, unspecified: Secondary | ICD-10-CM | POA: Diagnosis not present

## 2021-05-02 DIAGNOSIS — I25810 Atherosclerosis of coronary artery bypass graft(s) without angina pectoris: Secondary | ICD-10-CM

## 2021-05-02 DIAGNOSIS — I480 Paroxysmal atrial fibrillation: Secondary | ICD-10-CM | POA: Diagnosis not present

## 2021-05-02 DIAGNOSIS — I10 Essential (primary) hypertension: Secondary | ICD-10-CM | POA: Diagnosis not present

## 2021-05-02 DIAGNOSIS — E7800 Pure hypercholesterolemia, unspecified: Secondary | ICD-10-CM

## 2021-05-02 DIAGNOSIS — E785 Hyperlipidemia, unspecified: Secondary | ICD-10-CM | POA: Diagnosis not present

## 2021-05-02 NOTE — Progress Notes (Signed)
Chief Complaint  Patient presents with   Follow-up    CAD   History of Present Illness: 80 yo male with history of CAD s/p CABG in 2011, HTN, HLD and post-op atrial fibrillation here today for follow up.  He was admitted in April 2011 with a NSTEMI and was found to have severe disease in the LAD and Diagonal. He underwent 2V CABG with LIMA to LAD and SVG to Diagonal. Postop course was complicated by atrial fibrillation. Echo 2011 with LVEF 55-60%, mildly dilated RV. No evidence of atrial fib on post-op cardiac monitor in 2011.  Coumadin was discontinued. Stress myoview February 2016 with no ischemia. PVCs noted on EKG during stress. Admitted to [facility] December 2017 with chest pain. Cardiac cath 10/16/16 with stable CAD (occluded mid LAD and Diagonal with patent LIMA to LAD and patent SVG to Diagonal). The RCA had mild to moderate plaque. The Circumflex have no obstructive disease. PVCs noted on monitor during hospitalization. Echo December 2018 with normal LV systolic function, grade 2 diastolic dysfunction, no valve disease. He was seen in our office 01/14/21 by Robbie Lis, PA-C with c/o dizziness. It was felt to be related to vertigo. Carotid artery dopplers with no significant carotid artery disease. Possible disease in the right vertebral artery.   He is here today for follow up. The patient denies any chest pain, dyspnea, palpitations, lower extremity edema, orthopnea, PND, dizziness, near syncope or syncope.   Primary Care Physician: Ginger Organ., MD  Past Medical History:  Diagnosis Date   Arthritis    "mild; fingers" (10/16/2016)   CAD, ARTERY BYPASS GRAFT 03/07/2010   Qualifier: Diagnosis of  By: Orville Govern CMA, Carol     Chest pain 10/16/2016   Coronary artery disease    a. CABG in 2011 b. Cath 10/16/16 - chronic total occlusion of mid LAD with patietn LIMA to mid LAD; severe ostial stenosis to large diagonal with patent SVG to diagonal; moderate non obstrctive RCA, normal LV  function. Tx Rx   GERD 03/07/2010   Qualifier: Diagnosis of  By: Orville Govern, CMA, Carol     GERD (gastroesophageal reflux disease)    Hx of cardiovascular stress test    a. ETT-MV 1/14: no ischemia; not gated   Hyperlipidemia    HYPERLIPIDEMIA 03/07/2010   Qualifier: Diagnosis of  By: Orville Govern CMA, Carol     Hypertension    HYPERTENSION 03/07/2010   Qualifier: Diagnosis of  By: Orville Govern CMA, [place]     PAROXYSMAL ATRIAL FIBRILLATION 03/07/2010   Qualifier: Diagnosis of  By: Orville Govern CMA, Carol     Paroxysmal atrial fibrillation ([place])    Pleural effusion    PLEURAL EFFUSION, LEFT 03/07/2010   Qualifier: Diagnosis of  By: Orville Govern, CMA, Carol     Precordial pain    Unstable angina ([place]) 10/16/2016   Unstable angina pectoris ([place]) 10/16/2016    Past Surgical History:  Procedure Laterality Date   BIOPSY  09/16/2019   Procedure: BIOPSY;  Surgeon: Rogene Houston, MD;  [facility];  Service: Endoscopy;;  gastric    CARDIAC CATHETERIZATION  ~ 2011; 10/16/2016   CARDIAC CATHETERIZATION N/A 10/16/2016   Procedure: Left Heart Cath and Cors/Grafts Angiography;  Surgeon: Burnell Blanks, MD;  Location: [place] [facility];  Service: Cardiovascular;  Laterality: N/A;   CHOLECYSTECTOMY OPEN  ~ 1992   COLONOSCOPY N/A 12/01/2013   Procedure: COLONOSCOPY;  Surgeon: Rogene Houston, MD;  [facility];  Service: Endoscopy;  Laterality: N/A;  830   COLONOSCOPY WITH PROPOFOL N/A 09/16/2019   Procedure: COLONOSCOPY WITH PROPOFOL;  Surgeon: Rogene Houston, MD;  [facility];  Service: Endoscopy;  Laterality: N/A;   CORONARY ARTERY BYPASS GRAFT  02-13-10   2V (LIMA to LAD, SVG to diagonal)   ESOPHAGOGASTRODUODENOSCOPY (EGD) WITH ESOPHAGEAL DILATION     ESOPHAGOGASTRODUODENOSCOPY (EGD) WITH PROPOFOL N/A 09/16/2019   Procedure: ESOPHAGOGASTRODUODENOSCOPY (EGD) WITH PROPOFOL;  Surgeon: Rogene Houston, MD;  [facility];  Service: Endoscopy;  Laterality: N/A;  955am    EXPLORATORY LAPAROTOMY  ~ 1995   for perforated duodenal ulcer   HYDROCELE EXCISION     POLYPECTOMY  09/16/2019   Procedure: POLYPECTOMY;  Surgeon: Rogene Houston, MD;  [facility];  Service: Endoscopy;;  colon    Current Outpatient Medications  Medication Sig Dispense Refill   aspirin EC 81 MG tablet Take 1 tablet (81 mg total) by mouth every evening.     B Complex-C (B-COMPLEX WITH VITAMIN C) tablet Take 1 tablet by mouth daily after breakfast.     calcium carbonate (OS-CAL) 1250 (500 Ca) MG chewable tablet Chew 1 tablet by mouth 2 (two) times daily.      esomeprazole (NEXIUM) 40 MG capsule Take 40 mg by mouth daily at 12 noon.     metoprolol (LOPRESSOR) 25 MG tablet Take one tablet by mouth twice daily. (Patient taking differently: Take 25 mg by mouth 2 (two) times daily.) 180 tablet 3   mometasone (ELOCON) 0.1 % cream Apply 1 application topically daily as needed (ear itching.).      nitroGLYCERIN (NITROSTAT) 0.4 MG SL tablet Place 1 tablet (0.4 mg total) under the tongue every 5 (five) minutes as needed for chest pain. (Patient taking differently: Place 0.4 mg under the tongue every 5 (five) minutes x 3 doses as needed for chest pain.) 25 tablet 11   rosuvastatin (CRESTOR) 20 MG tablet TAKE 1 TABLET DAILY (Patient taking differently: Take 20 mg by mouth every evening.) 90 tablet 2   tamsulosin (FLOMAX) 0.4 MG CAPS capsule Take 0.4 mg by mouth daily.      No current facility-administered medications for this visit.    Allergies  Allergen Reactions   Omeprazole Nausea Only   Pantoprazole Nausea Only   Oxytetracycline Other (See Comments)    Passes out.    Social History   Socioeconomic History   Marital status: Married    Spouse name: Not on file   Number of children: 2   Years of education: Not on file   Highest education level: Not on file  Occupational History   Occupation: retirede    Employer: RETIRED  Tobacco Use   Smoking status: Former    Packs/day:  0.25    Years: 1.00    Pack years: 0.25    Types: Cigarettes    Quit date: 1960    Years since quitting: 62.5   Smokeless tobacco: Never  Vaping Use   Vaping Use: Never used  Substance and Sexual Activity   Alcohol use: No   Drug use: No   Sexual activity: Yes  Other Topics Concern   Not on file  Social History Narrative   Not on file   Social Determinants of Health   Financial Resource Strain: Not on file  Food Insecurity: Not on file  Transportation Needs: Not on file  Physical Activity: Not on file  Stress: Not on file  Social Connections: Not on file  Intimate Partner Violence: Not on file    Family History  Problem Relation Age of Onset   Other Mother        alive- healthy   Hypertension Mother    Stroke Father        deceased   Hypertension Father    Other Brother        CABG   Heart attack Brother    Hypertension Brother    Colon cancer Neg Hx     Review of Systems:  As stated in the HPI and otherwise negative.   BP 132/88   Pulse 71   Ht 6' (1.829 m)   Wt 197 lb (89.4 kg)   SpO2 98%   BMI 26.72 kg/m   Physical Examination:  General: Well developed, well nourished, NAD  HEENT: OP clear, mucus membranes moist  SKIN: warm, dry. No rashes. Neuro: No focal deficits  Musculoskeletal: Muscle strength 5/5 all ext  Psychiatric: Mood and affect normal  Neck: No JVD, no carotid bruits, no thyromegaly, no lymphadenopathy.  Lungs:Clear bilaterally, no wheezes, rhonci, crackles Cardiovascular: Regular rate and rhythm. No murmurs, gallops or rubs. Abdomen:Soft. Bowel sounds present. Non-tender.  Extremities: No lower extremity edema. Pulses are 2 + in the bilateral DP/PT.  Cardiac cath December 2017: Diagnostic Diagram        Echo 10/19/17: Left ventricle: The cavity size was normal. Wall thickness was   normal. Systolic function was normal. The estimated ejection   fraction was in the range of 60% to 65%. Wall motion was normal;   there were no  regional wall motion abnormalities. Features are   consistent with a pseudonormal left ventricular filling pattern,   with concomitant abnormal relaxation and increased filling   pressure (grade 2 diastolic dysfunction).  EKG:  EKG is ordered today. The ekg ordered today demonstrates Sinus  Recent Labs: No results found for requested labs within last 8760 hours.   Lipids: followed in primary care:   Wt Readings from Last 3 Encounters:  05/02/21 197 lb (89.4 kg)  01/14/21 198 lb 12.8 oz (90.2 kg)  06/18/20 195 lb 9.6 oz (88.7 kg)     Other studies Reviewed: Additional studies/ records that were reviewed today include:. Review of the above records demonstrates:   Assessment and Plan:   1. Coronary Artery Disease without angina: He is s/p CABG in 2011. Cardiac cath in December 2017 with stable CAD with patent bypass grafts. Echo December 2017 with normal LV systolic function. He has no chest pain. Will continue ASA, statin and beta blocker.   2. Hypertension: BP is controlled. Continue current therapy  3. Hyperlipidemia: Lipids managed in primary care. Will continue statin.   4. Paroxysmal atrial fibrillation: He has had no known recurrences of atrial fibrillation since his post-op period in 2011. Continue Lopressor.   Current medicines are reviewed at length with the patient today.  The patient does not have concerns regarding medicines.  The following changes have been made:  no change  Labs/ tests ordered today include:   Orders Placed This Encounter  Procedures   EKG 12-Lead     Disposition:   F/U with me in 12 months  Signed, [name], MD 05/02/2021 4:37 PM    [facility] [address], [place], [place]  [address] Phone: [phone]; Fax: [phone]

## 2021-05-02 NOTE — Patient Instructions (Signed)
Medication Instructions:  Your physician recommends that you continue on your current medications as directed. Please refer to the Current Medication list given to you today.  *If you need a refill on your cardiac medications before your next appointment, please call your pharmacy*  Lab Work: None Ordered If you have labs (blood work) drawn today and your tests are completely normal, you will receive your results only by: . MyChart Message (if you have MyChart) OR . A paper copy in the mail If you have any lab test that is abnormal or we need to change your treatment, we will call you to review the results.   Testing/Procedures: None Ordered   Follow-Up: At [facility], you and your health needs are our priority.  As part of our continuing mission to provide you with exceptional heart care, we have created designated Provider Care Teams.  These Care Teams include your primary Cardiologist (physician) and Advanced Practice Providers (APPs -  Physician Assistants and Nurse Practitioners) who all work together to provide you with the care you need, when you need it.  Your next appointment:   1 year(s)  The format for your next appointment:   In Person  Provider:   You may see Christopher McAlhany, MD or one of the following Advanced Practice Providers on your designated Care Team:    Dayna Dunn, PA-C  Michele Lenze, PA-C    

## 2021-05-13 ENCOUNTER — Telehealth: Payer: Self-pay | Admitting: Cardiovascular Disease

## 2021-05-13 NOTE — Telephone Encounter (Signed)
STAT if patient feels like he/she is going to faint   Are you dizzy now? Yes   Do you feel faint or have you passed out? More swimming headed  Do you have any other symptoms? No   Have you checked your HR and BP (record if available)? Did not write it down

## 2021-05-13 NOTE — Telephone Encounter (Signed)
Dizzy spell - woke yesterday am real swimmy headed, lasted all day and still present today.    The last time the dizzy episode lasted 3 days and was relieved spontaneously.  We asked him to follow up with his primary care physician for this but he has not yet.  Pt denies any blurry vision, sob, or chest pain He was feeling his normal self until this dizzy episode.   The other day he felt a little tingling in left arm.it only lasted seconds.      I asked him to call his PCP office to let them know and see if they would want to see him for his dizziness.   He is aware I will call him back if Dr. Angelena Form has other recommendations.

## 2021-05-21 DIAGNOSIS — D225 Melanocytic nevi of trunk: Secondary | ICD-10-CM | POA: Diagnosis not present

## 2021-05-21 DIAGNOSIS — Z85828 Personal history of other malignant neoplasm of skin: Secondary | ICD-10-CM | POA: Diagnosis not present

## 2021-05-21 DIAGNOSIS — L814 Other melanin hyperpigmentation: Secondary | ICD-10-CM | POA: Diagnosis not present

## 2021-05-21 DIAGNOSIS — L821 Other seborrheic keratosis: Secondary | ICD-10-CM | POA: Diagnosis not present

## 2021-05-21 DIAGNOSIS — L820 Inflamed seborrheic keratosis: Secondary | ICD-10-CM | POA: Diagnosis not present

## 2021-05-21 DIAGNOSIS — L905 Scar conditions and fibrosis of skin: Secondary | ICD-10-CM | POA: Diagnosis not present

## 2021-06-02 DIAGNOSIS — E785 Hyperlipidemia, unspecified: Secondary | ICD-10-CM | POA: Diagnosis not present

## 2021-06-02 DIAGNOSIS — I10 Essential (primary) hypertension: Secondary | ICD-10-CM | POA: Diagnosis not present

## 2021-06-02 DIAGNOSIS — E039 Hypothyroidism, unspecified: Secondary | ICD-10-CM | POA: Diagnosis not present

## 2021-06-02 DIAGNOSIS — I25810 Atherosclerosis of coronary artery bypass graft(s) without angina pectoris: Secondary | ICD-10-CM | POA: Diagnosis not present

## 2021-07-11 DIAGNOSIS — E039 Hypothyroidism, unspecified: Secondary | ICD-10-CM | POA: Diagnosis not present

## 2021-07-11 DIAGNOSIS — R7301 Impaired fasting glucose: Secondary | ICD-10-CM | POA: Diagnosis not present

## 2021-07-11 DIAGNOSIS — E785 Hyperlipidemia, unspecified: Secondary | ICD-10-CM | POA: Diagnosis not present

## 2021-07-11 DIAGNOSIS — E538 Deficiency of other specified B group vitamins: Secondary | ICD-10-CM | POA: Diagnosis not present

## 2021-07-11 DIAGNOSIS — Z125 Encounter for screening for malignant neoplasm of prostate: Secondary | ICD-10-CM | POA: Diagnosis not present

## 2021-07-11 DIAGNOSIS — M859 Disorder of bone density and structure, unspecified: Secondary | ICD-10-CM | POA: Diagnosis not present

## 2021-07-22 DIAGNOSIS — N401 Enlarged prostate with lower urinary tract symptoms: Secondary | ICD-10-CM | POA: Diagnosis not present

## 2021-07-22 DIAGNOSIS — Z23 Encounter for immunization: Secondary | ICD-10-CM | POA: Diagnosis not present

## 2021-07-22 DIAGNOSIS — R7301 Impaired fasting glucose: Secondary | ICD-10-CM | POA: Diagnosis not present

## 2021-07-22 DIAGNOSIS — I10 Essential (primary) hypertension: Secondary | ICD-10-CM | POA: Diagnosis not present

## 2021-07-22 DIAGNOSIS — R82998 Other abnormal findings in urine: Secondary | ICD-10-CM | POA: Diagnosis not present

## 2021-07-22 DIAGNOSIS — D696 Thrombocytopenia, unspecified: Secondary | ICD-10-CM | POA: Diagnosis not present

## 2021-07-22 DIAGNOSIS — I252 Old myocardial infarction: Secondary | ICD-10-CM | POA: Diagnosis not present

## 2021-07-22 DIAGNOSIS — I25810 Atherosclerosis of coronary artery bypass graft(s) without angina pectoris: Secondary | ICD-10-CM | POA: Diagnosis not present

## 2021-07-22 DIAGNOSIS — M8580 Other specified disorders of bone density and structure, unspecified site: Secondary | ICD-10-CM | POA: Diagnosis not present

## 2021-07-22 DIAGNOSIS — Z0000 Encounter for general adult medical examination without abnormal findings: Secondary | ICD-10-CM | POA: Diagnosis not present

## 2021-07-22 DIAGNOSIS — E785 Hyperlipidemia, unspecified: Secondary | ICD-10-CM | POA: Diagnosis not present

## 2021-07-22 DIAGNOSIS — I700 Atherosclerosis of aorta: Secondary | ICD-10-CM | POA: Diagnosis not present

## 2021-07-22 DIAGNOSIS — E039 Hypothyroidism, unspecified: Secondary | ICD-10-CM | POA: Diagnosis not present

## 2021-07-22 DIAGNOSIS — Z1331 Encounter for screening for depression: Secondary | ICD-10-CM | POA: Diagnosis not present

## 2021-07-22 DIAGNOSIS — Z1389 Encounter for screening for other disorder: Secondary | ICD-10-CM | POA: Diagnosis not present

## 2021-07-29 DIAGNOSIS — L821 Other seborrheic keratosis: Secondary | ICD-10-CM | POA: Diagnosis not present

## 2021-07-29 DIAGNOSIS — L538 Other specified erythematous conditions: Secondary | ICD-10-CM | POA: Diagnosis not present

## 2021-07-29 DIAGNOSIS — L814 Other melanin hyperpigmentation: Secondary | ICD-10-CM | POA: Diagnosis not present

## 2021-07-29 DIAGNOSIS — D225 Melanocytic nevi of trunk: Secondary | ICD-10-CM | POA: Diagnosis not present

## 2021-07-29 DIAGNOSIS — C44329 Squamous cell carcinoma of skin of other parts of face: Secondary | ICD-10-CM | POA: Diagnosis not present

## 2021-07-29 DIAGNOSIS — D485 Neoplasm of uncertain behavior of skin: Secondary | ICD-10-CM | POA: Diagnosis not present

## 2021-07-29 DIAGNOSIS — L570 Actinic keratosis: Secondary | ICD-10-CM | POA: Diagnosis not present

## 2021-07-29 DIAGNOSIS — L298 Other pruritus: Secondary | ICD-10-CM | POA: Diagnosis not present

## 2021-09-02 DIAGNOSIS — I25810 Atherosclerosis of coronary artery bypass graft(s) without angina pectoris: Secondary | ICD-10-CM | POA: Diagnosis not present

## 2021-09-02 DIAGNOSIS — E039 Hypothyroidism, unspecified: Secondary | ICD-10-CM | POA: Diagnosis not present

## 2021-09-02 DIAGNOSIS — I10 Essential (primary) hypertension: Secondary | ICD-10-CM | POA: Diagnosis not present

## 2021-09-02 DIAGNOSIS — E785 Hyperlipidemia, unspecified: Secondary | ICD-10-CM | POA: Diagnosis not present

## 2021-10-03 DIAGNOSIS — L298 Other pruritus: Secondary | ICD-10-CM | POA: Diagnosis not present

## 2021-10-03 DIAGNOSIS — L538 Other specified erythematous conditions: Secondary | ICD-10-CM | POA: Diagnosis not present

## 2021-10-03 DIAGNOSIS — R208 Other disturbances of skin sensation: Secondary | ICD-10-CM | POA: Diagnosis not present

## 2021-10-03 DIAGNOSIS — L820 Inflamed seborrheic keratosis: Secondary | ICD-10-CM | POA: Diagnosis not present

## 2021-10-03 DIAGNOSIS — D049 Carcinoma in situ of skin, unspecified: Secondary | ICD-10-CM | POA: Diagnosis not present

## 2021-10-30 DIAGNOSIS — L538 Other specified erythematous conditions: Secondary | ICD-10-CM | POA: Diagnosis not present

## 2021-10-30 DIAGNOSIS — L820 Inflamed seborrheic keratosis: Secondary | ICD-10-CM | POA: Diagnosis not present

## 2021-10-30 DIAGNOSIS — R208 Other disturbances of skin sensation: Secondary | ICD-10-CM | POA: Diagnosis not present

## 2021-10-30 DIAGNOSIS — L298 Other pruritus: Secondary | ICD-10-CM | POA: Diagnosis not present

## 2022-01-06 DIAGNOSIS — Z86007 Personal history of in-situ neoplasm of skin: Secondary | ICD-10-CM | POA: Diagnosis not present

## 2022-01-06 DIAGNOSIS — L538 Other specified erythematous conditions: Secondary | ICD-10-CM | POA: Diagnosis not present

## 2022-01-06 DIAGNOSIS — L570 Actinic keratosis: Secondary | ICD-10-CM | POA: Diagnosis not present

## 2022-01-06 DIAGNOSIS — L821 Other seborrheic keratosis: Secondary | ICD-10-CM | POA: Diagnosis not present

## 2022-01-06 DIAGNOSIS — R208 Other disturbances of skin sensation: Secondary | ICD-10-CM | POA: Diagnosis not present

## 2022-01-06 DIAGNOSIS — Z08 Encounter for follow-up examination after completed treatment for malignant neoplasm: Secondary | ICD-10-CM | POA: Diagnosis not present

## 2022-01-06 DIAGNOSIS — L820 Inflamed seborrheic keratosis: Secondary | ICD-10-CM | POA: Diagnosis not present

## 2022-01-06 DIAGNOSIS — D225 Melanocytic nevi of trunk: Secondary | ICD-10-CM | POA: Diagnosis not present

## 2022-04-21 DIAGNOSIS — H43813 Vitreous degeneration, bilateral: Secondary | ICD-10-CM | POA: Diagnosis not present

## 2022-04-24 ENCOUNTER — Encounter (HOSPITAL_BASED_OUTPATIENT_CLINIC_OR_DEPARTMENT_OTHER): Payer: Self-pay | Admitting: Obstetrics and Gynecology

## 2022-04-24 ENCOUNTER — Emergency Department (HOSPITAL_BASED_OUTPATIENT_CLINIC_OR_DEPARTMENT_OTHER)
Admission: EM | Admit: 2022-04-24 | Discharge: 2022-04-24 | Disposition: A | Discharge status: Home / Self Care | Payer: Medicare Other | Attending: Emergency Medicine | Admitting: Emergency Medicine

## 2022-04-24 ENCOUNTER — Other Ambulatory Visit: Payer: Self-pay

## 2022-04-24 ENCOUNTER — Emergency Department (HOSPITAL_BASED_OUTPATIENT_CLINIC_OR_DEPARTMENT_OTHER): Payer: Medicare Other

## 2022-04-24 ENCOUNTER — Telehealth: Payer: Self-pay | Admitting: Cardiovascular Disease

## 2022-04-24 DIAGNOSIS — R030 Elevated blood-pressure reading, without diagnosis of hypertension: Secondary | ICD-10-CM | POA: Diagnosis not present

## 2022-04-24 DIAGNOSIS — E041 Nontoxic single thyroid nodule: Secondary | ICD-10-CM | POA: Diagnosis not present

## 2022-04-24 DIAGNOSIS — R519 Headache, unspecified: Secondary | ICD-10-CM | POA: Diagnosis not present

## 2022-04-24 DIAGNOSIS — Z7982 Long term (current) use of aspirin: Secondary | ICD-10-CM | POA: Insufficient documentation

## 2022-04-24 DIAGNOSIS — I6523 Occlusion and stenosis of bilateral carotid arteries: Secondary | ICD-10-CM | POA: Diagnosis not present

## 2022-04-24 DIAGNOSIS — I2510 Atherosclerotic heart disease of native coronary artery without angina pectoris: Secondary | ICD-10-CM | POA: Insufficient documentation

## 2022-04-24 DIAGNOSIS — Z951 Presence of aortocoronary bypass graft: Secondary | ICD-10-CM | POA: Diagnosis not present

## 2022-04-24 LAB — BASIC METABOLIC PANEL
Anion gap: 6 (ref 5–15)
BUN: 10 mg/dL (ref 8–23)
CO2: 29 mmol/L (ref 22–32)
Calcium: 9.2 mg/dL (ref 8.9–10.3)
Chloride: 105 mmol/L (ref 98–111)
Creatinine, Ser: 1.11 mg/dL (ref 0.61–1.24)
GFR, Estimated: >60 mL/min (ref >60)
Glucose, Bld: 99 mg/dL (ref 70–99)
Potassium: 4.4 mmol/L (ref 3.5–5.1)
Sodium: 140 mmol/L (ref 135–145)

## 2022-04-24 LAB — CBC WITH DIFFERENTIAL/PLATELET
Abs Immature Granulocytes: 0.01 10*3/uL (ref 0.00–0.07)
Basophils Absolute: 0 10*3/uL (ref 0.0–0.1)
Basophils Relative: 0 %
Eosinophils Absolute: 0.3 10*3/uL (ref 0.0–0.5)
Eosinophils Relative: 4 %
HCT: 45.1 % (ref 39.0–52.0)
Hemoglobin: 15.6 g/dL (ref 13.0–17.0)
Immature Granulocytes: 0 %
Lymphocytes Relative: 19 %
Lymphs Abs: 1.4 10*3/uL (ref 0.7–4.0)
MCH: 32.3 pg (ref 26.0–34.0)
MCHC: 34.6 g/dL (ref 30.0–36.0)
MCV: 93.4 fL (ref 80.0–100.0)
Monocytes Absolute: 0.5 10*3/uL (ref 0.1–1.0)
Monocytes Relative: 7 %
Neutro Abs: 5.1 10*3/uL (ref 1.7–7.7)
Neutrophils Relative %: 70 %
Platelets: 150 10*3/uL (ref 150–400)
RBC: 4.83 MIL/uL (ref 4.22–5.81)
RDW: 11.8 % (ref 11.5–15.5)
WBC: 7.2 10*3/uL (ref 4.0–10.5)
nRBC: 0 % (ref 0.0–0.2)

## 2022-04-24 LAB — PROTIME-INR
INR: 1.1 (ref 0.8–1.2)
Prothrombin Time: 14.3 seconds (ref 11.4–15.2)

## 2022-04-24 LAB — SEDIMENTATION RATE: Sed Rate: 5 mm/hr (ref 0–16)

## 2022-04-24 LAB — TROPONIN I (HIGH SENSITIVITY)
Troponin I (High Sensitivity): 5 ng/L (ref <18)
Troponin I (High Sensitivity): 5 ng/L (ref <18)

## 2022-04-24 MED ORDER — KETOROLAC TROMETHAMINE 30 MG/ML IJ SOLN
15.0000 mg | Freq: Once | INTRAMUSCULAR | Status: AC
  Administered 2022-04-24: 15 mg via INTRAVENOUS
  Filled 2022-04-24: qty 1

## 2022-04-24 MED ORDER — METOCLOPRAMIDE HCL 5 MG/ML IJ SOLN
5.0000 mg | Freq: Once | INTRAMUSCULAR | Status: AC
  Administered 2022-04-24: 5 mg via INTRAVENOUS
  Filled 2022-04-24: qty 2

## 2022-04-24 MED ORDER — IOHEXOL 350 MG/ML SOLN
100.0000 mL | Freq: Once | INTRAVENOUS | Status: AC | PRN
Start: 2022-04-24 — End: 2022-04-24
  Administered 2022-04-24: 100 mL via INTRAVENOUS

## 2022-04-24 MED ORDER — DIPHENHYDRAMINE HCL 50 MG/ML IJ SOLN
12.5000 mg | Freq: Once | INTRAMUSCULAR | Status: AC
  Administered 2022-04-24: 12.5 mg via INTRAVENOUS
  Filled 2022-04-24: qty 1

## 2022-04-24 NOTE — ED Triage Notes (Signed)
Patient reports he felt and heard a pop in his left temple this morning. Patient reports since then he has had a persistent headache that he has not yet taken anything for. Patient endorses high blood pressure at home. Patient's spouse called the PCP's office to report the symptoms and he was encouraged to come here for evaluation.

## 2022-04-24 NOTE — Discharge Instructions (Signed)
Your testing is reassuring and does not show any evidence of bleeding in the brain or aneurysm.  You declined lumbar puncture today but we believe this is safe given that your CT scan was done within 6 hours of your headache.  Follow-up with your doctor and keep a record of your blood pressure.  Return to the ED with worsening headache, unilateral weakness, numbness, tingling, difficulty speaking, difficulty swallowing, any other concerns

## 2022-04-24 NOTE — Telephone Encounter (Signed)
Pt c/o BP issue: STAT if pt c/o blurred vision, one-sided weakness or slurred speech  1. What are your last 5 BP readings?  175/98  2. Are you having any other symptoms (ex. Dizziness, headache, blurred vision, passed out)? Headache  3. What is your BP issue? Spouse states that pt felt what seemed like a "pop" in his temple. Pt has had a headache since and BP is elevated. Please advise

## 2022-04-24 NOTE — ED Notes (Signed)
MD requested CT to be contacted. CT contacted. CT stated that they had a out Pt on the scanner and when done they would come get the Pt. Provider aware.

## 2022-04-24 NOTE — Telephone Encounter (Signed)
Spoke with patient's wife Ruben Burton (on [place]) who reports patient  felt a "pop in his left temple" around noon today, he then complained of a headache and checked his BP with reading of 175/98, HR 53.  Patient reports he still has a headache (almost 2 hours later) that he rates 4/10, it has been steady since it first came on. He states he has not taken any pain relievers. Patient states he was not doing anything strenuous when the pop/headache occurred, he was just sitting down in his chair. He denies feeling any stress or tension.   Patient states he does feel slightly nauseated, denies vomiting, stiff neck, blurred vision, roaring in ears, or CP.  Patient rechecked BP while on the phone with me: 176/93, HR 57 in left arm 161/91, HR 55 in right arm  Discussed the above with DOD Dr. Gwyndolyn Kaufman who recommends patient go to [facility] ED for evaluation.  Ruben Burton verbalized understanding of the above and will take patient to [facility] ED.   Patient has appt with Dr. Angelena Form on 04/30/22

## 2022-04-24 NOTE — ED Notes (Signed)
CT called about patient. Per CT, will make a note to retrieve patient soon. Medic, Josh, made aware.

## 2022-04-24 NOTE — ED Provider Notes (Incomplete)
[place] EMERGENCY DEPT Provider Note   CSN: [id number] Arrival date & time: 04/24/22  1451     History {Add pertinent medical, surgical, social history, OB history to HPI:1} Chief Complaint  Patient presents with   Headache   Hypertension    Ruben Burton is a 81 y.o. male.  Patient with a history of CAD status post CABG, atrial fibrillation, not on anticoagulation presenting with sudden onset left-sided headache.  States he was working in his garden husky inquiring when suddenly he felt a "pop" to the left side of his head about 11 AM.  Headache has been constant since then.  He checked his blood pressure and found it to be elevated at 536 systolic despite taking his medications this morning.  Has had some lightheadedness and nausea since this occurred.  No history of similar headaches.  Denies any difficulty speaking or difficulty swallowing.  No focal weakness, numbness or tingling.  No chest pain or shortness of breath.  No fever.   The history is provided by the patient.  Headache Associated symptoms: nausea   Associated symptoms: no abdominal pain, no congestion, no cough, no dizziness, no fever, no myalgias, no vomiting and no weakness   Hypertension Associated symptoms include headaches. Pertinent negatives include no chest pain, no abdominal pain and no shortness of breath.       Home Medications Prior to Admission medications   Medication Sig Start Date End Date Taking? Authorizing Provider  aspirin EC 81 MG tablet Take 1 tablet (81 mg total) by mouth every evening. 09/17/19   Rehman, Mechele Dawley, MD  B Complex-C (B-COMPLEX WITH VITAMIN C) tablet Take 1 tablet by mouth daily after breakfast.    [provider]  calcium carbonate (OS-CAL) 1250 (500 Ca) MG chewable tablet Chew 1 tablet by mouth 2 (two) times daily.     [provider]  esomeprazole (NEXIUM) 40 MG capsule Take 40 mg by mouth daily at 12 noon.    [provider]   metoprolol (LOPRESSOR) 25 MG tablet Take one tablet by mouth twice daily. Patient taking differently: Take 25 mg by mouth 2 (two) times daily. 05/09/11   Burnell Blanks, MD  mometasone (ELOCON) 0.1 % cream Apply 1 application topically daily as needed (ear itching.).  09/02/19   [provider]  nitroGLYCERIN (NITROSTAT) 0.4 MG SL tablet Place 1 tablet (0.4 mg total) under the tongue every 5 (five) minutes as needed for chest pain. Patient taking differently: Place 0.4 mg under the tongue every 5 (five) minutes x 3 doses as needed for chest pain. 01/10/14   Burnell Blanks, MD  rosuvastatin (CRESTOR) 20 MG tablet TAKE 1 TABLET DAILY Patient taking differently: Take 20 mg by mouth every evening. 07/26/18   Burnell Blanks, MD  tamsulosin (FLOMAX) 0.4 MG CAPS capsule Take 0.4 mg by mouth daily.  07/07/16   [provider]      Allergies    Omeprazole, Pantoprazole, and Oxytetracycline    Review of Systems   Review of Systems  Constitutional:  Negative for activity change, appetite change and fever.  HENT:  Negative for congestion and rhinorrhea.   Eyes:  Negative for visual disturbance.  Respiratory:  Negative for cough and shortness of breath.   Cardiovascular:  Negative for chest pain.  Gastrointestinal:  Positive for nausea. Negative for abdominal pain and vomiting.  Genitourinary:  Negative for dysuria, hematuria and urgency.  Musculoskeletal:  Negative for arthralgias and myalgias.  Skin:  Negative for rash.  Neurological:  Positive for light-headedness and headaches. Negative for dizziness and weakness.   all other systems are negative except as noted in the HPI and PMH.    Physical Exam Updated Vital Signs BP (!) 178/94   Pulse 62   Temp 98 F (36.7 C)   Resp 16   SpO2 99%  Physical Exam Vitals and nursing note reviewed.  Constitutional:      General: He is not in acute distress.    Appearance: He is well-developed.  HENT:      Head: Normocephalic and atraumatic.     Comments: No temporal artery tenderness    Mouth/Throat:     Pharynx: No oropharyngeal exudate.  Eyes:     Conjunctiva/sclera: Conjunctivae normal.     Pupils: Pupils are equal, round, and reactive to light.  Neck:     Comments: No meningismus. Cardiovascular:     Rate and Rhythm: Normal rate and regular rhythm.     Heart sounds: Normal heart sounds. No murmur heard. Pulmonary:     Effort: Pulmonary effort is normal. No respiratory distress.     Breath sounds: Normal breath sounds.  Abdominal:     Palpations: Abdomen is soft.     Tenderness: There is no abdominal tenderness. There is no guarding or rebound.  Musculoskeletal:        General: No tenderness. Normal range of motion.     Cervical back: Normal range of motion and neck supple.  Skin:    General: Skin is warm.  Neurological:     Mental Status: He is alert and oriented to person, place, and time.     Cranial Nerves: No cranial nerve deficit.     Motor: No abnormal muscle tone.     Coordination: Coordination normal.     Comments: No ataxia on finger to nose bilaterally. No pronator drift. 5/5 strength throughout. CN 2-12 intact.Equal grip strength. Sensation intact.   Psychiatric:        Behavior: Behavior normal.     ED Results / Procedures / Treatments   Labs (all labs ordered are listed, but only abnormal results are displayed) Labs Reviewed  CBC WITH DIFFERENTIAL/PLATELET  BASIC METABOLIC PANEL  PROTIME-INR  SEDIMENTATION RATE  TROPONIN I (HIGH SENSITIVITY)    EKG None  Radiology No results found.  Procedures Procedures  {Document cardiac monitor, telemetry assessment procedure when appropriate:1}  Medications Ordered in ED Medications - No data to display  ED Course/ Medical Decision Making/ A&P                           Sudden onset headache while working in his garden.  Associate with nausea.  Neurological exam is nonfocal.  He is hypertensive.   Emergent head CT will be obtained with concern for subarachnoid hemorrhage.  States he takes aspirin but no other blood thinners.  CT head is obtained within 6 hours of sudden onset headache.  This is negative for hemorrhage.  Results reviewed and interpreted by me.  CT angiogram does not show any aneurysm or stenosis.  Discussed with patient.  Feel that [place] ruled out greater than 99%.  Discussed with Dr. Leonel Ramsay of neurology who agrees with negative head CT within 6 hours of sudden onset headache as well as CTA showing no aneurysms no further work-up indicated and lumbar puncture likely unnecessary.  {Document critical care time when appropriate:1} {Document review of labs and clinical decision tools ie  heart score, Chads2Vasc2 etc:1}  {Document your independent review of radiology images, and any outside records:1} {Document your discussion with family members, caretakers, and with consultants:1} {Document social determinants of health affecting pt's care:1} {Document your decision making why or why not admission, treatments were needed:1} Final Clinical Impression(s) / ED Diagnoses Final diagnoses:  None    Rx / DC Orders ED Discharge Orders     None

## 2022-04-30 ENCOUNTER — Ambulatory Visit (INDEPENDENT_AMBULATORY_CARE_PROVIDER_SITE_OTHER): Payer: Medicare Other | Admitting: Cardiovascular Disease

## 2022-04-30 ENCOUNTER — Encounter: Payer: Self-pay | Admitting: Cardiovascular Disease

## 2022-04-30 VITALS — BP 110/70 | HR 54 | Ht 72.0 in | Wt 197.2 lb

## 2022-04-30 DIAGNOSIS — I10 Essential (primary) hypertension: Secondary | ICD-10-CM | POA: Diagnosis not present

## 2022-04-30 DIAGNOSIS — E7800 Pure hypercholesterolemia, unspecified: Secondary | ICD-10-CM

## 2022-04-30 DIAGNOSIS — I25810 Atherosclerosis of coronary artery bypass graft(s) without angina pectoris: Secondary | ICD-10-CM

## 2022-04-30 NOTE — Patient Instructions (Signed)
Medication Instructions:  Your physician recommends that you continue on your current medications as directed. Please refer to the Current Medication list given to you today.  *If you need a refill on your cardiac medications before your next appointment, please call your pharmacy*    Follow-Up: At [facility], you and your health needs are our priority.  As part of our continuing mission to provide you with exceptional heart care, we have created designated Provider Care Teams.  These Care Teams include your primary Cardiologist (physician) and Advanced Practice Providers (APPs -  Physician Assistants and Nurse Practitioners) who all work together to provide you with the care you need, when you need it.    Your next appointment:   1 year(s)  The format for your next appointment:   In Person  Provider:   [name], MD       Important Information About Sugar

## 2022-04-30 NOTE — Progress Notes (Signed)
Chief Complaint  Patient presents with   Follow-up    CAD, HTN   History of Present Illness: 81 yo male with history of CAD s/p CABG in 2011, HTN, HLD and post-op atrial fibrillation here today for follow up.  He was admitted in April 2011 with a NSTEMI and was found to have severe disease in the LAD and Diagonal. He underwent 2V CABG with LIMA to LAD and SVG to Diagonal. Postop course was complicated by atrial fibrillation. Echo 2011 with LVEF 55-60%, mildly dilated RV. No evidence of atrial fib on post-op cardiac monitor in 2011.  Coumadin was discontinued. Stress myoview February 2016 with no ischemia. PVCs noted on EKG during stress. Admitted to [facility] December 2017 with chest pain. Cardiac cath 10/16/16 with stable CAD (occluded mid LAD and Diagonal with patent LIMA to LAD and patent SVG to Diagonal). The RCA had mild to moderate plaque. The Circumflex has no obstructive disease. PVCs noted on monitor during hospitalization. Echo December 2018 with normal LV systolic function, grade 2 diastolic dysfunction, no valve disease. He was seen in our office 01/14/21 by Robbie Lis, PA-C with c/o dizziness. It was felt to be related to vertigo. Carotid artery dopplers with no significant carotid artery disease. Possible disease in the right vertebral artery.   He is here today for follow up. The patient denies any chest pain, dyspnea, palpitations, lower extremity edema, orthopnea, PND, dizziness, near syncope or syncope.   Primary Care Physician: Ginger Organ., MD  Past Medical History:  Diagnosis Date   Arthritis    "mild; fingers" (10/16/2016)   CAD, ARTERY BYPASS GRAFT 03/07/2010   Qualifier: Diagnosis of  By: Orville Govern CMA, [place]     Chest pain 10/16/2016   Coronary artery disease    a. CABG in 2011 b. Cath 10/16/16 - chronic total occlusion of mid LAD with patietn LIMA to mid LAD; severe ostial stenosis to large diagonal with patent SVG to diagonal; moderate non obstrctive RCA, normal LV  function. Tx Rx   GERD 03/07/2010   Qualifier: Diagnosis of  By: Orville Govern, CMA, Carol     GERD (gastroesophageal reflux disease)    Hx of cardiovascular stress test    a. ETT-MV 1/14: no ischemia; not gated   Hyperlipidemia    HYPERLIPIDEMIA 03/07/2010   Qualifier: Diagnosis of  By: Orville Govern CMA, Carol     Hypertension    HYPERTENSION 03/07/2010   Qualifier: Diagnosis of  By: Orville Govern CMA, [place]     PAROXYSMAL ATRIAL FIBRILLATION 03/07/2010   Qualifier: Diagnosis of  By: Orville Govern CMA, Carol     Paroxysmal atrial fibrillation ([place])    Pleural effusion    PLEURAL EFFUSION, LEFT 03/07/2010   Qualifier: Diagnosis of  By: Orville Govern, CMA, Carol     Precordial pain    Unstable angina ([place]) 10/16/2016   Unstable angina pectoris ([place]) 10/16/2016    Past Surgical History:  Procedure Laterality Date   BIOPSY  09/16/2019   Procedure: BIOPSY;  Surgeon: Rogene Houston, MD;  [facility];  Service: Endoscopy;;  gastric    CARDIAC CATHETERIZATION  ~ 2011; 10/16/2016   CARDIAC CATHETERIZATION N/A 10/16/2016   Procedure: Left Heart Cath and Cors/Grafts Angiography;  Surgeon: Burnell Blanks, MD;  Location: [facility];  Service: Cardiovascular;  Laterality: N/A;   CHOLECYSTECTOMY OPEN  ~ 1992   COLONOSCOPY N/A 12/01/2013   Procedure: COLONOSCOPY;  Surgeon: Rogene Houston, MD;  [facility]: [facility];  Service:  Endoscopy;  Laterality: N/A;  830   COLONOSCOPY WITH PROPOFOL N/A 09/16/2019   Procedure: COLONOSCOPY WITH PROPOFOL;  Surgeon: Rogene Houston, MD;  [facility];  Service: Endoscopy;  Laterality: N/A;   CORONARY ARTERY BYPASS GRAFT  02-13-10   2V (LIMA to LAD, SVG to diagonal)   ESOPHAGOGASTRODUODENOSCOPY (EGD) WITH ESOPHAGEAL DILATION     ESOPHAGOGASTRODUODENOSCOPY (EGD) WITH PROPOFOL N/A 09/16/2019   Procedure: ESOPHAGOGASTRODUODENOSCOPY (EGD) WITH PROPOFOL;  Surgeon: Rogene Houston, MD;  [facility];  Service: Endoscopy;  Laterality: N/A;  955am    EXPLORATORY LAPAROTOMY  ~ 1995   for perforated duodenal ulcer   HYDROCELE EXCISION     POLYPECTOMY  09/16/2019   Procedure: POLYPECTOMY;  Surgeon: Rogene Houston, MD;  [facility];  Service: Endoscopy;;  colon    Current Outpatient Medications  Medication Sig Dispense Refill   aspirin EC 81 MG tablet Take 1 tablet (81 mg total) by mouth every evening.     B Complex-C (B-COMPLEX WITH VITAMIN C) tablet Take 1 tablet by mouth daily after breakfast.     calcium carbonate (OS-CAL) 1250 (500 Ca) MG chewable tablet Chew 1 tablet by mouth 2 (two) times daily.      metoprolol (LOPRESSOR) 25 MG tablet Take one tablet by mouth twice daily. 180 tablet 3   nitroGLYCERIN (NITROSTAT) 0.4 MG SL tablet Place 1 tablet (0.4 mg total) under the tongue every 5 (five) minutes as needed for chest pain. 25 tablet 11   rosuvastatin (CRESTOR) 20 MG tablet TAKE 1 TABLET DAILY 90 tablet 2   tamsulosin (FLOMAX) 0.4 MG CAPS capsule Take 0.4 mg by mouth daily.      No current facility-administered medications for this visit.    Allergies  Allergen Reactions   Omeprazole Nausea Only   Pantoprazole Nausea Only   Oxytetracycline Other (See Comments)    Passes out.    Social History   Socioeconomic History   Marital status: Married    Spouse name: Not on file   Number of children: 2   Years of education: Not on file   Highest education level: Not on file  Occupational History   Occupation: retirede    Fish farm manager: RETIRED  Tobacco Use   Smoking status: Former    Packs/day: 0.25    Years: 1.00    Total pack years: 0.25    Types: Cigarettes    Quit date: 1960    Years since quitting: 63.5   Smokeless tobacco: Never  Vaping Use   Vaping Use: Never used  Substance and Sexual Activity   Alcohol use: No   Drug use: No   Sexual activity: Yes  Other Topics Concern   Not on file  Social History Narrative   Not on file   Social Determinants of Health   Financial Resource Strain: Not on  file  Food Insecurity: Not on file  Transportation Needs: Not on file  Physical Activity: Not on file  Stress: Not on file  Social Connections: Not on file  Intimate Partner Violence: Not on file    Family History  Problem Relation Age of Onset   Other Mother        alive- healthy   Hypertension Mother    Stroke Father        deceased   Hypertension Father    Other Brother        CABG   Heart attack Brother    Hypertension Brother  Colon cancer Neg Hx     Review of Systems:  As stated in the HPI and otherwise negative.   BP 110/70   Pulse (!) 54   Ht 6' (1.829 m)   Wt 197 lb 3.2 oz (89.4 kg)   SpO2 99%   BMI 26.75 kg/m   Physical Examination:  General: Well developed, well nourished, NAD  HEENT: OP clear, mucus membranes moist  SKIN: warm, dry. No rashes. Neuro: No focal deficits  Musculoskeletal: Muscle strength 5/5 all ext  Psychiatric: Mood and affect normal  Neck: No JVD, no carotid bruits, no thyromegaly, no lymphadenopathy.  Lungs:Clear bilaterally, no wheezes, rhonci, crackles Cardiovascular: Regular rate and rhythm. No murmurs, gallops or rubs. Abdomen:Soft. Bowel sounds present. Non-tender.  Extremities: No lower extremity edema. Pulses are 2 + in the bilateral DP/PT.  Cardiac cath December 2017: Diagnostic Diagram        Echo 10/19/17: Left ventricle: The cavity size was normal. Wall thickness was   normal. Systolic function was normal. The estimated ejection   fraction was in the range of 60% to 65%. Wall motion was normal;   there were no regional wall motion abnormalities. Features are   consistent with a pseudonormal left ventricular filling pattern,   with concomitant abnormal relaxation and increased filling   pressure (grade 2 diastolic dysfunction).  EKG:  EKG is ordered today. The ekg ordered today demonstrates sinus, chronic TWI  Recent Labs: 04/24/2022: BUN 10; Creatinine, Ser 1.11; Hemoglobin 15.6; Platelets 150; Potassium 4.4;  Sodium 140   Lipids: followed in primary care:   Wt Readings from Last 3 Encounters:  04/30/22 197 lb 3.2 oz (89.4 kg)  05/02/21 197 lb (89.4 kg)  01/14/21 198 lb 12.8 oz (90.2 kg)     Other studies Reviewed: Additional studies/ records that were reviewed today include:. Review of the above records demonstrates:   Assessment and Plan:   1. Coronary Artery Disease without angina: He is s/p CABG in 2011. Cardiac cath in December 2017 with stable CAD with patent bypass grafts. Echo December 2018 with normal LV systolic function. No chest pain. Continue ASA, beta blocker and statin.    2. Hypertension: BP is controlled. No changes  3. Hyperlipidemia: Lipids managed in primary care. Continue statin  4. Paroxysmal atrial fibrillation: He has had no known recurrences of atrial fibrillation since his post-op period in 2011. Continue Lopressor.   Current medicines are reviewed at length with the patient today.  The patient does not have concerns regarding medicines.  The following changes have been made:  no change  Labs/ tests ordered today include:   Orders Placed This Encounter  Procedures   EKG 12-Lead     Disposition:   F/U with me in 12 months  Signed, [name], MD 04/30/2022 9:47 AM    [facility] [address], [place], [place]  [address] Phone: [phone]; Fax: [phone]

## 2022-05-01 DIAGNOSIS — N400 Enlarged prostate without lower urinary tract symptoms: Secondary | ICD-10-CM | POA: Diagnosis not present

## 2022-05-20 DIAGNOSIS — I10 Essential (primary) hypertension: Secondary | ICD-10-CM | POA: Diagnosis not present

## 2022-05-20 DIAGNOSIS — R609 Edema, unspecified: Secondary | ICD-10-CM | POA: Diagnosis not present

## 2022-05-22 DIAGNOSIS — L820 Inflamed seborrheic keratosis: Secondary | ICD-10-CM | POA: Diagnosis not present

## 2022-05-22 DIAGNOSIS — L538 Other specified erythematous conditions: Secondary | ICD-10-CM | POA: Diagnosis not present

## 2022-05-22 DIAGNOSIS — D225 Melanocytic nevi of trunk: Secondary | ICD-10-CM | POA: Diagnosis not present

## 2022-05-22 DIAGNOSIS — L821 Other seborrheic keratosis: Secondary | ICD-10-CM | POA: Diagnosis not present

## 2022-05-22 DIAGNOSIS — L814 Other melanin hyperpigmentation: Secondary | ICD-10-CM | POA: Diagnosis not present

## 2022-05-22 DIAGNOSIS — L298 Other pruritus: Secondary | ICD-10-CM | POA: Diagnosis not present

## 2022-07-01 DIAGNOSIS — H6123 Impacted cerumen, bilateral: Secondary | ICD-10-CM | POA: Diagnosis not present

## 2022-07-01 DIAGNOSIS — H903 Sensorineural hearing loss, bilateral: Secondary | ICD-10-CM | POA: Diagnosis not present

## 2022-07-01 DIAGNOSIS — H838X3 Other specified diseases of inner ear, bilateral: Secondary | ICD-10-CM | POA: Diagnosis not present

## 2022-07-01 DIAGNOSIS — H608X3 Other otitis externa, bilateral: Secondary | ICD-10-CM | POA: Diagnosis not present

## 2022-08-07 DIAGNOSIS — Z125 Encounter for screening for malignant neoplasm of prostate: Secondary | ICD-10-CM | POA: Diagnosis not present

## 2022-08-07 DIAGNOSIS — M8580 Other specified disorders of bone density and structure, unspecified site: Secondary | ICD-10-CM | POA: Diagnosis not present

## 2022-08-07 DIAGNOSIS — I10 Essential (primary) hypertension: Secondary | ICD-10-CM | POA: Diagnosis not present

## 2022-08-07 DIAGNOSIS — E039 Hypothyroidism, unspecified: Secondary | ICD-10-CM | POA: Diagnosis not present

## 2022-08-07 DIAGNOSIS — E538 Deficiency of other specified B group vitamins: Secondary | ICD-10-CM | POA: Diagnosis not present

## 2022-08-07 DIAGNOSIS — R7989 Other specified abnormal findings of blood chemistry: Secondary | ICD-10-CM | POA: Diagnosis not present

## 2022-08-07 DIAGNOSIS — R7301 Impaired fasting glucose: Secondary | ICD-10-CM | POA: Diagnosis not present

## 2022-08-07 DIAGNOSIS — E785 Hyperlipidemia, unspecified: Secondary | ICD-10-CM | POA: Diagnosis not present

## 2022-08-07 DIAGNOSIS — M8589 Other specified disorders of bone density and structure, multiple sites: Secondary | ICD-10-CM | POA: Diagnosis not present

## 2022-08-14 DIAGNOSIS — I700 Atherosclerosis of aorta: Secondary | ICD-10-CM | POA: Diagnosis not present

## 2022-08-14 DIAGNOSIS — Z0000 Encounter for general adult medical examination without abnormal findings: Secondary | ICD-10-CM | POA: Diagnosis not present

## 2022-08-14 DIAGNOSIS — I10 Essential (primary) hypertension: Secondary | ICD-10-CM | POA: Diagnosis not present

## 2022-08-14 DIAGNOSIS — Z1331 Encounter for screening for depression: Secondary | ICD-10-CM | POA: Diagnosis not present

## 2022-08-14 DIAGNOSIS — R82998 Other abnormal findings in urine: Secondary | ICD-10-CM | POA: Diagnosis not present

## 2022-08-14 DIAGNOSIS — I25810 Atherosclerosis of coronary artery bypass graft(s) without angina pectoris: Secondary | ICD-10-CM | POA: Diagnosis not present

## 2022-08-14 DIAGNOSIS — E785 Hyperlipidemia, unspecified: Secondary | ICD-10-CM | POA: Diagnosis not present

## 2022-08-14 DIAGNOSIS — Z23 Encounter for immunization: Secondary | ICD-10-CM | POA: Diagnosis not present

## 2022-08-14 DIAGNOSIS — Z1339 Encounter for screening examination for other mental health and behavioral disorders: Secondary | ICD-10-CM | POA: Diagnosis not present

## 2022-08-14 DIAGNOSIS — M8580 Other specified disorders of bone density and structure, unspecified site: Secondary | ICD-10-CM | POA: Diagnosis not present

## 2022-08-14 DIAGNOSIS — I872 Venous insufficiency (chronic) (peripheral): Secondary | ICD-10-CM | POA: Diagnosis not present

## 2022-08-14 DIAGNOSIS — K219 Gastro-esophageal reflux disease without esophagitis: Secondary | ICD-10-CM | POA: Diagnosis not present

## 2022-10-31 DIAGNOSIS — R109 Unspecified abdominal pain: Secondary | ICD-10-CM | POA: Diagnosis not present

## 2022-12-26 ENCOUNTER — Ambulatory Visit: Payer: Self-pay

## 2022-12-26 ENCOUNTER — Emergency Department (HOSPITAL_BASED_OUTPATIENT_CLINIC_OR_DEPARTMENT_OTHER)
Admission: EM | Admit: 2022-12-26 | Discharge: 2022-12-26 | Disposition: A | Discharge status: Home / Self Care | Payer: Medicare Other | Attending: Emergency Medicine | Admitting: Emergency Medicine

## 2022-12-26 ENCOUNTER — Encounter (HOSPITAL_BASED_OUTPATIENT_CLINIC_OR_DEPARTMENT_OTHER): Payer: Self-pay

## 2022-12-26 ENCOUNTER — Other Ambulatory Visit: Payer: Self-pay

## 2022-12-26 DIAGNOSIS — S61216A Laceration without foreign body of right little finger without damage to nail, initial encounter: Secondary | ICD-10-CM | POA: Diagnosis not present

## 2022-12-26 DIAGNOSIS — W458XXA Other foreign body or object entering through skin, initial encounter: Secondary | ICD-10-CM | POA: Insufficient documentation

## 2022-12-26 DIAGNOSIS — Z7982 Long term (current) use of aspirin: Secondary | ICD-10-CM | POA: Insufficient documentation

## 2022-12-26 DIAGNOSIS — S60456A Superficial foreign body of right little finger, initial encounter: Secondary | ICD-10-CM | POA: Insufficient documentation

## 2022-12-26 DIAGNOSIS — S60459A Superficial foreign body of unspecified finger, initial encounter: Secondary | ICD-10-CM

## 2022-12-26 DIAGNOSIS — S61226A Laceration with foreign body of right little finger without damage to nail, initial encounter: Secondary | ICD-10-CM | POA: Diagnosis not present

## 2022-12-26 DIAGNOSIS — Z79899 Other long term (current) drug therapy: Secondary | ICD-10-CM | POA: Insufficient documentation

## 2022-12-26 MED ORDER — CEPHALEXIN 500 MG PO CAPS: 500.0000 mg | ORAL_CAPSULE | Freq: Four times a day (QID) | ORAL | 0 refills | Status: AC

## 2022-12-26 NOTE — Telephone Encounter (Signed)
  Chief Complaint: Splinter in finger Symptoms: Red painful, splinter still in finger Frequency: 1.5 hours ago Pertinent Negatives: Patient denies  Disposition: []$ ED /[x]$ Urgent Care (no appt availability in office) / []$ Appointment(In office/virtual)/ []$  [facility]/ []$ Home Care/ []$ Refused Recommended Disposition /[]$ [place] [facility]/ []$  Follow-up with PCP Additional Notes: PT has a splinter in his right pinky finger. PT was seen at [facility] in [place]. They did not remove splinter, and did not give pt a tetanus shot. Pt will go to another UC for care.     Reason for Disposition  [1] Caller can't get FB out AND [2] it's causing pain  Answer Assessment - Initial Assessment Questions 1. MECHANISM: "How did it happen?"      Making a picture frame 2. LOCATION: "Where is the FB (e.g., splinter) located?"      End of his finger right hand pinky 3. OBJECT: "What type of FB was it?"      Wood splinter 4. DEPTH: "How deep do you think the FB goes?"       5. ONSET: "When did the injury occur?" (Minutes or hours)      Hour and a half ago 6. PAIN: "Is it painful?" If Yes, ask: "How bad is the pain?"  (Scale 1-10; or mild, moderate, severe)     Hurts 7. TETANUS: "When was the last tetanus booster?"     Needs one.  Protocols used: Skin Foreign Body-A-AH

## 2022-12-26 NOTE — Discharge Instructions (Signed)
Thank you for allow me to be part of your care today.  We were able to successfully remove the remainder of your splinter from your finger.  I have placed 2 sutures that will need to be removed in 7 to 10 days.  The sutures do not dissolve and will have to be cut out.  You may return to the ED to have this done or urgent care.  Please keep your incision dry today, however, tomorrow you are able to bathe and wash your hands normally.  I also recommend keeping it covered and using Neosporin or bacitracin ointment.  I have sent 2 days of an antibiotic to your pharmacy as a prophylaxis to prevent infection.  Please monitor your wound for signs of infection including redness, swelling, increased warmth, tenderness, or drainage.  Please have it reassessed if any of these signs develop.

## 2022-12-26 NOTE — Telephone Encounter (Signed)
This encounter was created in error - please disregard.

## 2022-12-26 NOTE — ED Provider Notes (Signed)
[facility] Provider Note   CSN: [id number] Arrival date & time: 12/26/22  1449     History  Chief Complaint  Patient presents with   Foreign Body    Ruben Burton is a 82 y.o. male presents to the ED from urgent care with complaint of a lodged splinter in his right 5th digit 2-3 hours ago.  He states that UC was unable to remove all of the splinter and only got a portion.  He states he can see where the tip of the splinter is in his finger.  Patient was sanding a frame when a piece of cherry wood went into his finger.  He reports his last tetanus shot was in 2021.  Patient is not currently complaining of pain stating his finger is still numb from the injection done at the UC.  He takes ASA but no prescription anticoagulants.         Home Medications Prior to Admission medications   Medication Sig Start Date End Date Taking? Authorizing Provider  cephALEXin (KEFLEX) 500 MG capsule Take 1 capsule (500 mg total) by mouth 4 (four) times daily for 2 days. 12/26/22 12/28/22 Yes Tyna Huertas R, PA  aspirin EC 81 MG tablet Take 1 tablet (81 mg total) by mouth every evening. 09/17/19   Rehman, Mechele Dawley, MD  B Complex-C (B-COMPLEX WITH VITAMIN C) tablet Take 1 tablet by mouth daily after breakfast.    [provider]  calcium carbonate (OS-CAL) 1250 (500 Ca) MG chewable tablet Chew 1 tablet by mouth 2 (two) times daily.     [provider]  metoprolol (LOPRESSOR) 25 MG tablet Take one tablet by mouth twice daily. 05/09/11   Burnell Blanks, MD  nitroGLYCERIN (NITROSTAT) 0.4 MG SL tablet Place 1 tablet (0.4 mg total) under the tongue every 5 (five) minutes as needed for chest pain. 01/10/14   Burnell Blanks, MD  rosuvastatin (CRESTOR) 20 MG tablet TAKE 1 TABLET DAILY 07/26/18   Burnell Blanks, MD  tamsulosin (FLOMAX) 0.4 MG CAPS capsule Take 0.4 mg by mouth daily.  07/07/16   [provider]       Allergies    Omeprazole, Pantoprazole, and Oxytetracycline    Review of Systems   Review of Systems  Physical Exam Updated Vital Signs BP (!) 160/85 (BP Location: Right Arm)   Pulse 66   Temp 98.7 F (37.1 C) (Oral)   Resp 18   Ht 6' (1.829 m)   Wt 89.4 kg   SpO2 98%   BMI 26.73 kg/m  Physical Exam Vitals and nursing note reviewed.  Constitutional:      General: He is not in acute distress.    Appearance: Normal appearance. He is not ill-appearing or diaphoretic.  Cardiovascular:     Rate and Rhythm: Normal rate and regular rhythm.  Pulmonary:     Effort: Pulmonary effort is normal.  Skin:    General: Skin is warm and dry.     Capillary Refill: Capillary refill takes less than 2 seconds.     Findings: Wound (distal R 5th digit) present.     Comments: Distal tip of right fifth digit with wound and dried blood.  There is a small hole on the medial aspect where patient states prior foreign body removal was attempted.  There is a small dark dot on the lateral aspect likely indicative of remaining splinter/foreign body  Neurological:     Mental Status: He is  alert. Mental status is at baseline.  Psychiatric:        Mood and Affect: Mood normal.        Behavior: Behavior normal.     ED Results / Procedures / Treatments   Labs (all labs ordered are listed, but only abnormal results are displayed) Labs Reviewed - No data to display  EKG None  Radiology No results found.  Procedures .Foreign Body Removal  Date/Time: 12/26/2022 5:04 PM  Performed by: Pat Kocher, PA Authorized by: Pat Kocher, PA  Consent: Verbal consent obtained. Risks and benefits: risks, benefits and alternatives were discussed Consent given by: patient Patient understanding: patient states understanding of the procedure being performed Patient identity confirmed: verbally with patient Body area: skin General location: upper extremity Location details: right small  finger Anesthesia: see MAR for details Complexity: simple 1 objects recovered. Objects recovered: wood splinter Post-procedure assessment: foreign body removed Patient tolerance: patient tolerated the procedure well with no immediate complications Comments: Approximately 0.5 cm wood splinter removed successfully   .Marland KitchenLaceration Repair  Date/Time: 12/26/2022 5:05 PM  Performed by: Pat Kocher, PA Authorized by: Pat Kocher, PA   Consent:    Consent obtained:  Verbal   Consent given by:  Patient   Risks, benefits, and alternatives were discussed: yes     Risks discussed:  Poor cosmetic result, poor wound healing, pain and infection   Alternatives discussed:  No treatment and observation Universal protocol:    Procedure explained and questions answered to patient or proxy's satisfaction: yes     Patient identity confirmed:  Verbally with patient Anesthesia:    Anesthesia method:  None Laceration details:    Location:  Finger   Finger location:  R small finger   Length (cm):  0.8 Exploration:    Hemostasis achieved with:  Tourniquet and direct pressure Treatment:    Area cleansed with:  Saline   Amount of cleaning:  Standard   Debridement:  None Skin repair:    Repair method:  Sutures   Suture size:  4-0   Suture material:  Prolene   Suture technique:  Simple interrupted   Number of sutures:  2 Approximation:    Approximation:  Close Repair type:    Repair type:  Simple Post-procedure details:    Dressing:  Bulky dressing and antibiotic ointment   Procedure completion:  Tolerated well, no immediate complications Comments:     Incision from foreign body removal procedure closed successfully     Medications Ordered in ED Medications - No data to display  ED Course/ Medical Decision Making/ A&P                             Medical Decision Making  This patient presents to the ED with chief complaint(s) of splinter in 5th digit of right hand with pertinent  past medical history of tetanus last done 2021.  The complaint involves an extensive differential diagnosis and also carries with it a high risk of complications and morbidity.    The differential diagnosis includes foreign body in finger, cellulitis of finger, laceration   The initial plan is to remove foreign body  Additional history obtained: Additional history obtained from spouse at bedside who states that she does not feel the entire splinter was removed at [facility]   Initial Assessment:   Exam significant for small dark spot to 5th digit of right hand which is likely the wood  splinter embedded in patient's finger.  There is dried blood and a small hole opposite of this area where previous provider attempted foreign body removal.  No erythema, increased warmth, or bleeding present.  Patient's finger still numb from injection done at UC, unable to assess for intact sensation.  Normal ROM of all digits of right hand.    Treatment and Reassessment: Incision was done to remove foreign body.  Patient's finger previously anesthetized at urgent care, patient still has no sensation in the distal tip.  Successfully removed approximately 0.5cm wood splinter from the finger.  Incision was closed with sutures.  See procedure notes for more detail.  Patient tolerated procedure well.   Disposition:   Patient sent home with 2-day prophylaxis of antibiotics and told to monitor wound for signs of infection.  Instructed patient on how to care for sutures.  Patient verbalized his understanding and will return to ED or UC to have his sutures removed.  The patient has been appropriately medically screened and/or stabilized in the ED. I have low suspicion for any other emergent medical condition which would require further screening, evaluation or treatment in the ED or require inpatient management. At time of discharge the patient is hemodynamically stable and in no acute distress. I have discussed work-up results and  diagnosis with patient and answered all questions. Patient is agreeable with discharge plan. We discussed strict return precautions for returning to the emergency department and they verbalized understanding.           Final Clinical Impression(s) / ED Diagnoses Final diagnoses:  Wood splinter in finger    Rx / DC Orders ED Discharge Orders          Ordered    cephALEXin (KEFLEX) 500 MG capsule  4 times daily        12/26/22 1610              Theressa Stamps [place], [place] 12/26/22 1708    Tegeler, Gwenyth Allegra, MD 12/26/22 [phone]

## 2022-12-26 NOTE — ED Triage Notes (Signed)
Patient here POV from UC.  Endorses having a Splinter lodged into Right fifth Digit 2 Hours ago. UC was able to remove a portion of the Splinter but unsure if all was removed.  Unsure of tetanus.   NAD noted during triage. A&Ox4. GCS 15. Ambulatory.

## 2022-12-31 ENCOUNTER — Telehealth: Payer: Self-pay

## 2022-12-31 NOTE — Telephone Encounter (Signed)
     Patient  visit on 12/26/2022  at [facility] you been able to follow up with your primary care physician? No   The patient was or was not able to obtain any needed medicine or equipment. Yes   Are there diet recommendations that you are having difficulty following? Na   Patient expresses understanding of discharge instructions and education provided has no other needs at this time.  Yes      [facility] [phone] [address], [place], [place] [address] Phone: [phone] Email: [email or web address]

## 2023-01-06 ENCOUNTER — Encounter (HOSPITAL_BASED_OUTPATIENT_CLINIC_OR_DEPARTMENT_OTHER): Payer: Self-pay | Admitting: Emergency Medicine

## 2023-01-06 ENCOUNTER — Other Ambulatory Visit: Payer: Self-pay

## 2023-01-06 ENCOUNTER — Emergency Department (HOSPITAL_BASED_OUTPATIENT_CLINIC_OR_DEPARTMENT_OTHER)
Admission: EM | Admit: 2023-01-06 | Discharge: 2023-01-06 | Disposition: A | Discharge status: Home / Self Care | Payer: Medicare Other | Attending: Emergency Medicine | Admitting: Emergency Medicine

## 2023-01-06 DIAGNOSIS — Z79899 Other long term (current) drug therapy: Secondary | ICD-10-CM | POA: Insufficient documentation

## 2023-01-06 DIAGNOSIS — Z7982 Long term (current) use of aspirin: Secondary | ICD-10-CM | POA: Insufficient documentation

## 2023-01-06 DIAGNOSIS — Z4802 Encounter for removal of sutures: Secondary | ICD-10-CM

## 2023-01-06 NOTE — ED Triage Notes (Signed)
Pt here for suture removal on the right pinky finger.

## 2023-01-06 NOTE — ED Provider Notes (Signed)
[facility] Provider Note   CSN: [id number] Arrival date & time: 01/06/23  [id number]     History  Chief Complaint  Patient presents with   Suture / Staple Removal    Ruben Burton is a 82 y.o. male.  Patient is presenting today for suture removal of his right fifth finger.  Sutures were placed 10 days ago after he had to have the finger cut open to remove a deep splinter.  He has no complaints and feels it is healing well  The history is provided by the patient.  Suture / Staple Removal       Home Medications Prior to Admission medications   Medication Sig Start Date End Date Taking? Authorizing Provider  aspirin EC 81 MG tablet Take 1 tablet (81 mg total) by mouth every evening. 09/17/19   Rehman, Mechele Dawley, MD  B Complex-C (B-COMPLEX WITH VITAMIN C) tablet Take 1 tablet by mouth daily after breakfast.    [provider]  calcium carbonate (OS-CAL) 1250 (500 Ca) MG chewable tablet Chew 1 tablet by mouth 2 (two) times daily.     [provider]  metoprolol (LOPRESSOR) 25 MG tablet Take one tablet by mouth twice daily. 05/09/11   Burnell Blanks, MD  nitroGLYCERIN (NITROSTAT) 0.4 MG SL tablet Place 1 tablet (0.4 mg total) under the tongue every 5 (five) minutes as needed for chest pain. 01/10/14   Burnell Blanks, MD  rosuvastatin (CRESTOR) 20 MG tablet TAKE 1 TABLET DAILY 07/26/18   Burnell Blanks, MD  tamsulosin (FLOMAX) 0.4 MG CAPS capsule Take 0.4 mg by mouth daily.  07/07/16   [provider]      Allergies    Omeprazole, Pantoprazole, and Oxytetracycline    Review of Systems   Review of Systems  Physical Exam Updated Vital Signs BP 133/81 (BP Location: Right Arm)   Pulse (!) 57   Temp 98.2 F (36.8 C) (Oral)   Resp 17   SpO2 100%  Physical Exam Musculoskeletal:     Comments: Well-healed laceration to the tip of the fifth right finger.  2 sutures intact  Neurological:      Mental Status: He is alert.     ED Results / Procedures / Treatments   Labs (all labs ordered are listed, but only abnormal results are displayed) Labs Reviewed - No data to display  EKG None  Radiology No results found.  Procedures .Suture Removal  Date/Time: 01/06/2023 9:25 AM  Performed by: Blanchie Dessert, MD Authorized by: Blanchie Dessert, MD   Consent:    Consent obtained:  Verbal   Consent given by:  Patient   Risks discussed:  Wound separation and pain Universal protocol:    Procedure explained and questions answered to patient or proxy's satisfaction: yes   Location:    Location:  Upper extremity   Upper extremity location:  Hand   Hand location:  R small finger Procedure details:    Wound appearance:  No signs of infection, good wound healing and clean Post-procedure details:    Post-removal:  No dressing applied   Procedure completion:  Tolerated     Medications Ordered in ED Medications - No data to display  ED Course/ Medical Decision Making/ A&P                             Medical Decision Making  Pt here for suture removal.  Well healed no complications        Final Clinical Impression(s) / ED Diagnoses Final diagnoses:  Visit for suture removal    Rx / DC Orders ED Discharge Orders     None         Blanchie Dessert, MD 01/06/23 [phone]

## 2023-04-23 DIAGNOSIS — H43393 Other vitreous opacities, bilateral: Secondary | ICD-10-CM | POA: Diagnosis not present

## 2023-05-04 DIAGNOSIS — N400 Enlarged prostate without lower urinary tract symptoms: Secondary | ICD-10-CM | POA: Diagnosis not present

## 2023-05-27 DIAGNOSIS — Z85828 Personal history of other malignant neoplasm of skin: Secondary | ICD-10-CM | POA: Diagnosis not present

## 2023-05-27 DIAGNOSIS — L821 Other seborrheic keratosis: Secondary | ICD-10-CM | POA: Diagnosis not present

## 2023-05-27 DIAGNOSIS — D225 Melanocytic nevi of trunk: Secondary | ICD-10-CM | POA: Diagnosis not present

## 2023-05-27 DIAGNOSIS — L814 Other melanin hyperpigmentation: Secondary | ICD-10-CM | POA: Diagnosis not present

## 2023-05-27 DIAGNOSIS — Z08 Encounter for follow-up examination after completed treatment for malignant neoplasm: Secondary | ICD-10-CM | POA: Diagnosis not present

## 2023-05-27 DIAGNOSIS — L538 Other specified erythematous conditions: Secondary | ICD-10-CM | POA: Diagnosis not present

## 2023-05-27 DIAGNOSIS — Z872 Personal history of diseases of the skin and subcutaneous tissue: Secondary | ICD-10-CM | POA: Diagnosis not present

## 2023-05-27 DIAGNOSIS — L820 Inflamed seborrheic keratosis: Secondary | ICD-10-CM | POA: Diagnosis not present

## 2023-06-04 ENCOUNTER — Emergency Department (HOSPITAL_COMMUNITY): Payer: Medicare Other

## 2023-06-04 ENCOUNTER — Encounter (HOSPITAL_COMMUNITY): Payer: Self-pay | Admitting: *Deleted

## 2023-06-04 ENCOUNTER — Other Ambulatory Visit: Payer: Self-pay

## 2023-06-04 ENCOUNTER — Emergency Department (HOSPITAL_COMMUNITY)
Admission: EM | Admit: 2023-06-04 | Discharge: 2023-06-04 | Disposition: A | Discharge status: Home / Self Care | Payer: Medicare Other | Attending: Emergency Medicine | Admitting: Emergency Medicine

## 2023-06-04 DIAGNOSIS — I2510 Atherosclerotic heart disease of native coronary artery without angina pectoris: Secondary | ICD-10-CM | POA: Insufficient documentation

## 2023-06-04 DIAGNOSIS — R42 Dizziness and giddiness: Secondary | ICD-10-CM | POA: Diagnosis not present

## 2023-06-04 DIAGNOSIS — R61 Generalized hyperhidrosis: Secondary | ICD-10-CM | POA: Diagnosis not present

## 2023-06-04 DIAGNOSIS — I10 Essential (primary) hypertension: Secondary | ICD-10-CM | POA: Diagnosis not present

## 2023-06-04 DIAGNOSIS — Z87891 Personal history of nicotine dependence: Secondary | ICD-10-CM | POA: Insufficient documentation

## 2023-06-04 DIAGNOSIS — Z7982 Long term (current) use of aspirin: Secondary | ICD-10-CM | POA: Insufficient documentation

## 2023-06-04 DIAGNOSIS — U071 COVID-19: Secondary | ICD-10-CM | POA: Diagnosis not present

## 2023-06-04 DIAGNOSIS — R55 Syncope and collapse: Secondary | ICD-10-CM | POA: Diagnosis not present

## 2023-06-04 DIAGNOSIS — R0602 Shortness of breath: Secondary | ICD-10-CM | POA: Diagnosis not present

## 2023-06-04 DIAGNOSIS — R001 Bradycardia, unspecified: Secondary | ICD-10-CM | POA: Diagnosis not present

## 2023-06-04 DIAGNOSIS — I959 Hypotension, unspecified: Secondary | ICD-10-CM | POA: Diagnosis not present

## 2023-06-04 DIAGNOSIS — Z951 Presence of aortocoronary bypass graft: Secondary | ICD-10-CM | POA: Insufficient documentation

## 2023-06-04 DIAGNOSIS — Z1152 Encounter for screening for COVID-19: Secondary | ICD-10-CM | POA: Diagnosis not present

## 2023-06-04 DIAGNOSIS — R0989 Other specified symptoms and signs involving the circulatory and respiratory systems: Secondary | ICD-10-CM | POA: Diagnosis not present

## 2023-06-04 DIAGNOSIS — R0600 Dyspnea, unspecified: Secondary | ICD-10-CM | POA: Diagnosis not present

## 2023-06-04 DIAGNOSIS — Z79899 Other long term (current) drug therapy: Secondary | ICD-10-CM | POA: Diagnosis not present

## 2023-06-04 DIAGNOSIS — K449 Diaphragmatic hernia without obstruction or gangrene: Secondary | ICD-10-CM | POA: Diagnosis not present

## 2023-06-04 DIAGNOSIS — R0981 Nasal congestion: Secondary | ICD-10-CM | POA: Diagnosis not present

## 2023-06-04 LAB — CBC
HCT: 49.1 % (ref 39.0–52.0)
Hemoglobin: 16.1 g/dL (ref 13.0–17.0)
MCH: 32.1 pg (ref 26.0–34.0)
MCHC: 32.8 g/dL (ref 30.0–36.0)
MCV: 98 fL (ref 80.0–100.0)
Platelets: 118 10*3/uL — ABNORMAL LOW (ref 150–400)
RBC: 5.01 MIL/uL (ref 4.22–5.81)
RDW: 12.1 % (ref 11.5–15.5)
WBC: 6 10*3/uL (ref 4.0–10.5)
nRBC: 0 % (ref 0.0–0.2)

## 2023-06-04 LAB — BASIC METABOLIC PANEL
Anion gap: 15 (ref 5–15)
BUN: 8 mg/dL (ref 8–23)
CO2: 19 mmol/L — ABNORMAL LOW (ref 22–32)
Calcium: 8.1 mg/dL — ABNORMAL LOW (ref 8.9–10.3)
Chloride: 105 mmol/L (ref 98–111)
Creatinine, Ser: 1.17 mg/dL (ref 0.61–1.24)
GFR, Estimated: >60 mL/min (ref >60)
Glucose, Bld: 112 mg/dL — ABNORMAL HIGH (ref 70–99)
Potassium: 4.1 mmol/L (ref 3.5–5.1)
Sodium: 139 mmol/L (ref 135–145)

## 2023-06-04 LAB — TROPONIN I (HIGH SENSITIVITY)
Troponin I (High Sensitivity): 5 ng/L (ref <18)
Troponin I (High Sensitivity): 5 ng/L (ref <18)

## 2023-06-04 MED ORDER — PAXLOVID (300/100) 20 X 150 MG & 10 X 100MG PO TBPK: 3.0000 | ORAL_TABLET | Freq: Two times a day (BID) | ORAL | 0 refills | Status: AC

## 2023-06-04 MED ORDER — PAXLOVID (300/100) 20 X 150 MG & 10 X 100MG PO TBPK: 3.0000 | ORAL_TABLET | Freq: Two times a day (BID) | ORAL | 0 refills | Status: DC

## 2023-06-04 MED ORDER — SODIUM CHLORIDE 0.9 % IV BOLUS
500.0000 mL | Freq: Once | INTRAVENOUS | Status: AC
  Administered 2023-06-04: 500 mL via INTRAVENOUS

## 2023-06-04 NOTE — ED Provider Notes (Cosign Needed Addendum)
[place] EMERGENCY DEPARTMENT AT [facility] Provider Note   CSN: [id number] Arrival date & time: 06/04/23  1015     History  Chief Complaint  Patient presents with   Near Syncope    Ruben Burton is a 82 y.o. male.   Near Syncope   82 year old male presents emergency department with complaints of episode of near syncope.  Patient states for the past 2 days has had mild cough, sore throat as well as mild headache.  He was at his primary care's office where he tested positive for COVID.  States that he stood up to get an x-ray of his chest for concerns for possible pneumonia when he began to feel lightheaded.  States that he became sweaty and felt as if he were going to pass out.  States that he subsequently sat down and was tended to with ice packs on his neck.  States that he felt symptoms for the next 20 to 30 minutes before they resolved spontaneously.  Was transported by EMS with initial blood pressure readings of 90/50 with CBG of 111.  Patient states that he feels like his baseline now and is complaining of mildly sore throat as well as headache.  States that he has had some feelings of chest tightness over the past 2 days that has been intermittent.  States the feelings of chest tightness have been exacerbated by coughing but does not report any other known exacerbating or relieving factors.  States that he was not having any chest tightness during episode earlier today.  Denies any known fever/chills, abdominal pain, nausea, vomiting, urinary symptoms, change in bowel habits.  Past medical history significant for paroxysmal atrial fibrillation on no anticoagulation with no known occurrences since 2011, hyperlipidemia, hypertension, CAD with CABG,  Home Medications Prior to Admission medications   Medication Sig Start Date End Date Taking? Authorizing Provider  aspirin EC 81 MG tablet Take 1 tablet (81 mg total) by mouth every evening. 09/17/19   Rehman, Joline Maxcy, MD  B  Complex-C (B-COMPLEX WITH VITAMIN C) tablet Take 1 tablet by mouth daily after breakfast.    [provider]  calcium carbonate (OS-CAL) 1250 (500 Ca) MG chewable tablet Chew 1 tablet by mouth 2 (two) times daily.     [provider]  metoprolol (LOPRESSOR) 25 MG tablet Take one tablet by mouth twice daily. 05/09/11   Kathleene Hazel, MD  nirmatrelvir & ritonavir (PAXLOVID, 300/100,) 20 x 150 MG & 10 x 100MG  TBPK Take 3 tablets by mouth 2 (two) times daily for 5 days. 06/04/23 06/09/23  Peter Garter, PA  nitroGLYCERIN (NITROSTAT) 0.4 MG SL tablet Place 1 tablet (0.4 mg total) under the tongue every 5 (five) minutes as needed for chest pain. 01/10/14   Kathleene Hazel, MD  rosuvastatin (CRESTOR) 20 MG tablet TAKE 1 TABLET DAILY 07/26/18   Kathleene Hazel, MD  tamsulosin (FLOMAX) 0.4 MG CAPS capsule Take 0.4 mg by mouth daily.  07/07/16   [provider]      Allergies    Omeprazole, Pantoprazole, and Oxytetracycline    Review of Systems   Review of Systems  Cardiovascular:  Positive for near-syncope.  All other systems reviewed and are negative.   Physical Exam Updated Vital Signs BP 137/86   Pulse 62   Temp 98.2 F (36.8 C) (Oral)   Resp 20   Ht 6\' 1"  (1.854 m)   Wt 83.9 kg   SpO2 99%   BMI  24.41 kg/m  Physical Exam Vitals and nursing note reviewed.  Constitutional:      General: He is not in acute distress.    Appearance: He is well-developed.  HENT:     Head: Normocephalic and atraumatic.  Eyes:     Conjunctiva/sclera: Conjunctivae normal.  Cardiovascular:     Rate and Rhythm: Normal rate and regular rhythm.  Pulmonary:     Effort: Pulmonary effort is normal. No respiratory distress.     Breath sounds: Normal breath sounds.  Abdominal:     Palpations: Abdomen is soft.     Tenderness: There is no abdominal tenderness.  Musculoskeletal:        General: No swelling.     Cervical back: Neck supple.     Right lower leg: No  edema.     Left lower leg: No edema.  Skin:    General: Skin is warm and dry.     Capillary Refill: Capillary refill takes less than 2 seconds.  Neurological:     Mental Status: He is alert.     Comments: Alert and oriented to self, place, time and event.   Speech is fluent, clear without dysarthria or dysphasia.   Strength 5/5 in upper/lower extremities   Sensation intact in upper/lower extremities   Normal gait.  CN I not tested  CN II not tested CN III, IV, VI PERRLA and EOMs intact bilaterally  CN V Intact sensation to sharp and light touch to the face  CN VII facial movements symmetric  CN VIII not tested  CN IX, X no uvula deviation, symmetric rise of soft palate  CN XI 5/5 SCM and trapezius strength bilaterally  CN XII Midline tongue protrusion, symmetric L/R movements     Psychiatric:        Mood and Affect: Mood normal.     ED Results / Procedures / Treatments   Labs (all labs ordered are listed, but only abnormal results are displayed) Labs Reviewed  BASIC METABOLIC PANEL - Abnormal; Notable for the following components:      Result Value   CO2 19 (*)    Glucose, Bld 112 (*)    Calcium 8.1 (*)    All other components within normal limits  CBC - Abnormal; Notable for the following components:   Platelets 118 (*)    All other components within normal limits  TROPONIN I (HIGH SENSITIVITY)  TROPONIN I (HIGH SENSITIVITY)    EKG EKG Interpretation Date/Time:  Thursday June 04 2023 10:27:23 EDT Ventricular Rate:  56 PR Interval:  197 QRS Duration:  124 QT Interval:  473 QTC Calculation: 457 R Axis:   -15  Text Interpretation: Sinus rhythm IVCD, consider atypical LBBB No significant change since last tracing Confirmed by Linwood Dibbles [phone]) on 06/04/2023 11:13:23 AM  Radiology DG Chest 1 View  Result Date: 06/04/2023 CLINICAL DATA:  Shortness of breath, near syncope. EXAM: CHEST  1 VIEW COMPARISON:  October 16, 2016. FINDINGS: The heart size and  mediastinal contours are within normal limits. Sternotomy wires are noted. Stable hiatal hernia. Both lungs are clear. The visualized skeletal structures are unremarkable. IMPRESSION: No active disease. Electronically Signed   By: Lupita Raider M.D.   On: 06/04/2023 11:07    Procedures Procedures    Medications Ordered in ED Medications  sodium chloride 0.9 % bolus 500 mL (0 mLs Intravenous Stopped 06/04/23 1456)    ED Course/ Medical Decision Making/ A&P  Medical Decision Making Amount and/or Complexity of Data Reviewed Labs: ordered. Radiology: ordered.  Risk Prescription drug management.   This patient presents to the ED for concern of near syncope, this involves an extensive number of treatment options, and is a complaint that carries with it a high risk of complications and morbidity.  The differential diagnosis includes seizure, orthostatic hypotension, vasovagal, CVA, carotid artery stenosis/occlusion, arrhythmia, valvular disorder, dehydration, anemia, PE, ACS   Co morbidities that complicate the patient evaluation  See HPI   Additional history obtained:  Additional history obtained from EMR External records from outside source obtained and reviewed including hospital records   Lab Tests:  I Ordered, and personally interpreted labs.  The pertinent results include: No leukocytosis.  No evidence of anemia.  Thrombocytosis of 118.  Mild decrease in bicarb and hypocalcemia of 8.1 but otherwise no electrolyte abnormalities.  No renal dysfunction.  INitial troponin of 5 with repeat 5   Imaging Studies ordered:  I ordered imaging studies including chest x-ray I independently visualized and interpreted imaging which showed no acute cardiopulmonary abnormalities I agree with the radiologist interpretation   Cardiac Monitoring: / EKG:  The patient was maintained on a cardiac monitor.  I personally viewed and interpreted the cardiac  monitored which showed an underlying rhythm of: Sinus rhythm without acute ischemic change from prior EKG performed   Consultations Obtained:  I requested consultation with attending physician Dr. Lynelle Doctor who independently evaluated the patient and was in agreement with  treatment plan going forward  Problem List / ED Course / Critical interventions / Medication management  Near syncope, COVID I ordered medication including 500 cc normal saline   Reevaluation of the patient after these medicines showed that the patient improved I have reviewed the patients home medicines and have made adjustments as needed   Social Determinants of Health:  Former cigarette use.  Denies illicit drug use.   Test / Admission - Considered:  Near syncope, COVID Vitals signs within normal range and stable throughout visit. Laboratory/imaging studies significant for: See above 82 year old male presents emergency department with complaints of near syncopal episode.  Episode occurred after standing up from a seated position and lasted several minutes before spontaneous resolution.  Initially when EMS found patient, he was hypotensive with blood pressure 92/50 but since arrival in the emergency department, has been normotensive without repeat episodes of near syncope.  Patient's workup today overall reassuring.  Patient with delta negative troponin, lack of acute ischemic change on EKG.  Low suspicion for ACS.  Patient without appreciable arrhythmia on EKG or cardiac monitoring throughout patient's 5+ hour stay while in the ED.  Patient without evidence of anemia with normal hemoglobin.  Patient without risk factors for DVT/PE, evidence of tachycardia, hypoxia, tachypnea, persistent chest tightness, shortness of breath; low suspicion for PE.  I suspect patient's symptoms likely secondary to orthostatic hypotension.  Patient bolused with 500 cc of fluids on the emergency department and able to change positions from  laying to sitting to standing as well as walking around emergency department without repeat symptoms.  Regarding patient's current COVID infection, will prescribe Paxlovid and recommend further symptomatic therapy as described in AVS.  Will recommend getting up slowly from a lying/seated position.  Also recommend close follow-up with primary care in the outpatient setting.  Treatment plan discussed at length with patient and he acknowledged understanding was agreeable to said plan. Worrisome signs and symptoms were discussed with the patient, and the patient acknowledged understanding to  return to the ED if noticed. Patient was stable upon discharge.          Final Clinical Impression(s) / ED Diagnoses Final diagnoses:  Near syncope    Rx / DC Orders ED Discharge Orders          Ordered    nirmatrelvir & ritonavir (PAXLOVID, 300/100,) 20 x 150 MG & 10 x 100MG  TBPK  2 times daily,   Status:  Discontinued        06/04/23 1459    nirmatrelvir & ritonavir (PAXLOVID, 300/100,) 20 x 150 MG & 10 x 100MG  TBPK  2 times daily        06/04/23 1506                 Peter Garter, [place] 06/05/23 1006    Linwood Dibbles, MD 06/08/23 [phone]

## 2023-06-04 NOTE — Discharge Instructions (Addendum)
As discussed, your workup today was overall reassuring.  Heart enzymes are normal.  EKG appears normal.  Your chest x-ray was without signs of pneumonia or abnormality.  I suspect your near passing out episode was likely secondary to low blood pressure or something called orthostatic hypotension.  Regarding current COVID infection, we will treat it with Paxlovid which is an antiviral.  Recommend additional symptomatic therapy at home with Tylenol for pain/fever, nasal steroid spray such as Nasacort/Flonase for runny nose, Cepacol throat lozenges for sore throat.  Recommend follow-up with primary care in the outpatient setting for reevaluation of your symptoms.  Please do not hesitate to return to emergency department if the worrisome signs and symptoms were discussed to become apparent.

## 2023-06-04 NOTE — ED Triage Notes (Signed)
Pt arrives from the doctors office via [facility]. Per report, pt had a near syncopal event while standing to take and xray, became diaphoretic, sob and nausea. Pt +COVID. Denies CP Initial bp 92/50, hr 40, cbg 111, 95% ra. Last vitals 130/70, hr 57. IV established in the left [facility].

## 2023-06-04 NOTE — ED Notes (Signed)
Pt able to ambulate at this time without any symptoms of dizzyness or light headed

## 2023-06-04 NOTE — ED Triage Notes (Signed)
PT says that he has had sore throat, headache and chest discomfort that started yesterday- went to his doctor this morning for evaluation, +COVID. PT says when he stood up he felt sweaty and "swimmy headed". He was able to sit down in a chair.

## 2023-06-10 ENCOUNTER — Telehealth: Payer: Self-pay

## 2023-06-10 NOTE — Telephone Encounter (Signed)
Transition Care Management Follow-up Telephone Call Date of discharge and from where: 06/04/2023 [facility] How have you been since you were released from the hospital? Patient stated he is feeling much better. Any questions or concerns? No  Items Reviewed: Did the pt receive and understand the discharge instructions provided? Yes  Medications obtained and verified? Yes  Other? No  Any new allergies since your discharge? No  Dietary orders reviewed? Yes Do you have support at home? Yes   Follow up appointments reviewed:  PCP Hospital f/u appt confirmed? No  Scheduled to see  on  @ . Specialist Hospital f/u appt confirmed? Yes  Scheduled to see Kathleene Hazel, MD on 08/03/2023 @ [facility]. Are transportation arrangements needed? No  If their condition worsens, is the pt aware to call PCP or go to the Emergency Dept.? Yes Was the patient provided with contact information for the PCP's office or ED? Yes Was to pt encouraged to call back with questions or concerns? Yes  Prapti Grussing [facility]  [facility] Population Health Community Resource Care Guide   [email or web address]  ?? [phone]   Website: [email or web address]  [email or web address]

## 2023-06-17 DIAGNOSIS — U071 COVID-19: Secondary | ICD-10-CM | POA: Diagnosis not present

## 2023-06-17 DIAGNOSIS — J019 Acute sinusitis, unspecified: Secondary | ICD-10-CM | POA: Diagnosis not present

## 2023-06-23 DIAGNOSIS — H838X3 Other specified diseases of inner ear, bilateral: Secondary | ICD-10-CM | POA: Diagnosis not present

## 2023-06-23 DIAGNOSIS — H608X3 Other otitis externa, bilateral: Secondary | ICD-10-CM | POA: Diagnosis not present

## 2023-06-23 DIAGNOSIS — H903 Sensorineural hearing loss, bilateral: Secondary | ICD-10-CM | POA: Diagnosis not present

## 2023-06-23 DIAGNOSIS — H6123 Impacted cerumen, bilateral: Secondary | ICD-10-CM | POA: Diagnosis not present

## 2023-07-08 ENCOUNTER — Encounter: Payer: Self-pay | Admitting: Cardiovascular Disease

## 2023-07-08 ENCOUNTER — Ambulatory Visit: Payer: Medicare Other | Attending: Cardiovascular Disease | Admitting: Cardiovascular Disease

## 2023-07-08 VITALS — BP 110/67 | HR 60 | Ht 73.0 in | Wt 200.4 lb

## 2023-07-08 DIAGNOSIS — I25810 Atherosclerosis of coronary artery bypass graft(s) without angina pectoris: Secondary | ICD-10-CM

## 2023-07-08 DIAGNOSIS — I10 Essential (primary) hypertension: Secondary | ICD-10-CM

## 2023-07-08 DIAGNOSIS — I480 Paroxysmal atrial fibrillation: Secondary | ICD-10-CM

## 2023-07-08 DIAGNOSIS — E7800 Pure hypercholesterolemia, unspecified: Secondary | ICD-10-CM | POA: Diagnosis not present

## 2023-07-08 NOTE — Patient Instructions (Signed)

## 2023-07-08 NOTE — Progress Notes (Signed)
Chief Complaint  Patient presents with   Follow-up    CAD   History of Present Illness: 82 yo male with history of CAD s/p CABG in 2011, HTN, HLD and post-op atrial fibrillation here today for follow up.  He was admitted in April 2011 with a NSTEMI and was found to have severe disease in the LAD and Diagonal. He underwent 2V CABG with LIMA to LAD and SVG to Diagonal. Postop course was complicated by atrial fibrillation. Echo 2011 with LVEF 55-60%, mildly dilated RV. No evidence of atrial fib on post-op cardiac monitor in 2011.  Coumadin was discontinued. Stress myoview February 2016 with no ischemia. PVCs noted on EKG during stress. Admitted to [facility] December 2017 with chest pain. Cardiac cath 10/16/16 with stable CAD (occluded mid LAD and Diagonal with patent LIMA to LAD and patent SVG to Diagonal). The RCA had mild to moderate plaque. The Circumflex has no obstructive disease. PVCs noted on monitor during hospitalization. Echo December 2018 with normal LV systolic function, grade 2 diastolic dysfunction, no valve disease. He was seen in our office 01/14/21 by Chelsea Aus, PA-C with c/o dizziness. It was felt to be related to vertigo. Carotid artery dopplers with no significant carotid artery disease. Possible disease in the right vertebral artery.   He is here today for follow up. The patient denies any chest pain, dyspnea, palpitations, lower extremity edema, orthopnea, PND, dizziness, near syncope or syncope.   Primary Care Physician: Cleatis Polka., MD  Past Medical History:  Diagnosis Date   Arthritis    "mild; fingers" (10/16/2016)   CAD, ARTERY BYPASS GRAFT 03/07/2010   Qualifier: Diagnosis of  By: Denyse Amass CMA, Carol     Chest pain 10/16/2016   Coronary artery disease    a. CABG in 2011 b. Cath 10/16/16 - chronic total occlusion of mid LAD with patietn LIMA to mid LAD; severe ostial stenosis to large diagonal with patent SVG to diagonal; moderate non obstrctive RCA, normal LV  function. Tx Rx   GERD 03/07/2010   Qualifier: Diagnosis of  By: Denyse Amass, CMA, Carol     GERD (gastroesophageal reflux disease)    Hx of cardiovascular stress test    a. ETT-MV 1/14: no ischemia; not gated   Hyperlipidemia    HYPERLIPIDEMIA 03/07/2010   Qualifier: Diagnosis of  By: Denyse Amass CMA, Carol     Hypertension    HYPERTENSION 03/07/2010   Qualifier: Diagnosis of  By: Denyse Amass CMA, Carol     PAROXYSMAL ATRIAL FIBRILLATION 03/07/2010   Qualifier: Diagnosis of  By: Denyse Amass CMA, Carol     Paroxysmal atrial fibrillation (HCC)    Pleural effusion    PLEURAL EFFUSION, LEFT 03/07/2010   Qualifier: Diagnosis of  By: Denyse Amass, CMA, Carol     Precordial pain    Unstable angina (HCC) 10/16/2016   Unstable angina pectoris (HCC) 10/16/2016    Past Surgical History:  Procedure Laterality Date   BIOPSY  09/16/2019   Procedure: BIOPSY;  Surgeon: Malissa Hippo, MD;  [facility]: [facility];  Service: Endoscopy;;  gastric    CARDIAC CATHETERIZATION  ~ 2011; 10/16/2016   CARDIAC CATHETERIZATION N/A 10/16/2016   Procedure: Left Heart Cath and Cors/Grafts Angiography;  Surgeon: Kathleene Hazel, MD;  Location: [facility] INVASIVE CV LAB;  Service: Cardiovascular;  Laterality: N/A;   CHOLECYSTECTOMY OPEN  ~ 1992   COLONOSCOPY N/A 12/01/2013   Procedure: COLONOSCOPY;  Surgeon: Malissa Hippo, MD;  Location: [facility];  Service: Endoscopy;  Laterality: N/A;  830   COLONOSCOPY WITH PROPOFOL N/A 09/16/2019   Procedure: COLONOSCOPY WITH PROPOFOL;  Surgeon: Malissa Hippo, MD;  Location: [facility];  Service: Endoscopy;  Laterality: N/A;   CORONARY ARTERY BYPASS GRAFT  02-13-10   2V (LIMA to LAD, SVG to diagonal)   ESOPHAGOGASTRODUODENOSCOPY (EGD) WITH ESOPHAGEAL DILATION     ESOPHAGOGASTRODUODENOSCOPY (EGD) WITH PROPOFOL N/A 09/16/2019   Procedure: ESOPHAGOGASTRODUODENOSCOPY (EGD) WITH PROPOFOL;  Surgeon: Malissa Hippo, MD;  Location: [facility];  Service: Endoscopy;  Laterality: N/A;  955am    EXPLORATORY LAPAROTOMY  ~ 1995   for perforated duodenal ulcer   HYDROCELE EXCISION     POLYPECTOMY  09/16/2019   Procedure: POLYPECTOMY;  Surgeon: Malissa Hippo, MD;  Location: [facility];  Service: Endoscopy;;  colon    Current Outpatient Medications  Medication Sig Dispense Refill   aspirin EC 81 MG tablet Take 1 tablet (81 mg total) by mouth every evening.     B Complex-C (B-COMPLEX WITH VITAMIN C) tablet Take 1 tablet by mouth daily after breakfast.     calcium carbonate (OS-CAL) 1250 (500 Ca) MG chewable tablet Chew 1 tablet by mouth 2 (two) times daily.      metoprolol (LOPRESSOR) 25 MG tablet Take one tablet by mouth twice daily. 180 tablet 3   nitroGLYCERIN (NITROSTAT) 0.4 MG SL tablet Place 1 tablet (0.4 mg total) under the tongue every 5 (five) minutes as needed for chest pain. 25 tablet 11   rosuvastatin (CRESTOR) 20 MG tablet TAKE 1 TABLET DAILY 90 tablet 2   tamsulosin (FLOMAX) 0.4 MG CAPS capsule Take 0.4 mg by mouth daily.      No current facility-administered medications for this visit.    Allergies  Allergen Reactions   Omeprazole Nausea Only   Pantoprazole Nausea Only   Oxytetracycline Other (See Comments)    Passes out.    Social History   Socioeconomic History   Marital status: Married    Spouse name: Not on file   Number of children: 2   Years of education: Not on file   Highest education level: Not on file  Occupational History   Occupation: retirede    Employer: RETIRED  Tobacco Use   Smoking status: Former    Current packs/day: 0.00    Average packs/day: 0.3 packs/day for 1 year (0.3 ttl pk-yrs)    Types: Cigarettes    Start date: 62    Quit date: 66    Years since quitting: 64.7   Smokeless tobacco: Never  Vaping Use   Vaping status: Never Used  Substance and Sexual Activity   Alcohol use: No   Drug use: No   Sexual activity: Yes  Other Topics Concern   Not on file  Social History Narrative   Not on file   Social  Determinants of Health   Financial Resource Strain: Not on file  Food Insecurity: Not on file  Transportation Needs: Not on file  Physical Activity: Not on file  Stress: Not on file  Social Connections: Not on file  Intimate Partner Violence: Not on file    Family History  Problem Relation Age of Onset   Other Mother        alive- healthy   Hypertension Mother    Stroke Father        deceased   Hypertension Father    Other Brother        CABG   Heart attack Brother  Hypertension Brother    Colon cancer Neg Hx     Review of Systems:  As stated in the HPI and otherwise negative.   BP 110/67   Pulse 60   Ht 6\' 1"  (1.854 m)   Wt 90.9 kg   SpO2 97%   BMI 26.44 kg/m   Physical Examination: General: Well developed, well nourished, NAD  HEENT: OP clear, mucus membranes moist  SKIN: warm, dry. No rashes. Neuro: No focal deficits  Musculoskeletal: Muscle strength 5/5 all ext  Psychiatric: Mood and affect normal  Neck: No JVD, no carotid bruits, no thyromegaly, no lymphadenopathy.  Lungs:Clear bilaterally, no wheezes, rhonci, crackles Cardiovascular: Regular rate and rhythm. No murmurs, gallops or rubs. Abdomen:Soft. Bowel sounds present. Non-tender.  Extremities: No lower extremity edema. Pulses are 2 + in the bilateral DP/PT.  Cardiac cath December 2017: Diagnostic Diagram        Echo 10/19/17: Left ventricle: The cavity size was normal. Wall thickness was   normal. Systolic function was normal. The estimated ejection   fraction was in the range of 60% to 65%. Wall motion was normal;   there were no regional wall motion abnormalities. Features are   consistent with a pseudonormal left ventricular filling pattern,   with concomitant abnormal relaxation and increased filling   pressure (grade 2 diastolic dysfunction).  EKG:  EKG is not ordered today. The ekg ordered today demonstrates  EKG from 06/04/23 with sinus  Recent Labs: 06/04/2023: BUN 8; Creatinine,  Ser 1.17; Hemoglobin 16.1; Platelets 118; Potassium 4.1; Sodium 139   Lipids: followed in primary care:   Wt Readings from Last 3 Encounters:  07/08/23 90.9 kg  06/04/23 83.9 kg  12/26/22 89.4 kg    Assessment and Plan:   1. Coronary Artery Disease without angina: He is s/p CABG in 2011. Cardiac cath in December 2017 with stable CAD with patent bypass grafts. Echo December 2018 with normal LV systolic function. No chest pain suggestive of angina. Will continue ASA, statin and beta blocker.     2. Hypertension: BP is well controlled. No changes  3. Hyperlipidemia: Lipids managed in primary care. LDL 33 October 2023. Continue statin.   4. Paroxysmal atrial fibrillation: He has had no known recurrences of atrial fibrillation since his post-op period in 2011. Continue Lopressor.   Labs/ tests ordered today include:  No orders of the defined types were placed in this encounter.  Disposition:   F/U with me in 12 months  Signed, Verne Carrow, MD 07/08/2023 3:51 PM    [facility] [address] [place], [place], [place]  [address] Phone: [phone]; Fax: [phone]

## 2023-08-03 ENCOUNTER — Ambulatory Visit: Payer: Medicare Other | Admitting: Cardiovascular Disease

## 2023-09-16 DIAGNOSIS — Z1152 Encounter for screening for COVID-19: Secondary | ICD-10-CM | POA: Diagnosis not present

## 2023-09-16 DIAGNOSIS — G43909 Migraine, unspecified, not intractable, without status migrainosus: Secondary | ICD-10-CM | POA: Diagnosis not present

## 2023-09-16 DIAGNOSIS — B349 Viral infection, unspecified: Secondary | ICD-10-CM | POA: Diagnosis not present

## 2023-09-16 DIAGNOSIS — J029 Acute pharyngitis, unspecified: Secondary | ICD-10-CM | POA: Diagnosis not present

## 2023-09-16 DIAGNOSIS — R051 Acute cough: Secondary | ICD-10-CM | POA: Diagnosis not present

## 2023-09-16 DIAGNOSIS — R5383 Other fatigue: Secondary | ICD-10-CM | POA: Diagnosis not present

## 2023-09-16 DIAGNOSIS — I10 Essential (primary) hypertension: Secondary | ICD-10-CM | POA: Diagnosis not present

## 2023-09-16 DIAGNOSIS — R0981 Nasal congestion: Secondary | ICD-10-CM | POA: Diagnosis not present

## 2023-12-02 DIAGNOSIS — I10 Essential (primary) hypertension: Secondary | ICD-10-CM | POA: Diagnosis not present

## 2023-12-02 DIAGNOSIS — J069 Acute upper respiratory infection, unspecified: Secondary | ICD-10-CM | POA: Diagnosis not present

## 2023-12-02 DIAGNOSIS — R051 Acute cough: Secondary | ICD-10-CM | POA: Diagnosis not present

## 2023-12-09 DIAGNOSIS — E538 Deficiency of other specified B group vitamins: Secondary | ICD-10-CM | POA: Diagnosis not present

## 2023-12-09 DIAGNOSIS — M8580 Other specified disorders of bone density and structure, unspecified site: Secondary | ICD-10-CM | POA: Diagnosis not present

## 2023-12-09 DIAGNOSIS — E039 Hypothyroidism, unspecified: Secondary | ICD-10-CM | POA: Diagnosis not present

## 2023-12-09 DIAGNOSIS — E785 Hyperlipidemia, unspecified: Secondary | ICD-10-CM | POA: Diagnosis not present

## 2023-12-09 DIAGNOSIS — N401 Enlarged prostate with lower urinary tract symptoms: Secondary | ICD-10-CM | POA: Diagnosis not present

## 2023-12-09 DIAGNOSIS — I10 Essential (primary) hypertension: Secondary | ICD-10-CM | POA: Diagnosis not present

## 2023-12-09 DIAGNOSIS — R7301 Impaired fasting glucose: Secondary | ICD-10-CM | POA: Diagnosis not present

## 2023-12-16 DIAGNOSIS — R972 Elevated prostate specific antigen [PSA]: Secondary | ICD-10-CM | POA: Diagnosis not present

## 2023-12-16 DIAGNOSIS — E785 Hyperlipidemia, unspecified: Secondary | ICD-10-CM | POA: Diagnosis not present

## 2023-12-16 DIAGNOSIS — Z1331 Encounter for screening for depression: Secondary | ICD-10-CM | POA: Diagnosis not present

## 2023-12-16 DIAGNOSIS — Z23 Encounter for immunization: Secondary | ICD-10-CM | POA: Diagnosis not present

## 2023-12-16 DIAGNOSIS — I700 Atherosclerosis of aorta: Secondary | ICD-10-CM | POA: Diagnosis not present

## 2023-12-16 DIAGNOSIS — I872 Venous insufficiency (chronic) (peripheral): Secondary | ICD-10-CM | POA: Diagnosis not present

## 2023-12-16 DIAGNOSIS — R82998 Other abnormal findings in urine: Secondary | ICD-10-CM | POA: Diagnosis not present

## 2023-12-16 DIAGNOSIS — E039 Hypothyroidism, unspecified: Secondary | ICD-10-CM | POA: Diagnosis not present

## 2023-12-16 DIAGNOSIS — I10 Essential (primary) hypertension: Secondary | ICD-10-CM | POA: Diagnosis not present

## 2023-12-16 DIAGNOSIS — M8580 Other specified disorders of bone density and structure, unspecified site: Secondary | ICD-10-CM | POA: Diagnosis not present

## 2023-12-16 DIAGNOSIS — N401 Enlarged prostate with lower urinary tract symptoms: Secondary | ICD-10-CM | POA: Diagnosis not present

## 2023-12-16 DIAGNOSIS — R7301 Impaired fasting glucose: Secondary | ICD-10-CM | POA: Diagnosis not present

## 2023-12-16 DIAGNOSIS — Z1339 Encounter for screening examination for other mental health and behavioral disorders: Secondary | ICD-10-CM | POA: Diagnosis not present

## 2023-12-16 DIAGNOSIS — K219 Gastro-esophageal reflux disease without esophagitis: Secondary | ICD-10-CM | POA: Diagnosis not present

## 2023-12-16 DIAGNOSIS — I25810 Atherosclerosis of coronary artery bypass graft(s) without angina pectoris: Secondary | ICD-10-CM | POA: Diagnosis not present

## 2023-12-16 DIAGNOSIS — Z0000 Encounter for general adult medical examination without abnormal findings: Secondary | ICD-10-CM | POA: Diagnosis not present

## 2023-12-18 DIAGNOSIS — R972 Elevated prostate specific antigen [PSA]: Secondary | ICD-10-CM | POA: Diagnosis not present

## 2023-12-18 DIAGNOSIS — N400 Enlarged prostate without lower urinary tract symptoms: Secondary | ICD-10-CM | POA: Diagnosis not present

## 2023-12-23 ENCOUNTER — Other Ambulatory Visit: Payer: Self-pay | Admitting: Urology

## 2023-12-23 DIAGNOSIS — R972 Elevated prostate specific antigen [PSA]: Secondary | ICD-10-CM

## 2023-12-29 ENCOUNTER — Ambulatory Visit
Admission: RE | Admit: 2023-12-29 | Discharge: 2023-12-29 | Disposition: A | Discharge status: Home / Self Care | Payer: Medicare Other | Source: Ambulatory Visit | Attending: Urology | Admitting: Urology

## 2023-12-29 DIAGNOSIS — R972 Elevated prostate specific antigen [PSA]: Secondary | ICD-10-CM

## 2023-12-29 MED ORDER — GADOPICLENOL 0.5 MMOL/ML IV SOLN
9.0000 mL | Freq: Once | INTRAVENOUS | Status: AC | PRN
Start: 2023-12-29 — End: 2023-12-29
  Administered 2023-12-29: 9 mL via INTRAVENOUS

## 2024-01-15 ENCOUNTER — Emergency Department (HOSPITAL_BASED_OUTPATIENT_CLINIC_OR_DEPARTMENT_OTHER): Admission: EM | Admit: 2024-01-15 | Discharge: 2024-01-15 | Disposition: A | Discharge status: Home / Self Care

## 2024-01-15 ENCOUNTER — Other Ambulatory Visit: Payer: Self-pay

## 2024-01-15 ENCOUNTER — Encounter (HOSPITAL_BASED_OUTPATIENT_CLINIC_OR_DEPARTMENT_OTHER): Payer: Self-pay | Admitting: Emergency Medicine

## 2024-01-15 ENCOUNTER — Emergency Department (HOSPITAL_BASED_OUTPATIENT_CLINIC_OR_DEPARTMENT_OTHER): Admitting: Radiology

## 2024-01-15 ENCOUNTER — Emergency Department (HOSPITAL_BASED_OUTPATIENT_CLINIC_OR_DEPARTMENT_OTHER)

## 2024-01-15 DIAGNOSIS — M79632 Pain in left forearm: Secondary | ICD-10-CM | POA: Insufficient documentation

## 2024-01-15 DIAGNOSIS — W010XXA Fall on same level from slipping, tripping and stumbling without subsequent striking against object, initial encounter: Secondary | ICD-10-CM | POA: Insufficient documentation

## 2024-01-15 DIAGNOSIS — M19032 Primary osteoarthritis, left wrist: Secondary | ICD-10-CM | POA: Diagnosis not present

## 2024-01-15 DIAGNOSIS — S5292XA Unspecified fracture of left forearm, initial encounter for closed fracture: Secondary | ICD-10-CM | POA: Diagnosis not present

## 2024-01-15 DIAGNOSIS — Z7982 Long term (current) use of aspirin: Secondary | ICD-10-CM | POA: Insufficient documentation

## 2024-01-15 DIAGNOSIS — M7989 Other specified soft tissue disorders: Secondary | ICD-10-CM | POA: Diagnosis not present

## 2024-01-15 DIAGNOSIS — S6992XA Unspecified injury of left wrist, hand and finger(s), initial encounter: Secondary | ICD-10-CM | POA: Diagnosis present

## 2024-01-15 DIAGNOSIS — S62115A Nondisplaced fracture of triquetrum [cuneiform] bone, left wrist, initial encounter for closed fracture: Secondary | ICD-10-CM | POA: Diagnosis not present

## 2024-01-15 DIAGNOSIS — M25532 Pain in left wrist: Secondary | ICD-10-CM | POA: Diagnosis not present

## 2024-01-15 NOTE — Discharge Instructions (Addendum)
 Today you are seen for a triquetrum fracture of your left hand.  Please follow-up with orthopedics for further evaluation and treatment.  You may alternate taking Tylenol/Motrin as needed for pain thank you for letting us treat you today. After performing a physical exam and reviewing your imaging, I feel you are safe to go home. Please follow up with your PCP in the next several days and provide them with your records from this visit. Return to the Emergency Room if pain becomes severe or symptoms worsen.

## 2024-01-15 NOTE — ED Provider Notes (Signed)
 [place] [facility] AT [facility] Provider Note   CSN: [id number] Arrival date & time: 01/15/24  1607     History  Chief Complaint  Patient presents with   Ruben Burton is a 83 y.o. male presents today after tripping while cutting bamboo.  Patient states he fell onto his left side.  Patient endorses left wrist pain and left forearm.  Patient denies head injury, LOC, numbness, weakness, head injury, neck pain, loss of consciousness loss of bowel or bladder, or saddle anesthesia.  Patient denies blood thinner use.   Fall       Home Medications Prior to Admission medications   Medication Sig Start Date End Date Taking? Authorizing Provider  aspirin EC 81 MG tablet Take 1 tablet (81 mg total) by mouth every evening. 09/17/19   Rehman, Joline Maxcy, MD  B Complex-C (B-COMPLEX WITH VITAMIN C) tablet Take 1 tablet by mouth daily after breakfast.    [provider]  calcium carbonate (OS-CAL) 1250 (500 Ca) MG chewable tablet Chew 1 tablet by mouth 2 (two) times daily.     [provider]  metoprolol (LOPRESSOR) 25 MG tablet Take one tablet by mouth twice daily. 05/09/11   Kathleene Hazel, MD  nitroGLYCERIN (NITROSTAT) 0.4 MG SL tablet Place 1 tablet (0.4 mg total) under the tongue every 5 (five) minutes as needed for chest pain. 01/10/14   Kathleene Hazel, MD  rosuvastatin (CRESTOR) 20 MG tablet TAKE 1 TABLET DAILY 07/26/18   Kathleene Hazel, MD  tamsulosin (FLOMAX) 0.4 MG CAPS capsule Take 0.4 mg by mouth daily.  07/07/16   [provider]      Allergies    Omeprazole, Pantoprazole, and Oxytetracycline    Review of Systems   Review of Systems  Musculoskeletal:  Positive for arthralgias.    Physical Exam Updated Vital Signs BP 139/88 (BP Location: Right Arm)   Pulse (!) 59   Temp 98 F (36.7 C) (Oral)   Resp 15   SpO2 97%  Physical Exam Constitutional:      Appearance: Normal appearance.  HENT:      Head: Normocephalic and atraumatic.     Right Ear: External ear normal.     Left Ear: External ear normal.     Nose: Nose normal.  Eyes:     Extraocular Movements: Extraocular movements intact.  Cardiovascular:     Rate and Rhythm: Normal rate.     Pulses: Normal pulses.  Pulmonary:     Effort: Pulmonary effort is normal.     Breath sounds: Normal breath sounds.  Abdominal:     Palpations: Abdomen is soft.  Musculoskeletal:        General: Swelling, tenderness and signs of injury present. Normal range of motion.     Cervical back: Normal range of motion.     Comments: Patient has tenderness to palpation and mild swelling of the lateral carpals of his left arm.  Patient also has tenderness to palpation, superficial abrasion, and mild swelling to the mid left forearm.  Patient has full range of motion and is neurovascularly intact of left hand, wrist and elbow.  Neurological:     General: No focal deficit present.     Mental Status: He is alert and oriented to person, place, and time.     Sensory: No sensory deficit.     Motor: No weakness.     ED Results / Procedures / Treatments   Labs (all  labs ordered are listed, but only abnormal results are displayed) Labs Reviewed - No data to display  EKG None  Radiology DG Forearm Left Result Date: 01/15/2024 CLINICAL DATA:  Fall EXAM: LEFT FOREARM - 2 VIEW COMPARISON:  None Available. FINDINGS: No fracture or malalignment at the forearm. Acute triquetral fracture at the dorsum of the wrist. Soft tissues are unremarkable IMPRESSION: 1. No acute osseous abnormality at the forearm. 2. Acute triquetral fracture. Electronically Signed   By: Jasmine Pang M.D.   On: 01/15/2024 20:17   DG Wrist Complete Left Result Date: 01/15/2024 CLINICAL DATA:  Fall on left wrist.  Pain and swelling. EXAM: LEFT WRIST - COMPLETE 3+ VIEW COMPARISON:  None Available. FINDINGS: There is small avulsion fracture of the triquetrum, as seen on the lateral  radiograph. No other acute fracture or dislocation. No aggressive osseous lesion. Mild diffuse degenerative changes of imaged joints. No radiopaque foreign bodies. Soft tissues are within normal limits. IMPRESSION: *Small avulsion fracture of the triquetrum. Electronically Signed   By: Jules Schick M.D.   On: 01/15/2024 17:45    Procedures Procedures    Medications Ordered in ED Medications - No data to display  ED Course/ Medical Decision Making/ A&P                                 Medical Decision Making Amount and/or Complexity of Data Reviewed Radiology: ordered.   This patient presents to the ED with chief complaint(s) of left wrist pain after fall with pertinent past medical history of none which further complicates the presenting complaint. The complaint involves an extensive differential diagnosis and also carries with it a high risk of complications and morbidity.    The differential diagnosis includes musculoskeletal pain, carpal fracture, radial fracture, ulnar fracture, metacarpal fracture  Additional history obtained: Records reviewed Primary Care Documents  ED Course and Reassessment: Volar splint applied I personally checked that the patient was neurovascularly intact after splint placement  Independent visualization of imaging: - I independently visualized the following imaging with scope of interpretation limited to determining acute life threatening conditions related to emergency care: Left wrist x-ray, which revealed small avulsion fracture of the triquetrum Left forearm x-ray: Negative  Consultation: - Consulted or discussed management/test interpretation w/ external professional: None  Consideration for admission or further workup: Considered for mission further workup however recent vital signs, physical exam, and imaging were reassuring.  Patient symptoms likely due to acute triquetrum fracture.  Patient has been placed in a volar splint and advised to  follow-up with orthopedics for further evaluation and treatment.        Final Clinical Impression(s) / ED Diagnoses Final diagnoses:  Closed nondisplaced fracture of triquetrum of left wrist, initial encounter    Rx / DC Orders ED Discharge Orders     None         Gretta Began 01/15/24 2113    Durwin Glaze, MD 01/15/24 [phone]

## 2024-01-15 NOTE — ED Triage Notes (Signed)
 FALL FROM STANDING  Tripped over something while cutting bamboo. Fell onto left side  Pain in left wrist  Denies hitting head, denies loc Takes daily asa, no blood thinner Happened around 2pm

## 2024-01-18 DIAGNOSIS — S62112A Displaced fracture of triquetrum [cuneiform] bone, left wrist, initial encounter for closed fracture: Secondary | ICD-10-CM | POA: Diagnosis not present

## 2024-01-20 DIAGNOSIS — R972 Elevated prostate specific antigen [PSA]: Secondary | ICD-10-CM | POA: Diagnosis not present

## 2024-01-20 DIAGNOSIS — N401 Enlarged prostate with lower urinary tract symptoms: Secondary | ICD-10-CM | POA: Diagnosis not present

## 2024-01-20 DIAGNOSIS — N403 Nodular prostate with lower urinary tract symptoms: Secondary | ICD-10-CM | POA: Diagnosis not present

## 2024-01-20 DIAGNOSIS — R3912 Poor urinary stream: Secondary | ICD-10-CM | POA: Diagnosis not present

## 2024-01-26 ENCOUNTER — Telehealth: Payer: Self-pay

## 2024-01-26 NOTE — Telephone Encounter (Signed)
   Pre-operative Risk Assessment    Patient Name: Ruben Burton  DOB: 1941-09-23 MRN: [id number]   Date of last office visit: 07/08/23 Verne Carrow, MD Date of next office visit: NONE   Request for Surgical Clearance    Procedure:   FUSION BX  Date of Surgery:  Clearance TBD ASAP                               Surgeon:  DR Laverle Patter Surgeon's Group or Practice Name:  [facility] Phone number:  [phone] Fax number:  [phone] ATTN: Lissa Hoard, CMA   Type of Clearance Requested:   - Medical  - Pharmacy:  Hold Aspirin 5 DAYS PRIOR   Type of Anesthesia:  Not Indicated   Additional requests/questions:    Signed, Marlow Baars   01/26/2024, 5:23 PM

## 2024-01-27 ENCOUNTER — Telehealth: Payer: Self-pay | Admitting: *Deleted

## 2024-01-27 NOTE — Telephone Encounter (Signed)
 Dr. Clifton James,  Ruben Burton 83 year old male is requesting preoperative cardiac evaluation for prostate fusion biopsy.  He was last seen in clinic on 07/08/2023.  He was doing well at that time.  Follow-up was planned for 12 months.  His PMH includes coronary artery disease and he is status post CABG 2011 with subsequent cardiac catheterization 12/17 and was noted to have stable CAD with patent bypass grafts.  His PMH also includes HTN, HLD, and paroxysmal atrial fibrillation.  May his aspirin be held prior to his procedure?  Thank you for your help.  Please direct your response to CV DIV preop pool.  Thomasene Ripple. Cherish Runde NP-C     01/27/2024, 8:08 AM [facility] [address] Office [phone] Fax [phone]

## 2024-01-27 NOTE — Telephone Encounter (Signed)
   Name: Ruben Burton  DOB: 08-Dec-1940  MRN: [id number]  Primary Cardiologist: Verne Carrow, MD   Preoperative team, please contact this patient and set up a phone call appointment for further preoperative risk assessment. Please obtain consent and complete medication review. Thank you for your help.  I confirm that guidance regarding antiplatelet and oral anticoagulation therapy has been completed and, if necessary, noted below.  His aspirin may be held for 5 days prior to his procedure.  Please resume as soon as hemostasis is achieved.  I also confirmed the patient resides in the state of [place]. As per [facility] Medical Board telemedicine laws, the patient must reside in the state in which the provider is licensed.   Ronney Asters, NP 01/27/2024, 8:20 AM [facility]

## 2024-01-27 NOTE — Telephone Encounter (Signed)
 Pt's spouse calling in after talking to pt two (2) medications were added on to list to take prior to surgery, valium and levofloxacin, medication list updated.

## 2024-01-27 NOTE — Telephone Encounter (Signed)
  Patient Consent for Virtual Visit         Ruben Burton has provided verbal consent on 01/27/2024 for a virtual visit (video or telephone).   CONSENT FOR VIRTUAL VISIT FOR:  Ruben Burton  By participating in this virtual visit I agree to the following:  I hereby voluntarily request, consent and authorize [facility] and its employed or contracted physicians, physician assistants, nurse practitioners or other licensed health care professionals (the Practitioner), to provide me with telemedicine health care services (the "Services") as deemed necessary by the treating Practitioner. I acknowledge and consent to receive the Services by the Practitioner via telemedicine. I understand that the telemedicine visit will involve communicating with the Practitioner through live audiovisual communication technology and the disclosure of certain medical information by electronic transmission. I acknowledge that I have been given the opportunity to request an in-person assessment or other available alternative prior to the telemedicine visit and am voluntarily participating in the telemedicine visit.  I understand that I have the right to withhold or withdraw my consent to the use of telemedicine in the course of my care at any time, without affecting my right to future care or treatment, and that the Practitioner or I may terminate the telemedicine visit at any time. I understand that I have the right to inspect all information obtained and/or recorded in the course of the telemedicine visit and may receive copies of available information for a reasonable fee.  I understand that some of the potential risks of receiving the Services via telemedicine include:  Delay or interruption in medical evaluation due to technological equipment failure or disruption; Information transmitted may not be sufficient (e.g. poor resolution of images) to allow for appropriate medical decision making by the  Practitioner; and/or  In rare instances, security protocols could fail, causing a breach of personal health information.  Furthermore, I acknowledge that it is my responsibility to provide information about my medical history, conditions and care that is complete and accurate to the best of my ability. I acknowledge that Practitioner's advice, recommendations, and/or decision may be based on factors not within their control, such as incomplete or inaccurate data provided by me or distortions of diagnostic images or specimens that may result from electronic transmissions. I understand that the practice of medicine is not an exact science and that Practitioner makes no warranties or guarantees regarding treatment outcomes. I acknowledge that a copy of this consent can be made available to me via my patient portal [facility] MyChart), or I can request a printed copy by calling the office of [facility].    I understand that my insurance will be billed for this visit.   I have read or had this consent read to me. I understand the contents of this consent, which adequately explains the benefits and risks of the Services being provided via telemedicine.  I have been provided ample opportunity to ask questions regarding this consent and the Services and have had my questions answered to my satisfaction. I give my informed consent for the services to be provided through the use of telemedicine in my medical care

## 2024-02-02 ENCOUNTER — Ambulatory Visit: Attending: Physician Assistant

## 2024-02-02 DIAGNOSIS — Z01810 Encounter for preprocedural cardiovascular examination: Secondary | ICD-10-CM | POA: Diagnosis not present

## 2024-02-02 NOTE — Progress Notes (Signed)
 Virtual Visit via Telephone Note   Because of Ruben Burton co-morbid illnesses, he is at least at moderate risk for complications without adequate follow up.  This format is felt to be most appropriate for this patient at this time.  Due to technical limitations with video connection (technology), today's appointment will be conducted as an audio only telehealth visit, and Ruben Burton verbally agreed to proceed in this manner.   All issues noted in this document were discussed and addressed.  No physical exam could be performed with this format.  Evaluation Performed:  Preoperative cardiovascular risk assessment _____________   Date:  02/02/2024   Patient ID:  Ruben Burton, DOB 09/21/1941, MRN [id number] Patient Location:  Home Provider location:   Office  Primary Care Provider:  Cleatis Polka., MD Primary Cardiologist:  Verne Carrow, MD  Chief Complaint / Patient Profile   83 y.o. y/o male with a h/o CAD status post CABG in 2011, HTN, HLD and postoperative atrial fibrillation who is pending fusion bx and presents today for telephonic preoperative cardiovascular risk assessment.  History of Present Illness    Ruben Burton is a 83 y.o. male who presents via audio/video conferencing for a telehealth visit today.  Pt was last seen in cardiology clinic on 07/08/23 by Dr. Clifton James.  At that time Ruben Burton was doing well.  The patient is now pending procedure as outlined above. Since his last visit, he feels good without any chest pain or shortness of breath.  He has 2 acres that he tends to.  Him and his wife usually walk a mile 3 days a week.  He well surpasses 4 METS on the DASI.    His aspirin may be held for 5 days prior to his procedure. Please resume as soon as hemostasis is achieved   Past Medical History    Past Medical History:  Diagnosis Date   Arthritis    "mild; fingers" (10/16/2016)   CAD, ARTERY BYPASS GRAFT 03/07/2010   Qualifier: Diagnosis of  By:  Denyse Amass CMA, Carol     Chest pain 10/16/2016   Coronary artery disease    a. CABG in 2011 b. Cath 10/16/16 - chronic total occlusion of mid LAD with patietn LIMA to mid LAD; severe ostial stenosis to large diagonal with patent SVG to diagonal; moderate non obstrctive RCA, normal LV function. Tx Rx   GERD 03/07/2010   Qualifier: Diagnosis of  By: Denyse Amass, CMA, Carol     GERD (gastroesophageal reflux disease)    Hx of cardiovascular stress test    a. ETT-MV 1/14: no ischemia; not gated   Hyperlipidemia    HYPERLIPIDEMIA 03/07/2010   Qualifier: Diagnosis of  By: Denyse Amass CMA, Carol     Hypertension    HYPERTENSION 03/07/2010   Qualifier: Diagnosis of  By: Denyse Amass CMA, Carol     PAROXYSMAL ATRIAL FIBRILLATION 03/07/2010   Qualifier: Diagnosis of  By: Denyse Amass CMA, Carol     Paroxysmal atrial fibrillation (HCC)    Pleural effusion    PLEURAL EFFUSION, LEFT 03/07/2010   Qualifier: Diagnosis of  By: Denyse Amass, CMA, Carol     Precordial pain    Unstable angina (HCC) 10/16/2016   Unstable angina pectoris (HCC) 10/16/2016   Past Surgical History:  Procedure Laterality Date   BIOPSY  09/16/2019   Procedure: BIOPSY;  Surgeon: Malissa Hippo, MD;  [facility];  Service: Endoscopy;;  gastric    CARDIAC CATHETERIZATION  ~ 2011;  10/16/2016   CARDIAC CATHETERIZATION N/A 10/16/2016   Procedure: Left Heart Cath and Cors/Grafts Angiography;  Surgeon: Kathleene Hazel, MD;  Location: [facility] INVASIVE CV LAB;  Service: Cardiovascular;  Laterality: N/A;   CHOLECYSTECTOMY OPEN  ~ 1992   COLONOSCOPY N/A 12/01/2013   Procedure: COLONOSCOPY;  Surgeon: Malissa Hippo, MD;  Location: [facility];  Service: Endoscopy;  Laterality: N/A;  830   COLONOSCOPY WITH PROPOFOL N/A 09/16/2019   Procedure: COLONOSCOPY WITH PROPOFOL;  Surgeon: Malissa Hippo, MD;  Location: [facility];  Service: Endoscopy;  Laterality: N/A;   CORONARY ARTERY BYPASS GRAFT  02-13-10   2V (LIMA to LAD, SVG to diagonal)    ESOPHAGOGASTRODUODENOSCOPY (EGD) WITH ESOPHAGEAL DILATION     ESOPHAGOGASTRODUODENOSCOPY (EGD) WITH PROPOFOL N/A 09/16/2019   Procedure: ESOPHAGOGASTRODUODENOSCOPY (EGD) WITH PROPOFOL;  Surgeon: Malissa Hippo, MD;  Location: [facility];  Service: Endoscopy;  Laterality: N/A;  955am   EXPLORATORY LAPAROTOMY  ~ 1995   for perforated duodenal ulcer   HYDROCELE EXCISION     POLYPECTOMY  09/16/2019   Procedure: POLYPECTOMY;  Surgeon: Malissa Hippo, MD;  Location: [facility];  Service: Endoscopy;;  colon    Allergies  Allergies  Allergen Reactions   Omeprazole Nausea Only   Pantoprazole Nausea Only   Oxytetracycline Other (See Comments)    Passes out.    Home Medications    Prior to Admission medications   Medication Sig Start Date End Date Taking? Authorizing Provider  aspirin EC 81 MG tablet Take 1 tablet (81 mg total) by mouth every evening. 09/17/19   Rehman, Joline Maxcy, MD  B Complex-C (B-COMPLEX WITH VITAMIN C) tablet Take 1 tablet by mouth daily after breakfast.    [provider]  calcium carbonate (OS-CAL) 1250 (500 Ca) MG chewable tablet Chew 1 tablet by mouth 2 (two) times daily.     [provider]  diazepam (VALIUM) 10 MG tablet Take 10 mg by mouth as directed. Take one tablet prior to surgery    [provider]  levofloxacin (LEVAQUIN) 750 MG tablet Take 750 mg by mouth as directed. Take one (1) tablet by mouth (750 mg) prior to surgery.    [provider]  metoprolol (LOPRESSOR) 25 MG tablet Take one tablet by mouth twice daily. 05/09/11   Kathleene Hazel, MD  nitroGLYCERIN (NITROSTAT) 0.4 MG SL tablet Place 1 tablet (0.4 mg total) under the tongue every 5 (five) minutes as needed for chest pain. 01/10/14   Kathleene Hazel, MD  rosuvastatin (CRESTOR) 20 MG tablet TAKE 1 TABLET DAILY 07/26/18   Kathleene Hazel, MD  tamsulosin (FLOMAX) 0.4 MG CAPS capsule Take 0.4 mg by mouth daily.  07/07/16   [provider]    Physical Exam    Vital Signs:  Ruben Burton does not have vital signs available for review today.  Given telephonic nature of communication, physical exam is limited. AAOx3. NAD. Normal affect.  Speech and respirations are unlabored.  Accessory Clinical Findings    None  Assessment & Plan    1.  Preoperative Cardiovascular Risk Assessment:  Ruben Burton's perioperative risk of a major cardiac event is 0.9% according to the Revised Cardiac Risk Index (RCRI).  Therefore, he is at low risk for perioperative complications.   His functional capacity is good at 5.62 METs according to the Duke Activity Status Index (DASI). Recommendations: According to ACC/AHA guidelines, no further cardiovascular testing needed.  The patient may proceed  to surgery at acceptable risk.   Antiplatelet and/or Anticoagulation Recommendations: Aspirin can be held for 5 days prior to his surgery.  Please resume Aspirin post operatively when it is felt to be safe from a bleeding standpoint.   The patient was advised that if he develops new symptoms prior to surgery to contact our office to arrange for a follow-up visit, and he verbalized understanding.   A copy of this note will be routed to requesting surgeon.  Time:   Today, I have spent 7 minutes with the patient with telehealth technology discussing medical history, symptoms, and management plan.     Sharlene Dory, PA-C  02/02/2024, 8:40 AM

## 2024-03-04 DIAGNOSIS — N4289 Other specified disorders of prostate: Secondary | ICD-10-CM | POA: Diagnosis not present

## 2024-03-04 DIAGNOSIS — C61 Malignant neoplasm of prostate: Secondary | ICD-10-CM | POA: Diagnosis not present

## 2024-03-04 DIAGNOSIS — R972 Elevated prostate specific antigen [PSA]: Secondary | ICD-10-CM | POA: Diagnosis not present

## 2024-03-14 DIAGNOSIS — H6121 Impacted cerumen, right ear: Secondary | ICD-10-CM | POA: Diagnosis not present

## 2024-03-14 DIAGNOSIS — C61 Malignant neoplasm of prostate: Secondary | ICD-10-CM | POA: Diagnosis not present

## 2024-03-14 DIAGNOSIS — W57XXXA Bitten or stung by nonvenomous insect and other nonvenomous arthropods, initial encounter: Secondary | ICD-10-CM | POA: Diagnosis not present

## 2024-03-14 DIAGNOSIS — I10 Essential (primary) hypertension: Secondary | ICD-10-CM | POA: Diagnosis not present

## 2024-03-14 DIAGNOSIS — R42 Dizziness and giddiness: Secondary | ICD-10-CM | POA: Diagnosis not present

## 2024-03-15 ENCOUNTER — Other Ambulatory Visit (HOSPITAL_COMMUNITY): Payer: Self-pay | Admitting: Urology

## 2024-03-15 DIAGNOSIS — C61 Malignant neoplasm of prostate: Secondary | ICD-10-CM

## 2024-03-17 ENCOUNTER — Ambulatory Visit (HOSPITAL_COMMUNITY)
Admission: RE | Admit: 2024-03-17 | Discharge: 2024-03-17 | Disposition: A | Discharge status: Home / Self Care | Source: Ambulatory Visit | Attending: Urology | Admitting: Urology

## 2024-03-17 DIAGNOSIS — C61 Malignant neoplasm of prostate: Secondary | ICD-10-CM | POA: Insufficient documentation

## 2024-03-17 MED ORDER — FLOTUFOLASTAT F 18 GALLIUM 296-5846 MBQ/ML IV SOLN
7.9200 | Freq: Once | INTRAVENOUS | Status: AC
  Administered 2024-03-17: 7.92 via INTRAVENOUS

## 2024-03-30 DIAGNOSIS — R42 Dizziness and giddiness: Secondary | ICD-10-CM | POA: Diagnosis not present

## 2024-04-04 DIAGNOSIS — R42 Dizziness and giddiness: Secondary | ICD-10-CM | POA: Diagnosis not present

## 2024-04-19 DIAGNOSIS — C61 Malignant neoplasm of prostate: Secondary | ICD-10-CM | POA: Diagnosis not present

## 2024-04-25 ENCOUNTER — Telehealth: Payer: Self-pay | Admitting: Radiation Oncology

## 2024-04-25 NOTE — Telephone Encounter (Signed)
 Orlean called in seeking a referral from Dr. Alline office. I transferred Orlean to the correct Department.

## 2024-04-28 ENCOUNTER — Ambulatory Visit (INDEPENDENT_AMBULATORY_CARE_PROVIDER_SITE_OTHER): Admitting: Otolaryngology

## 2024-04-28 ENCOUNTER — Encounter (INDEPENDENT_AMBULATORY_CARE_PROVIDER_SITE_OTHER): Payer: Self-pay | Admitting: Otolaryngology

## 2024-04-28 VITALS — BP 119/67 | HR 72 | Ht 72.0 in | Wt 185.0 lb

## 2024-04-28 DIAGNOSIS — R42 Dizziness and giddiness: Secondary | ICD-10-CM | POA: Diagnosis not present

## 2024-04-28 DIAGNOSIS — H903 Sensorineural hearing loss, bilateral: Secondary | ICD-10-CM

## 2024-04-28 DIAGNOSIS — H608X3 Other otitis externa, bilateral: Secondary | ICD-10-CM | POA: Insufficient documentation

## 2024-04-28 DIAGNOSIS — H6123 Impacted cerumen, bilateral: Secondary | ICD-10-CM | POA: Diagnosis not present

## 2024-04-28 NOTE — Progress Notes (Signed)
 Patient ID: Ruben Burton, male   DOB: [birth year], 83 y.o.   MRN: [id number]  Follow-up: Asymmetric hearing loss, eczematous otitis externa New complaint: Recurrent dizziness  HPI: The patient is an 83 year old male who returns today for his follow-up evaluation.  The patient was previously seen for his asymmetric sensorineural hearing loss and bilateral eczematous otitis externa.  He was noted to have bilateral high-frequency sensorineural hearing loss, slightly worse on the left side.  His MRI scan was negative for retrocochlear lesion.  His eczematous otitis externa was successfully treated with Elocon cream.  The patient returns today reporting no significant change in his hearing.  He has a new complaint of recurrent dizziness for the past 3 weeks.  He describes the dizziness as an off-balance and lightheaded sensation.  Each episode typically lasts for several minutes.  He denies any significant spinning vertigo.  He also denies any otalgia or otorrhea.  He was seen by a physical therapist earlier this week.  He was instructed to perform balance exercises at home.  Exam: General: Communicates without difficulty, well nourished, no acute distress. Head: Normocephalic, no evidence injury, no tenderness, facial buttresses intact without stepoff. Face/sinus: No tenderness to palpation and percussion. Facial movement is normal and symmetric. Eyes: PERRL, EOMI. No scleral icterus, conjunctivae clear. Neuro: CN II exam reveals vision grossly intact.  No nystagmus at any point of gaze. Ears: Auricles well formed without lesions.  Bilateral cerumen impaction.  Nose: External evaluation reveals normal support and skin without lesions.  Dorsum is intact.  Anterior rhinoscopy reveals congested mucosa over anterior aspect of inferior turbinates and intact septum.  No purulence noted. Oral:  Oral cavity and oropharynx are intact, symmetric, without erythema or edema.  Mucosa is moist without lesions. Neck: Full range  of motion without pain.  There is no significant lymphadenopathy.  No masses palpable.  Thyroid  bed within normal limits to palpation.  Parotid glands and submandibular glands equal bilaterally without mass.  Trachea is midline. Neuro:  CN 2-12 grossly intact. Vestibular: No nystagmus at any point of gaze. Dix Hallpike negative.  Vestibular: There is no nystagmus with pneumatic pressure on either tympanic membrane or Valsalva. The cerebellar examination is unremarkable.    Procedure: Bilateral cerumen disimpaction Anesthesia: None Description: Under the operating microscope, the cerumen is carefully removed with a combination of cerumen currette, alligator forceps, and suction catheters.  After the cerumen is removed, the TMs are noted to be normal.  No mass, erythema, or lesions. The patient tolerated the procedure well.    Assessment: 1.  Bilateral cerumen impaction.  After the disimpaction procedure, both ear canals and tympanic membranes are noted to be normal.  No middle ear effusion is noted. 2.  Recurrent dizziness of unknown etiology. The possible differential diagnoses include transient BPPV, vestibular migraine, Meniere's disease, peripheral vestibular dysfunction, or other central/systemic causes.   3.  His Dix-Hallpike maneuver is negative. 4.  Subjectively stable bilateral high-frequency sensorineural hearing loss, worse on the left side. 5.  His chronic eczematous otitis externa has resolved.  Plan: 1.  Otomicroscopy with bilateral cerumen disimpaction. 2.  The physical exam findings are reviewed with the patient. 3.  The pathophysiology of vestibular dysfunction and dizziness are discussed extensively with the patient. The possible differential diagnoses are reviewed. Questions are invited and answered.   4.  Continue with balance and vestibular exercises at home. 5.  The patient will return for reevaluation in 6 months, sooner if needed.

## 2024-05-09 NOTE — Progress Notes (Signed)
 GU Location of Tumor / Histology: Prostate Ca  If Prostate Cancer, Gleason Score is (4 + 5) and PSA is (7.0 on 12/09/2023)  Ruben Burton presented as referral from Dr. Gretel Ferrara [facility]) elevated PSA.  Biopsies      03/17/2024 Dr. Gretel Ferrara NM PET (PSMA) Skull to Mid Thigh CLINICAL DATA:  High-risk prostate carcinoma. Staging.   IMPRESSION: Focus of radiotracer accumulation in the left lateral prostate, consistent with primary prostate carcinoma. No evidence of metastatic disease. Moderate to large hiatal hernia. Small fat-containing left inguinal hernia.   12/29/2023 Dr. Lonni Devere MR Prostate with/without Contrast CLINICAL DATA:  Elevated PSA level. R97.20   IMPRESSION: 1. PI-RADS category 4 lesion of the left peripheral zone. Targeting data sent to [facility]. 2. Fatty bilateral spermatic cords, left greater than right.    Past/Anticipated interventions by urology, if any: NA  Past/Anticipated interventions by medical oncology, if any:   NA  Weight changes, if any: {:18581}  IPSS: SHIM:  Bowel/Bladder complaints, if any: {:18581}  Nausea/Vomiting, if any: {:18581} Pain issues, if any:  {:18581}  SAFETY ISSUES: Prior radiation? {:18581} Pacemaker/ICD? {:18581} Possible current pregnancy? Male Is the patient on methotrexate? No  Current Complaints / other details:

## 2024-05-09 NOTE — Progress Notes (Signed)
 Radiation Oncology         [phone] ________________________________  Initial Outpatient Consultation  Name: Ruben Burton MRN: [id number]  Date: 05/10/2024  DOB: 09-29-1941  [name], Ruben Burton., MD  Ruben Burton Glance, MD   REFERRING PHYSICIAN: Renda Glance, MD  DIAGNOSIS: 83 y.o. 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  No diagnosis found.  HISTORY OF PRESENT ILLNESS: Ruben Burton is a 83 y.o. male with a diagnosis of prostate cancer. He has been followed by urology for many years for a history of BPH with LUTS. More recently, he was noted to have an elevated PSA of 7 by his primary care physician, Dr. Loreli.  Accordingly, he was referred for evaluation in urology by Dr. Devere on 12/18/23. He underwent a prostate MRI on 12/29/23 showing: PI-RADS 4 lesion of left peripheral zone. He was seen by Dr. Renda on 01/20/24 for a second opinion, digital rectal examination performed at that time showed significant induration of left lateral aspect of prostate extending toward apex. The patient proceeded to MRI fusion biopsy of the prostate on 03/04/24.  The prostate volume measured 28.5 cc.  Out of 16 core biopsies, 9 were positive.  The maximum Gleason score was 4+5, and this was seen in all four samples from the MRI ROI, left mid lateral, left apex lateral, left mid, and left apex. Additionally, Gleason 3+3 was seen in right apex.  He underwent staging PSMA PET scan on 03/17/24 showing no evidence of disease outside of the prostate.  The patient reviewed the biopsy results with his urologist and he has kindly been referred today for discussion of potential radiation treatment options.   PREVIOUS RADIATION THERAPY: No  PAST MEDICAL HISTORY:  Past Medical History:  Diagnosis Date   Arthritis    mild; fingers (10/16/2016)   CAD, ARTERY BYPASS GRAFT 03/07/2010   Qualifier: Diagnosis of  By: Wynetta CMA, Ruben Burton     Chest pain 10/16/2016    Coronary artery disease    a. CABG in 2011 b. Cath 10/16/16 - chronic total occlusion of mid LAD with patietn LIMA to mid LAD; severe ostial stenosis to large diagonal with patent SVG to diagonal; moderate non obstrctive RCA, normal LV function. Tx Rx   GERD 03/07/2010   Qualifier: Diagnosis of  By: Wynetta, CMA, Ruben Burton     GERD (gastroesophageal reflux disease)    Hx of cardiovascular stress test    a. ETT-MV 1/14: no ischemia; not gated   Hyperlipidemia    HYPERLIPIDEMIA 03/07/2010   Qualifier: Diagnosis of  By: Wynetta CMA, Ruben Burton     Hypertension    HYPERTENSION 03/07/2010   Qualifier: Diagnosis of  By: Wynetta CMA, Ruben Burton     PAROXYSMAL ATRIAL FIBRILLATION 03/07/2010   Qualifier: Diagnosis of  By: Wynetta CMA, Ruben Burton     Paroxysmal atrial fibrillation (HCC)    Pleural effusion    PLEURAL EFFUSION, LEFT 03/07/2010   Qualifier: Diagnosis of  By: Wynetta, CMA, Ruben Burton     Precordial pain    Unstable angina (HCC) 10/16/2016   Unstable angina pectoris (HCC) 10/16/2016      PAST SURGICAL HISTORY: Past Surgical History:  Procedure Laterality Date   BIOPSY  09/16/2019   Procedure: BIOPSY;  Surgeon: Golda Ruben PENNER, MD;  [facility];  Service: Endoscopy;;  gastric    CARDIAC CATHETERIZATION  ~ 2011; 10/16/2016   CARDIAC CATHETERIZATION N/A 10/16/2016   Procedure: Left Heart Cath  and Cors/Grafts Angiography;  Surgeon: Ruben JONETTA Cash, MD;  Location: [facility] INVASIVE CV LAB;  Service: Cardiovascular;  Laterality: N/A;   CHOLECYSTECTOMY OPEN  ~ 1992   COLONOSCOPY N/A 12/01/2013   Procedure: COLONOSCOPY;  Surgeon: Ruben Ruben Rivet, MD;  Location: [facility];  Service: Endoscopy;  Laterality: N/A;  830   COLONOSCOPY WITH PROPOFOL  N/A 09/16/2019   Procedure: COLONOSCOPY WITH PROPOFOL ;  Surgeon: Burton Ruben RAYMOND, MD;  Location: [facility];  Service: Endoscopy;  Laterality: N/A;   CORONARY ARTERY BYPASS GRAFT  02-13-10   2V (LIMA to LAD, SVG to diagonal)   ESOPHAGOGASTRODUODENOSCOPY (EGD) WITH  ESOPHAGEAL DILATION     ESOPHAGOGASTRODUODENOSCOPY (EGD) WITH PROPOFOL  N/A 09/16/2019   Procedure: ESOPHAGOGASTRODUODENOSCOPY (EGD) WITH PROPOFOL ;  Surgeon: Burton Ruben RAYMOND, MD;  [facility];  Service: Endoscopy;  Laterality: N/A;  955am   EXPLORATORY LAPAROTOMY  ~ 1995   for perforated duodenal ulcer   HYDROCELE EXCISION     POLYPECTOMY  09/16/2019   Procedure: POLYPECTOMY;  Surgeon: Burton Ruben RAYMOND, MD;  Location: [facility];  Service: Endoscopy;;  colon    FAMILY HISTORY:  Family History  Problem Relation Age of Onset   Other Mother        alive- healthy   Hypertension Mother    Stroke Father        deceased   Hypertension Father    Other Brother        CABG   Heart attack Brother    Hypertension Brother    Colon cancer Neg Hx     SOCIAL HISTORY:  Social History   Socioeconomic History   Marital status: Married    Spouse name: Not on file   Number of children: 2   Years of education: Not on file   Highest education level: Not on file  Occupational History   Occupation: retirede    Associate Professor: RETIRED  Tobacco Use   Smoking status: Former    Current packs/day: 0.00    Average packs/day: 0.3 packs/day for 1 year (0.3 ttl pk-yrs)    Types: Cigarettes    Start date: 12    Quit date: 1960    Years since quitting: 65.5   Smokeless tobacco: Never  Vaping Use   Vaping status: Never Used  Substance and Sexual Activity   Alcohol  use: No   Drug use: No   Sexual activity: Yes  Other Topics Concern   Not on file  Social History Narrative   Not on file   Social Drivers of Health   Financial Resource Strain: Not on file  Food Insecurity: Patient Declined (03/10/2024)   Hunger Vital Sign    Worried About Running Out of Food in the Last Year: Patient declined    Ran Out of Food in the Last Year: Patient declined  Transportation Needs: Patient Declined (03/10/2024)   PRAPARE - Administrator, Civil Service (Medical): Patient declined     Lack of Transportation (Non-Medical): Patient declined  Physical Activity: Not on file  Stress: Not on file  Social Connections: Not on file  Intimate Partner Violence: Patient Declined (03/10/2024)   Humiliation, Afraid, Rape, and Kick questionnaire    Fear of Current or Ex-Partner: Patient declined    Emotionally Abused: Patient declined    Physically Abused: Patient declined    Sexually Abused: Patient declined    ALLERGIES: Omeprazole , Pantoprazole , and Oxytetracycline  MEDICATIONS:  Current Outpatient Medications  Medication Sig Dispense Refill   aspirin   EC 81 MG tablet Take 1 tablet (81 mg total) by mouth every evening.     B Complex-C (B-COMPLEX WITH VITAMIN C) tablet Take 1 tablet by mouth daily after breakfast.     calcium  carbonate (OS-CAL) 1250 (500 Ca) MG chewable tablet Chew 1 tablet by mouth 2 (two) times daily.      diazepam (VALIUM) 10 MG tablet Take 10 mg by mouth as directed. Take one tablet prior to surgery     levofloxacin (LEVAQUIN) 750 MG tablet Take 750 mg by mouth as directed. Take one (1) tablet by mouth (750 mg) prior to surgery.     metoprolol  (LOPRESSOR ) 25 MG tablet Take one tablet by mouth twice daily. 180 tablet 3   nitroGLYCERIN  (NITROSTAT ) 0.4 MG SL tablet Place 1 tablet (0.4 mg total) under the tongue every 5 (five) minutes as needed for chest pain. 25 tablet 11   rosuvastatin  (CRESTOR ) 20 MG tablet TAKE 1 TABLET DAILY 90 tablet 2   tamsulosin  (FLOMAX ) 0.4 MG CAPS capsule Take 0.4 mg by mouth daily.      No current facility-administered medications for this encounter.    REVIEW OF SYSTEMS:  On review of systems, the patient reports that he is doing well overall. He denies any chest pain, shortness of breath, cough, fevers, chills, night sweats, unintended weight changes. He denies any bowel disturbances, and denies abdominal pain, nausea or vomiting. He denies any new musculoskeletal or joint aches or pains. His IPSS was ***, indicating *** urinary  symptoms. His SHIM was ***, indicating he {does not have/has mild/moderate/severe} erectile dysfunction. A complete review of systems is obtained and is otherwise negative.    PHYSICAL EXAM:  Wt Readings from Last 3 Encounters:  04/28/24 185 lb (83.9 kg)  07/08/23 200 lb 6.4 oz (90.9 kg)  06/04/23 185 lb (83.9 kg)   Temp Readings from Last 3 Encounters:  01/15/24 98 F (36.7 C) (Oral)  06/04/23 98.2 F (36.8 C) (Oral)  01/06/23 98.2 F (36.8 C) (Oral)   BP Readings from Last 3 Encounters:  04/28/24 119/67  02/27/24 126/81  01/15/24 139/88   Pulse Readings from Last 3 Encounters:  04/28/24 72  02/27/24 63  01/15/24 (!) 59    /10  In general this is a well appearing *** male in no acute distress. He's alert and oriented x4 and appropriate throughout the examination. Cardiopulmonary assessment is negative for acute distress, and he exhibits normal effort.     KPS = ***  100 - Normal; no complaints; no evidence of disease. 90   - Able to carry on normal activity; minor signs or symptoms of disease. 80   - Normal activity with effort; some signs or symptoms of disease. 24   - Cares for self; unable to carry on normal activity or to do active work. 60   - Requires occasional assistance, but is able to care for most of his personal needs. 50   - Requires considerable assistance and frequent medical care. 40   - Disabled; requires special care and assistance. 30   - Severely disabled; hospital admission is indicated although death not imminent. 20   - Very sick; hospital admission necessary; active supportive treatment necessary. 10   - Moribund; fatal processes progressing rapidly. 0     - Dead  Karnofsky DA, Abelmann WH, Craver LS and Burchenal [facility] [phone]) The use of the nitrogen mustards in the palliative treatment of carcinoma: with particular reference to bronchogenic carcinoma Cancer 1 634-56  LABORATORY  DATA:  Lab Results  Component Value Date   WBC 6.0 06/04/2023    HGB 16.1 06/04/2023   HCT 49.1 06/04/2023   MCV 98.0 06/04/2023   PLT 118 (L) 06/04/2023   Lab Results  Component Value Date   NA 139 06/04/2023   K 4.1 06/04/2023   CL 105 06/04/2023   CO2 19 (L) 06/04/2023   Lab Results  Component Value Date   ALT 21 07/29/2019   AST 20 07/29/2019   ALKPHOS 47 10/16/2016   BILITOT 2.0 (H) 07/29/2019     RADIOGRAPHY: No results found.    IMPRESSION/PLAN: 1. 83 y.o. 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 We discussed the patient's workup and outlined the nature of prostate cancer in this setting. The patient's T stage, Gleason's score, and PSA put him into the high risk group. Accordingly, he is eligible for a variety of potential treatment options including LT-ADT concurrent with 8 weeks of external radiation, 5 weeks of external radiation with an upfront brachytherapy boost, or prostatectomy. We discussed the available radiation techniques, and focused on the details and logistics of delivery. We discussed and outlined the risks, benefits, short and long-term effects associated with radiotherapy and compared and contrasted these with prostatectomy. We discussed the role of SpaceOAR gel in reducing the rectal toxicity associated with radiotherapy. We also detailed the role of ADT in the treatment of high risk prostate cancer and outlined the associated side effects that could be expected with this therapy. He appears to have a good understanding of his disease and our treatment recommendations which are of curative intent.  He was encouraged to ask questions that were answered to his stated satisfaction.  At the conclusion of our conversation, the patient is interested in moving forward with ***.  We personally spent *** minutes in this encounter including chart review, reviewing radiological studies, meeting face-to-face with the patient, entering orders and completing  documentation.   ------------------------------------------------   Donnice Barge, MD [facility]  Radiation Oncology Direct Dial: [phone]  Fax: [phone] [email or web address]  Skype  [email or web address]   This document serves as a record of services personally performed by Donnice Barge, MD. It was created on his behalf by Izetta Neither, a trained medical scribe. The creation of this record is based on the scribe's personal observations and the provider's statements to them. This document has been checked and approved by the attending provider.

## 2024-05-10 ENCOUNTER — Ambulatory Visit
Admission: RE | Admit: 2024-05-10 | Discharge: 2024-05-10 | Disposition: A | Discharge status: Home / Self Care | Source: Ambulatory Visit | Attending: Radiation Oncology | Admitting: Radiation Oncology

## 2024-05-10 ENCOUNTER — Encounter: Payer: Self-pay | Admitting: Radiation Oncology

## 2024-05-10 VITALS — BP 137/80 | HR 63 | Temp 97.7°F | Resp 18 | Ht 73.0 in | Wt 204.8 lb

## 2024-05-10 DIAGNOSIS — M129 Arthropathy, unspecified: Secondary | ICD-10-CM | POA: Diagnosis not present

## 2024-05-10 DIAGNOSIS — Z87891 Personal history of nicotine dependence: Secondary | ICD-10-CM | POA: Diagnosis not present

## 2024-05-10 DIAGNOSIS — I4891 Unspecified atrial fibrillation: Secondary | ICD-10-CM | POA: Insufficient documentation

## 2024-05-10 DIAGNOSIS — I2510 Atherosclerotic heart disease of native coronary artery without angina pectoris: Secondary | ICD-10-CM | POA: Diagnosis not present

## 2024-05-10 DIAGNOSIS — C61 Malignant neoplasm of prostate: Secondary | ICD-10-CM | POA: Insufficient documentation

## 2024-05-10 DIAGNOSIS — Z7982 Long term (current) use of aspirin: Secondary | ICD-10-CM | POA: Diagnosis not present

## 2024-05-10 DIAGNOSIS — E785 Hyperlipidemia, unspecified: Secondary | ICD-10-CM | POA: Insufficient documentation

## 2024-05-10 DIAGNOSIS — K219 Gastro-esophageal reflux disease without esophagitis: Secondary | ICD-10-CM | POA: Diagnosis not present

## 2024-05-10 DIAGNOSIS — Z79899 Other long term (current) drug therapy: Secondary | ICD-10-CM | POA: Diagnosis not present

## 2024-05-10 DIAGNOSIS — I10 Essential (primary) hypertension: Secondary | ICD-10-CM | POA: Diagnosis not present

## 2024-05-10 DIAGNOSIS — Z191 Hormone sensitive malignancy status: Secondary | ICD-10-CM | POA: Diagnosis not present

## 2024-05-10 HISTORY — DX: Elevated prostate specific antigen (PSA): R97.20

## 2024-05-11 ENCOUNTER — Telehealth: Payer: Self-pay | Admitting: *Deleted

## 2024-05-11 NOTE — Telephone Encounter (Signed)
 CALLED PATIENT TO INFORM OF APPT. FOR ADT ON 05/20/24 - ARRIVAL TIME- 8:30 AM @ DR. BORDEN'S OFFICE, SPOKE WITH PATIENT'S WIFE- Ruben Burton AND SHE IS AWARE OF THIS APPT.

## 2024-05-16 ENCOUNTER — Ambulatory Visit: Admitting: Radiation Oncology

## 2024-05-16 ENCOUNTER — Ambulatory Visit

## 2024-05-16 NOTE — Progress Notes (Signed)
 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   Patient is scheduled to start ADT at [facility] on 7/18.   RN left message for call back to assess any barriers or additional questions.

## 2024-05-20 DIAGNOSIS — C61 Malignant neoplasm of prostate: Secondary | ICD-10-CM | POA: Diagnosis not present

## 2024-05-20 NOTE — Progress Notes (Signed)
 Patient received Eligard 45mg  on 05/20/2024.   Pending brachy boost date.

## 2024-05-25 ENCOUNTER — Other Ambulatory Visit: Payer: Self-pay | Admitting: Urology

## 2024-05-25 ENCOUNTER — Telehealth: Payer: Self-pay | Admitting: Cardiovascular Disease

## 2024-05-25 ENCOUNTER — Telehealth: Payer: Self-pay | Admitting: *Deleted

## 2024-05-25 NOTE — Telephone Encounter (Signed)
   Pre-operative Risk Assessment    Patient Name: Ruben Burton  DOB: 1941-08-27 MRN: [id number]   Date of last office visit: 07/28/23 Date of next office visit: 07/06/24   Request for Surgical Clearance    Procedure:  Brachytherapy and space oar  Date of Surgery:  Clearance 08/25/24                                Surgeon:  Dr. Gretel Ferrara Surgeon's Group or Practice Name:  [facility] Phone number:  [phone]  Fax number:  [phone]   Type of Clearance Requested:   - Medical  - Pharmacy:  Hold Aspirin      Type of Anesthesia:  General    Additional requests/questions:     SignedHerma KATHEE Blush   05/25/2024, 10:09 AM

## 2024-05-25 NOTE — Telephone Encounter (Signed)
 Patient has an upcoming appointment clearance can be addressed at ov and a note has been made on appointment line that clearance is needed

## 2024-05-25 NOTE — Telephone Encounter (Signed)
   Name: BUTLER VEGH  DOB: 1941/10/20  MRN: [id number]  Primary Cardiologist: Lonni Cash, MD  Chart reviewed as part of pre-operative protocol coverage. The patient has an upcoming visit scheduled with Dr. Cash on 07/06/2024 at which time clearance can be addressed in case there are any issues that would impact surgical recommendations.  Brachytherapy and space oar is not scheduled until 08/25/2024 as below. I added preop FYI to appointment note so that provider is aware to address at time of outpatient visit.  Per office protocol the cardiology provider should forward their finalized clearance decision and recommendations regarding antiplatelet therapy to the requesting party below.    Dr. Cash will provide input on holding ASA as requested below so that this information is available to the clearing provider at time of patient's appointment.   I will route this message as FYI to requesting party and remove this message from the preop box as separate preop APP input not needed at this time.   Please call with any questions.  Lamarr Satterfield, NP  05/25/2024, 11:08 AM

## 2024-05-25 NOTE — Telephone Encounter (Signed)
 CALLED PATIENT TO INFORM  PRE-SEED APPTS.  AND IMPLANT DATE, SPOKE WITH PATIENTS WIFE- Ruben Burton AND SHE IS AWARE OF THESE APPTS.

## 2024-05-26 DIAGNOSIS — S80861A Insect bite (nonvenomous), right lower leg, initial encounter: Secondary | ICD-10-CM | POA: Diagnosis not present

## 2024-05-26 DIAGNOSIS — L2989 Other pruritus: Secondary | ICD-10-CM | POA: Diagnosis not present

## 2024-05-26 DIAGNOSIS — L814 Other melanin hyperpigmentation: Secondary | ICD-10-CM | POA: Diagnosis not present

## 2024-05-26 DIAGNOSIS — L720 Epidermal cyst: Secondary | ICD-10-CM | POA: Diagnosis not present

## 2024-05-26 DIAGNOSIS — L218 Other seborrheic dermatitis: Secondary | ICD-10-CM | POA: Diagnosis not present

## 2024-05-26 DIAGNOSIS — L538 Other specified erythematous conditions: Secondary | ICD-10-CM | POA: Diagnosis not present

## 2024-05-26 DIAGNOSIS — L820 Inflamed seborrheic keratosis: Secondary | ICD-10-CM | POA: Diagnosis not present

## 2024-05-26 DIAGNOSIS — D225 Melanocytic nevi of trunk: Secondary | ICD-10-CM | POA: Diagnosis not present

## 2024-05-26 DIAGNOSIS — L821 Other seborrheic keratosis: Secondary | ICD-10-CM | POA: Diagnosis not present

## 2024-05-30 NOTE — Progress Notes (Signed)
 RN spoke with patient and his wife and introduce myself and my role.  RN provided education on next steps with brachy boost and 5 weeks of daily radiation.   All questions answered.  Plan of care in progress.

## 2024-06-02 ENCOUNTER — Telehealth: Payer: Self-pay | Admitting: Cardiovascular Disease

## 2024-06-02 NOTE — Telephone Encounter (Signed)
 Left message for patient to call back

## 2024-06-02 NOTE — Telephone Encounter (Signed)
 STAT if patient feels like he/she is going to faint   Are you dizzy, lightheaded, or faint now? No, but felt it about an hour ago   Have you passed out? No IF YES MOVE TO .SYNCOPECVD  Do you have any other symptoms? No, just extreme dizziness.   Have you checked your HR and BP (record if available)? No.

## 2024-06-03 NOTE — Telephone Encounter (Signed)
 Patient states he has been experiencing dizziness for the past several months. He states his PCP suggested it could be vertigo, and he has tried vestibular therapy with no improvement noted.   Patient has also taken prescribed meclizine with no improvement noted.  Patient states dizziness is worse when standing from a lying position, but once he gets going he feels OK. He reports he is drinking about 32 oz of water  daily. Encouraged patient to increase his water  intake by about 20 oz, and trying to drink a glass of water  first thing in the morning before his coffee to see if this helps improve his dizziness.  He reports BP/HR yesterday were 127/70 and 60 bpm. He denies any SOB or chest pain.   Will forward to Dr. Verlin to review and advise on other recommendations.

## 2024-06-03 NOTE — Telephone Encounter (Signed)
 Pt returning call to nurse. Pt sais they had asked yesterday to be called on mobile phone but was called back on home phone. Pt asked that we call mobile number [phone]

## 2024-06-10 ENCOUNTER — Ambulatory Visit (INDEPENDENT_AMBULATORY_CARE_PROVIDER_SITE_OTHER): Payer: Medicare Other | Admitting: Otolaryngology

## 2024-07-06 ENCOUNTER — Ambulatory Visit: Attending: Cardiovascular Disease | Admitting: Cardiovascular Disease

## 2024-07-06 ENCOUNTER — Encounter: Payer: Self-pay | Admitting: Cardiovascular Disease

## 2024-07-06 VITALS — BP 114/68 | HR 49 | Ht 73.0 in | Wt 208.6 lb

## 2024-07-06 DIAGNOSIS — E7800 Pure hypercholesterolemia, unspecified: Secondary | ICD-10-CM | POA: Diagnosis not present

## 2024-07-06 DIAGNOSIS — I25810 Atherosclerosis of coronary artery bypass graft(s) without angina pectoris: Secondary | ICD-10-CM | POA: Diagnosis not present

## 2024-07-06 DIAGNOSIS — I10 Essential (primary) hypertension: Secondary | ICD-10-CM | POA: Diagnosis not present

## 2024-07-06 DIAGNOSIS — Z01810 Encounter for preprocedural cardiovascular examination: Secondary | ICD-10-CM | POA: Insufficient documentation

## 2024-07-06 NOTE — Patient Instructions (Signed)
 Medication Instructions:  Your physician recommends that you continue on your current medications as directed. Please refer to the Current Medication list given to you today.  *If you need a refill on your cardiac medications before your next appointment, please call your pharmacy*  Lab Work: None ordered   Testing/Procedures: Your physician has requested that you have an echocardiogram. Echocardiography is a painless test that uses sound waves to create images of your heart. It provides your doctor with information about the size and shape of your heart and how well your heart's chambers and valves are working. This procedure takes approximately one hour. There are no restrictions for this procedure. Please do NOT wear cologne, perfume, aftershave, or lotions (deodorant is allowed). Please arrive 15 minutes prior to your appointment time.  Please note: We ask at that you not bring children with you during ultrasound (echo/ vascular) testing. Due to room size and safety concerns, children are not allowed in the ultrasound rooms during exams. Our front office staff cannot provide observation of children in our lobby area while testing is being conducted. An adult accompanying a patient to their appointment will only be allowed in the ultrasound room at the discretion of the ultrasound technician under special circumstances. We apologize for any inconvenience.   Follow-Up: At [facility], you and your health needs are our priority.  As part of our continuing mission to provide you with exceptional heart care, our providers are all part of one team.  This team includes your primary Cardiologist (physician) and Advanced Practice Providers or APPs (Physician Assistants and Nurse Practitioners) who all work together to provide you with the care you need, when you need it.  Your next appointment:   1 year(s)  Provider:   Lonni Cash, MD     Thank you for choosing [facility]!!   [phone]

## 2024-07-06 NOTE — Progress Notes (Signed)
 Chief Complaint  Patient presents with   Follow-up    CAD   History of Present Illness: 83 yo male with history of CAD s/p CABG in 2011, HTN, HLD and post-op atrial fibrillation here today for follow up.  He was admitted in April 2011 with a NSTEMI and was found to have severe disease in the LAD and Diagonal. He underwent 2V CABG with LIMA to LAD and SVG to Diagonal. Postop course was complicated by atrial fibrillation. Echo 2011 with LVEF 55-60%, mildly dilated RV. No evidence of atrial fib on post-op cardiac monitor in 2011.  Coumadin was discontinued. Stress myoview February 2016 with no ischemia. PVCs noted on EKG during stress. Admitted to [facility] December 2017 with chest pain. Cardiac cath in December 2017 with stable CAD (occluded mid LAD and Diagonal with patent LIMA to LAD and patent SVG to Diagonal). The RCA had mild to moderate plaque. The Circumflex has no obstructive disease. PVCs noted on monitor during hospitalization. Echo December 2018 with normal LV systolic function, grade 2 diastolic dysfunction, no valve disease. He was seen in our office in March 2022 with c/o dizziness felt to be related to vertigo. Carotid artery dopplers with no significant carotid artery disease. He has been diagnosed with prostate cancer. He has been started on hormone therapy and is having radioactive seed implants soon.   He is here today for follow up. The patient denies any chest pain, dyspnea, palpitations, lower extremity edema, orthopnea, PND, dizziness, near syncope or syncope.   Primary Care Physician: Loreli Elsie JONETTA Mickey., MD  Past Medical History:  Diagnosis Date   Arthritis    mild; fingers (10/16/2016)   CAD, ARTERY BYPASS GRAFT 03/07/2010   Qualifier: Diagnosis of  By: Wynetta CMA, Carol     Chest pain 10/16/2016   Coronary artery disease    a. CABG in 2011 b. Cath 10/16/16 - chronic total occlusion of mid LAD with patietn LIMA to mid LAD; severe ostial stenosis to large diagonal with  patent SVG to diagonal; moderate non obstrctive RCA, normal LV function. Tx Rx   Elevated PSA    GERD 03/07/2010   Qualifier: Diagnosis of  By: Wynetta, CMA, Carol     GERD (gastroesophageal reflux disease)    Hx of cardiovascular stress test    a. ETT-MV 1/14: no ischemia; not gated   Hyperlipidemia    HYPERLIPIDEMIA 03/07/2010   Qualifier: Diagnosis of  By: Wynetta CMA, Carol     Hypertension    HYPERTENSION 03/07/2010   Qualifier: Diagnosis of  By: Wynetta CMA, Carol     PAROXYSMAL ATRIAL FIBRILLATION 03/07/2010   Qualifier: Diagnosis of  By: Wynetta CMA, Carol     Paroxysmal atrial fibrillation (HCC)    Pleural effusion    PLEURAL EFFUSION, LEFT 03/07/2010   Qualifier: Diagnosis of  By: Wynetta, CMA, Carol     Precordial pain    Unstable angina (HCC) 10/16/2016   Unstable angina pectoris (HCC) 10/16/2016    Past Surgical History:  Procedure Laterality Date   BIOPSY  09/16/2019   Procedure: BIOPSY;  Surgeon: Golda Claudis PENNER, MD;  Location: [facility];  Service: Endoscopy;;  gastric    CARDIAC CATHETERIZATION  ~ 2011; 10/16/2016   CARDIAC CATHETERIZATION N/A 10/16/2016   Procedure: Left Heart Cath and Cors/Grafts Angiography;  Surgeon: Lonni JONETTA Cash, MD;  Location: [facility] INVASIVE CV LAB;  Service: Cardiovascular;  Laterality: N/A;   CHOLECYSTECTOMY OPEN  ~ 1992   COLONOSCOPY N/A 12/01/2013  Procedure: COLONOSCOPY;  Surgeon: Claudis RAYMOND Rivet, MD;  Location: [facility];  Service: Endoscopy;  Laterality: N/A;  830   COLONOSCOPY WITH PROPOFOL  N/A 09/16/2019   Procedure: COLONOSCOPY WITH PROPOFOL ;  Surgeon: Rivet Claudis RAYMOND, MD;  Location: [facility];  Service: Endoscopy;  Laterality: N/A;   CORONARY ARTERY BYPASS GRAFT  02/13/2010   2V (LIMA to LAD, SVG to diagonal)   ESOPHAGOGASTRODUODENOSCOPY (EGD) WITH ESOPHAGEAL DILATION     ESOPHAGOGASTRODUODENOSCOPY (EGD) WITH PROPOFOL  N/A 09/16/2019   Procedure: ESOPHAGOGASTRODUODENOSCOPY (EGD) WITH PROPOFOL ;  Surgeon: Rivet Claudis RAYMOND, MD;  Location: [facility];  Service: Endoscopy;  Laterality: N/A;  955am   EXPLORATORY LAPAROTOMY  ~ 1995   for perforated duodenal ulcer   HYDROCELE EXCISION     POLYPECTOMY  09/16/2019   Procedure: POLYPECTOMY;  Surgeon: Rivet Claudis RAYMOND, MD;  Location: [facility];  Service: Endoscopy;;  colon   PROSTATE BIOPSY      Current Outpatient Medications  Medication Sig Dispense Refill   aspirin  EC 81 MG tablet Take 1 tablet (81 mg total) by mouth every evening.     B Complex-C (B-COMPLEX WITH VITAMIN C) tablet Take 1 tablet by mouth daily after breakfast.     calcium  carbonate (OS-CAL) 1250 (500 Ca) MG chewable tablet Chew 1 tablet by mouth 2 (two) times daily.      Cholecalciferol (VITAMIN D-1000 MAX ST) 25 MCG (1000 UT) tablet 2 capsules daily Oral     Cyanocobalamin  (B-12) 1000 MCG TABS take 1 tab po qd Oral     famotidine (PEPCID) 20 MG tablet 1 tablet at bedtime as needed Orally Once a day     meclizine (ANTIVERT) 25 MG tablet 1 tablet as needed Orally every 8 hrs for 20 days     metoprolol  (LOPRESSOR ) 25 MG tablet Take one tablet by mouth twice daily. 180 tablet 3   nitroGLYCERIN  (NITROSTAT ) 0.4 MG SL tablet Place 1 tablet (0.4 mg total) under the tongue every 5 (five) minutes as needed for chest pain. 25 tablet 11   rosuvastatin  (CRESTOR ) 20 MG tablet TAKE 1 TABLET DAILY 90 tablet 2   tamsulosin  (FLOMAX ) 0.4 MG CAPS capsule Take 0.4 mg by mouth daily.      No current facility-administered medications for this visit.    Allergies  Allergen Reactions   Omeprazole  Nausea Only   Pantoprazole  Nausea Only   Pollen Extract     Other Reaction(s): Unknown   Oxytetracycline Other (See Comments)    Passes out.  Other Reaction(s): Unknown    Social History   Socioeconomic History   Marital status: Married    Spouse name: Not on file   Number of children: 2   Years of education: Not on file   Highest education level: Not on file  Occupational History   Occupation:  retirede    Employer: RETIRED  Tobacco Use   Smoking status: Former    Current packs/day: 0.00    Average packs/day: 0.3 packs/day for 1 year (0.3 ttl pk-yrs)    Types: Cigarettes    Start date: 7    Quit date: 1960    Years since quitting: 65.7   Smokeless tobacco: Never  Vaping Use   Vaping status: Never Used  Substance and Sexual Activity   Alcohol  use: No   Drug use: No   Sexual activity: Yes  Other Topics Concern   Not on file  Social History Narrative   Not on file   Social Drivers of  Health   Financial Resource Strain: Not on file  Food Insecurity: No Food Insecurity (05/10/2024)   Hunger Vital Sign    Worried About Running Out of Food in the Last Year: Never true    Ran Out of Food in the Last Year: Never true  Transportation Needs: No Transportation Needs (05/10/2024)   PRAPARE - Administrator, Civil Service (Medical): No    Lack of Transportation (Non-Medical): No  Physical Activity: Not on file  Stress: Not on file  Social Connections: Not on file  Intimate Partner Violence: Not At Risk (05/10/2024)   Humiliation, Afraid, Rape, and Kick questionnaire    Fear of Current or Ex-Partner: No    Emotionally Abused: No    Physically Abused: No    Sexually Abused: No    Family History  Problem Relation Age of Onset   Other Mother        alive- healthy   Hypertension Mother    Stroke Father        deceased   Hypertension Father    Other Brother        CABG   Heart attack Brother    Hypertension Brother    Colon cancer Neg Hx     Review of Systems:  As stated in the HPI and otherwise negative.   BP 114/68   Pulse (!) 49   Ht 6' 1 (1.854 m)   Wt 208 lb 9.6 oz (94.6 kg)   SpO2 93%   BMI 27.52 kg/m   Physical Examination: General: Well developed, well nourished, NAD  HEENT: OP clear, mucus membranes moist  SKIN: warm, dry. No rashes. Neuro: No focal deficits  Musculoskeletal: Muscle strength 5/5 all ext  Psychiatric: Mood and affect  normal  Neck: No JVD, no carotid bruits, no thyromegaly, no lymphadenopathy.  Lungs:Clear bilaterally, no wheezes, rhonci, crackles Cardiovascular: Regular rate and rhythm. No murmurs, gallops or rubs. Abdomen:Soft. Bowel sounds present. Non-tender.  Extremities: No lower extremity edema. Pulses are 2 + in the bilateral DP/PT.  Cardiac cath December 2017: Diagnostic Diagram        Echo 10/19/17: Left ventricle: The cavity size was normal. Wall thickness was   normal. Systolic function was normal. The estimated ejection   fraction was in the range of 60% to 65%. Wall motion was normal;   there were no regional wall motion abnormalities. Features are   consistent with a pseudonormal left ventricular filling pattern,   with concomitant abnormal relaxation and increased filling   pressure (grade 2 diastolic dysfunction).  EKG:  EKG is ordered today. The ekg ordered today demonstrates EKG Interpretation Date/Time:  Wednesday July 06 2024 08:07:37 EDT Ventricular Rate:  49 PR Interval:  202 QRS Duration:  88 QT Interval:  462 QTC Calculation: 417 R Axis:   -35  Text Interpretation: Sinus bradycardia Left axis deviation Nonspecific T wave abnormality Confirmed by Verlin Bruckner [phone]) on 07/06/2024 8:23:37 AM    Recent Labs: No results found for requested labs within last 365 days.   Lipids: followed in primary care:   Wt Readings from Last 3 Encounters:  07/06/24 208 lb 9.6 oz (94.6 kg)  05/10/24 204 lb 12.8 oz (92.9 kg)  04/28/24 185 lb (83.9 kg)    Assessment and Plan:   1. Coronary Artery Disease without angina: He is s/p CABG in 2011. Cardiac cath in December 2017 with stable CAD with patent bypass grafts. Echo December 2018 with normal LV systolic function. No chest pain  suggestive of angina. Will continue ASA, statin and beta blocker.   Will arrange echo to check LV function.   2. Hypertension: BP is well controlled. No changes  3. Hyperlipidemia: Lipids  managed in primary care. Continue statin.   4. Paroxysmal atrial fibrillation: sinus rhythm. He has had no known recurrences of atrial fibrillation since his post-op period in 2011. Continue Lopressor .   5. Pre-operative cardiovascular examination: He is doing well from a cardiac standpoint. He can proceed with his planned prostate seed implants. OK to hold ASA for procedure.   Labs/ tests ordered today include:   Orders Placed This Encounter  Procedures   EKG 12-Lead   ECHOCARDIOGRAM COMPLETE   Disposition:   F/U with me in 12 months  Signed, Lonni Cash, MD 07/06/2024 9:14 AM    [facility] [address] [place], [place], [place]  [address] Phone: [phone]; Fax: [phone]

## 2024-07-07 DIAGNOSIS — H43393 Other vitreous opacities, bilateral: Secondary | ICD-10-CM | POA: Diagnosis not present

## 2024-07-25 NOTE — Progress Notes (Incomplete)
 Pre-seed nursing interview for a diagnosis:  83 y.o. 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   Patient identity verified x2.   Patient states issues as follows...  -Pain: *** -Fatigue: *** -Abdomen: *** -Groin: *** -Urinary: *** -Bowels: *** -Appetite: ***  Patient denies all other related issues at this time.  Meaningful use complete.  Urinary Management medication(s)-  Tamsulosin  0.4 mg po daily. Urology appointment date-  Brachytherapy procedure with Dr. Gretel Ferrara on 08/25/2024 at [facility].  No vitals needed for this visit.  This concludes the interaction.  NURSE REMINDER: START 'PRE-SEED' EDUCATION VIDEO AT 4:25 mins.

## 2024-07-26 ENCOUNTER — Telehealth: Payer: Self-pay | Admitting: *Deleted

## 2024-07-26 NOTE — Telephone Encounter (Signed)
 Called patient to remind of pre-seed appts. for 07-28-24, spoke with patient and he is aware of these appts.

## 2024-07-27 NOTE — Progress Notes (Signed)
  Radiation Oncology         [phone] ________________________________  Name: Ruben Burton MRN: [id number]  Date: 07/28/2024  DOB: 1941-03-11  SIMULATION AND TREATMENT PLANNING NOTE PUBIC ARCH STUDY Seed Implant Boost to be Followed by IMRT  [name], [name], MD  Renda Glance, MD  DIAGNOSIS:  83 y.o. 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   Oncology History   No history exists.      ICD-10-CM   1. Malignant neoplasm of prostate (HCC)  C61       COMPLEX SIMULATION:  The patient presented today for evaluation for possible prostate seed implant. He was brought to the radiation planning suite and placed supine on the CT couch. A 3-dimensional image study set was obtained in upload to the planning computer. There, on each axial slice, I contoured the prostate gland. Then, using three-dimensional radiation planning tools I reconstructed the prostate in view of the structures from the transperineal needle pathway to assess for possible pubic arch interference. In doing so, I did not appreciate any pubic arch interference. Also, the patient's prostate volume was estimated based on the drawn structure. The volume was 19 cc.  Given the pubic arch appearance and prostate volume, patient remains a good candidate to proceed with prostate seed implant. Today, he freely provided informed written consent to proceed.    PLAN: The patient will undergo prostate seed implant boost to 110 Gy to be followed by IMRT.   ________________________________  Donnice LABOR. Patrcia, M.D.

## 2024-07-28 ENCOUNTER — Ambulatory Visit
Admission: RE | Admit: 2024-07-28 | Discharge: 2024-07-28 | Disposition: A | Discharge status: Home / Self Care | Payer: Self-pay | Source: Ambulatory Visit | Attending: Radiation Oncology | Admitting: Radiation Oncology

## 2024-07-28 ENCOUNTER — Ambulatory Visit
Admission: RE | Admit: 2024-07-28 | Discharge: 2024-07-28 | Discharge status: Home / Self Care | Source: Ambulatory Visit | Attending: Radiation Oncology | Admitting: Radiation Oncology

## 2024-07-28 ENCOUNTER — Encounter: Payer: Self-pay | Admitting: Urology

## 2024-07-28 DIAGNOSIS — C61 Malignant neoplasm of prostate: Secondary | ICD-10-CM

## 2024-07-28 DIAGNOSIS — Z191 Hormone sensitive malignancy status: Secondary | ICD-10-CM | POA: Diagnosis not present

## 2024-07-28 NOTE — Progress Notes (Addendum)
 Radiation Oncology         [phone] ________________________________  Outpatient Follow up- Pre-seed visit  Name: Ruben Burton MRN: [id number]  Date: 07/28/2024  DOB: 1941-05-24  [name], [name], MD  Renda Glance, MD   REFERRING PHYSICIAN: Renda Glance, MD  DIAGNOSIS: 83 y.o. 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    ICD-10-CM   1. Malignant neoplasm of prostate (HCC)  C61       HISTORY OF PRESENT ILLNESS: Ruben Burton is a 83 y.o. male with a diagnosis of prostate cancer. He has been followed by urology for many years for a history of BPH with LUTS. More recently, he was noted to have an elevated PSA of 7 by his primary care physician, Dr. Loreli.  Accordingly, he was referred for evaluation in urology by Dr. Devere on 12/18/23. He underwent a prostate MRI on 12/29/23 showing: PI-RADS 4 lesion of left peripheral zone.       He was seen by Dr. Renda on 01/20/24 for a second opinion, digital rectal examination performed at that time showed significant induration of left lateral aspect of prostate extending toward apex. The patient proceeded to MRI fusion biopsy of the prostate on 03/04/24.  The prostate volume measured 28.5 cc.  Out of 16 core biopsies, 9 were positive.  The maximum Gleason score was 4+5, and this was seen in all four samples from the MRI ROI, left mid lateral, left apex lateral, left mid, and left apex. Additionally, Gleason 3+3 was seen in right apex.      He underwent staging PSMA PET scan on 03/17/24 showing no evidence of disease outside of the prostate.     The patient reviewed the biopsy results with his urologist and was kindly referred to us  for discussion of potential radiation treatment options. We initially met the patient on 05/10/2024 and he was most interested in proceeding with  LT-ADT now, and then seed implant boost followed by 5 weeks of daily pelvic IMRT for treatment of his  disease. He started ADT with a 6 month Eligard  injection on 05/20/24 and is here today for his pre-procedure imaging for planning and to answer any additional questions he may have about this treatment.   PREVIOUS RADIATION THERAPY: No  PAST MEDICAL HISTORY:  Past Medical History:  Diagnosis Date   Arthritis    mild; fingers (10/16/2016)   CAD, ARTERY BYPASS GRAFT 03/07/2010   Qualifier: Diagnosis of  By: Wynetta CMA, Carol     Chest pain 10/16/2016   Coronary artery disease    a. CABG in 2011 b. Cath 10/16/16 - chronic total occlusion of mid LAD with patietn LIMA to mid LAD; severe ostial stenosis to large diagonal with patent SVG to diagonal; moderate non obstrctive RCA, normal LV function. Tx Rx   Elevated PSA    GERD 03/07/2010   Qualifier: Diagnosis of  By: Fiato, CMA, Carol     GERD (gastroesophageal reflux disease)    Hx of cardiovascular stress test    a. ETT-MV 1/14: no ischemia; not gated   Hyperlipidemia    HYPERLIPIDEMIA 03/07/2010   Qualifier: Diagnosis of  By: Wynetta CMA, Carol     Hypertension    HYPERTENSION 03/07/2010   Qualifier: Diagnosis of  By: Wynetta CMA, Carol     PAROXYSMAL ATRIAL FIBRILLATION 03/07/2010   Qualifier: Diagnosis of  By: Wynetta CMA, Carol     Paroxysmal atrial fibrillation (HCC)  Pleural effusion    PLEURAL EFFUSION, LEFT 03/07/2010   Qualifier: Diagnosis of  By: Wynetta, CMA, Carol     Precordial pain    Unstable angina (HCC) 10/16/2016   Unstable angina pectoris (HCC) 10/16/2016      PAST SURGICAL HISTORY: Past Surgical History:  Procedure Laterality Date   BIOPSY  09/16/2019   Procedure: BIOPSY;  Surgeon: Golda Claudis PENNER, MD;  [facility];  Service: Endoscopy;;  gastric    CARDIAC CATHETERIZATION  ~ 2011; 10/16/2016   CARDIAC CATHETERIZATION N/A 10/16/2016   Procedure: Left Heart Cath and Cors/Grafts Angiography;  Surgeon: Lonni JONETTA Cash, MD;  Location: [facility] INVASIVE CV LAB;  Service: Cardiovascular;   Laterality: N/A;   CHOLECYSTECTOMY OPEN  ~ 1992   COLONOSCOPY N/A 12/01/2013   Procedure: COLONOSCOPY;  Surgeon: Claudis PENNER Golda, MD;  Location: [facility];  Service: Endoscopy;  Laterality: N/A;  830   COLONOSCOPY WITH PROPOFOL  N/A 09/16/2019   Procedure: COLONOSCOPY WITH PROPOFOL ;  Surgeon: Golda Claudis PENNER, MD;  Location: [facility];  Service: Endoscopy;  Laterality: N/A;   CORONARY ARTERY BYPASS GRAFT  02/13/2010   2V (LIMA to LAD, SVG to diagonal)   ESOPHAGOGASTRODUODENOSCOPY (EGD) WITH ESOPHAGEAL DILATION     ESOPHAGOGASTRODUODENOSCOPY (EGD) WITH PROPOFOL  N/A 09/16/2019   Procedure: ESOPHAGOGASTRODUODENOSCOPY (EGD) WITH PROPOFOL ;  Surgeon: Golda Claudis PENNER, MD;  Location: [facility];  Service: Endoscopy;  Laterality: N/A;  955am   EXPLORATORY LAPAROTOMY  ~ 1995   for perforated duodenal ulcer   HYDROCELE EXCISION     POLYPECTOMY  09/16/2019   Procedure: POLYPECTOMY;  Surgeon: Golda Claudis PENNER, MD;  Location: [facility];  Service: Endoscopy;;  colon   PROSTATE BIOPSY      FAMILY HISTORY:  Family History  Problem Relation Age of Onset   Other Mother        alive- healthy   Hypertension Mother    Stroke Father        deceased   Hypertension Father    Other Brother        CABG   Heart attack Brother    Hypertension Brother    Colon cancer Neg Hx     SOCIAL HISTORY:  Social History   Socioeconomic History   Marital status: Married    Spouse name: Not on file   Number of children: 2   Years of education: Not on file   Highest education level: Not on file  Occupational History   Occupation: retirede    Associate Professor: RETIRED  Tobacco Use   Smoking status: Former    Current packs/day: 0.00    Average packs/day: 0.3 packs/day for 1 year (0.3 ttl pk-yrs)    Types: Cigarettes    Start date: 66    Quit date: 1960    Years since quitting: 65.7   Smokeless tobacco: Never  Vaping Use   Vaping status: Never Used  Substance and Sexual Activity   Alcohol  use:  No   Drug use: No   Sexual activity: Yes  Other Topics Concern   Not on file  Social History Narrative   Not on file   Social Drivers of Health   Financial Resource Strain: Not on file  Food Insecurity: No Food Insecurity (07/28/2024)   Hunger Vital Sign    Worried About Running Out of Food in the Last Year: Never true    Ran Out of Food in the Last Year: Never true  Transportation Needs: No Transportation Needs (07/28/2024)  PRAPARE - Administrator, Civil Service (Medical): No    Lack of Transportation (Non-Medical): No  Physical Activity: Not on file  Stress: Not on file  Social Connections: Not on file  Intimate Partner Violence: Not At Risk (07/28/2024)   Humiliation, Afraid, Rape, and Kick questionnaire    Fear of Current or Ex-Partner: No    Emotionally Abused: No    Physically Abused: No    Sexually Abused: No    ALLERGIES: Omeprazole , Pantoprazole , Pollen extract, and Oxytetracycline  MEDICATIONS:  Current Outpatient Medications  Medication Sig Dispense Refill   aspirin  EC 81 MG tablet Take 1 tablet (81 mg total) by mouth every evening.     B Complex-C (B-COMPLEX WITH VITAMIN C) tablet Take 1 tablet by mouth daily after breakfast.     calcium  carbonate (OS-CAL) 1250 (500 Ca) MG chewable tablet Chew 1 tablet by mouth 2 (two) times daily.      Cholecalciferol (VITAMIN D-1000 MAX ST) 25 MCG (1000 UT) tablet 2 capsules daily Oral     Cyanocobalamin  (B-12) 1000 MCG TABS take 1 tab po qd Oral     famotidine (PEPCID) 20 MG tablet 1 tablet at bedtime as needed Orally Once a day     metoprolol  (LOPRESSOR ) 25 MG tablet Take one tablet by mouth twice daily. 180 tablet 3   nitroGLYCERIN  (NITROSTAT ) 0.4 MG SL tablet Place 1 tablet (0.4 mg total) under the tongue every 5 (five) minutes as needed for chest pain. 25 tablet 11   rosuvastatin  (CRESTOR ) 20 MG tablet TAKE 1 TABLET DAILY 90 tablet 2   tamsulosin  (FLOMAX ) 0.4 MG CAPS capsule Take 0.4 mg by mouth daily.       No current facility-administered medications for this encounter.    REVIEW OF SYSTEMS:  On review of systems, the patient reports that he is doing well overall. He denies any chest pain, shortness of breath, cough, fevers, chills, night sweats, unintended weight changes. He denies any bowel disturbances, and denies abdominal pain, nausea or vomiting. He denies any new musculoskeletal or joint aches or pains. His IPSS was Total Score: 12, indicating moderate urinary symptoms (Reference 0-7 mild, 8-19 moderate, 20-35 severe).  His SHIM: 18, indicating he has mild erectile dysfunction (Reference - 22-25 None, 17-21 Mild, 8-16 Moderate, 1-7 Severe). A complete review of systems is obtained and is otherwise negative.     PHYSICAL EXAM:  Wt Readings from Last 3 Encounters:  07/06/24 94.6 kg  05/10/24 92.9 kg  04/28/24 83.9 kg   Temp Readings from Last 3 Encounters:  05/10/24 97.7 F (36.5 C)  01/15/24 98 F (36.7 C) (Oral)  06/04/23 98.2 F (36.8 C) (Oral)   BP Readings from Last 3 Encounters:  07/06/24 114/68  05/10/24 137/80  04/28/24 119/67   Pulse Readings from Last 3 Encounters:  07/06/24 (!) 49  05/10/24 63  04/28/24 72    /10  In general this is a well appearing Caucasian male in no acute distress. He's alert and oriented x4 and appropriate throughout the examination. Cardiopulmonary assessment is negative for acute distress, and he exhibits normal effort.     KPS = 100  100 - Normal; no complaints; no evidence of disease. 90   - Able to carry on normal activity; minor signs or symptoms of disease. 80   - Normal activity with effort; some signs or symptoms of disease. 54   - Cares for self; unable to carry on normal activity or to do active work. 60   -  Requires occasional assistance, but is able to care for most of his personal needs. 50   - Requires considerable assistance and frequent medical care. 40   - Disabled; requires special care and assistance. 30   -  Severely disabled; hospital admission is indicated although death not imminent. 20   - Very sick; hospital admission necessary; active supportive treatment necessary. 10   - Moribund; fatal processes progressing rapidly. 0     - Dead  Karnofsky DA, Abelmann WH, Craver LS and Burchenal [facility] [phone]) The use of the nitrogen mustards in the palliative treatment of carcinoma: with particular reference to bronchogenic carcinoma Cancer 1 634-56  LABORATORY DATA:  Lab Results  Component Value Date   WBC 6.0 06/04/2023   HGB 16.1 06/04/2023   HCT 49.1 06/04/2023   MCV 98.0 06/04/2023   PLT 118 (L) 06/04/2023   Lab Results  Component Value Date   NA 139 06/04/2023   K 4.1 06/04/2023   CL 105 06/04/2023   CO2 19 (L) 06/04/2023   Lab Results  Component Value Date   ALT 21 07/29/2019   AST 20 07/29/2019   ALKPHOS 47 10/16/2016   BILITOT 2.0 (H) 07/29/2019     RADIOGRAPHY: No results found.    IMPRESSION/PLAN: 1. 83 y.o. 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 The patient has elected to proceed with seed implant followed by 5 weeks of external beam radiation to the pelvis, concurrent with LT-ADT, for treatment of his disease. We reviewed the risks, benefits, short and long-term effects associated with brachytherapy and discussed the role of SpaceOAR in reducing the rectal toxicity associated with radiotherapy.  He appears to have a good understanding of his disease and our treatment recommendations which are of curative intent.  He was encouraged to ask questions that were answered to his stated satisfaction. He has freely signed written consent to proceed today in the office and a copy of this document will be placed in his medical record. He has already started Eligard  ADT on 05/20/24 and his procedure is tentatively scheduled for 08/25/2024 in collaboration with Dr. Renda. We will see him back for his post-procedure visit approximately 2-3  after his seed boost procedure and will proceed with CT SIM prostate for treatment planning in anticipation of beginning the 5 week course of daily external beam radiation 3-4 weeks after the seed boost procedure. Weenjoyed meeting him and look forward to continuing to participate in his care. He knows that he is welcome to call with any questions or concerns at any time in the interim.  I personally spent 30 minutes in this encounter including chart review, reviewing radiological studies, meeting face-to-face with the patient, entering orders and completing documentation.   Sabra MICAEL Rusk, MMS, PA-C [facility] Radiation Oncology Physician Assistant Direct Dial: [phone]  Fax: [phone]

## 2024-07-28 NOTE — Progress Notes (Addendum)
 Pre-seed nursing interview for a diagnosis:  83 y.o. 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   Patient identity verified x2.   Patient states issues as follows...  -Pain: 0/10 -Fatigue: Mild still active. -Abdomen: No -Groin: No -Urinary:  urinary frequency, nocturia x 2 -Bowels:  Good -Appetite: Too good. -Weight : 205 lb Reports having hot flashes.  Patient denies all other related issues at this time.  Meaningful use complete.  Urinary Management medication(s)-  Tamsulosin  0.4 mg po daily. Urology appointment date-  Brachytherapy procedure with Dr. Gretel Ferrara on 08/25/2024 at [facility].  No vitals needed for this visit.  This concludes the interaction.  NURSE REMINDER: START 'PRE-SEED' EDUCATION VIDEO AT 4:25 mins.

## 2024-08-04 ENCOUNTER — Ambulatory Visit: Payer: Self-pay | Admitting: Cardiovascular Disease

## 2024-08-04 ENCOUNTER — Ambulatory Visit (HOSPITAL_COMMUNITY)
Admission: RE | Admit: 2024-08-04 | Discharge: 2024-08-04 | Disposition: A | Discharge status: Home / Self Care | Source: Ambulatory Visit | Attending: Cardiology | Admitting: Cardiology

## 2024-08-04 DIAGNOSIS — I25810 Atherosclerosis of coronary artery bypass graft(s) without angina pectoris: Secondary | ICD-10-CM | POA: Insufficient documentation

## 2024-08-04 LAB — ECHOCARDIOGRAM COMPLETE
Area-P 1/2: 2.8 cm2
S' Lateral: 3.1 cm

## 2024-08-05 ENCOUNTER — Telehealth: Payer: Self-pay | Admitting: Cardiovascular Disease

## 2024-08-05 NOTE — Telephone Encounter (Signed)
 Spoke with patient's wife Orlean (OK per [facility]).   Shared Dr. Santa response on echo results: His heart is strong. Mild leakiness of the mitral valve. cdm   Orlean verbalized understanding and expressed appreciation for call.

## 2024-08-05 NOTE — Telephone Encounter (Signed)
 Pts wife requesting a c/b to go over results.

## 2024-08-09 ENCOUNTER — Telehealth: Payer: Self-pay | Admitting: *Deleted

## 2024-08-09 NOTE — Telephone Encounter (Signed)
 RETURNED PATIENT'S WIFE'S PHONE CALL, SPOKE WITH WIFE- Ruben Burton

## 2024-08-12 ENCOUNTER — Ambulatory Visit (HOSPITAL_COMMUNITY)

## 2024-08-12 NOTE — Progress Notes (Signed)
 RN spoke with patient prior to upcoming brachy boost.   All questions answered.   No additional needs at this time.

## 2024-08-16 DIAGNOSIS — C61 Malignant neoplasm of prostate: Secondary | ICD-10-CM | POA: Diagnosis not present

## 2024-08-17 NOTE — Patient Instructions (Signed)
 SURGICAL WAITING ROOM VISITATION  Patients having surgery or a procedure may have no more than 2 support people in the waiting area - these visitors may rotate.    Children under the age of 22 must have an adult with them who is not the patient.  Visitors with respiratory illnesses are discouraged from visiting and should remain at home.  If the patient needs to stay at the hospital during part of their recovery, the visitor guidelines for inpatient rooms apply. Pre-op nurse will coordinate an appropriate time for 1 support person to accompany patient in pre-op.  This support person may not rotate.    Please refer to the [facility] website for the visitor guidelines for Inpatients (after your surgery is over and you are in a regular room).     Your procedure is scheduled on: 08/25/24   Report to [facility] Main Entrance    Report to admitting at 5:15 AM   Call this number if you have problems the morning of surgery [phone]   Do not eat food or drink liquids :After Midnight          If you have questions, please contact your surgeon's office.   FOLLOW BOWEL PREP AND ANY ADDITIONAL PRE OP INSTRUCTIONS YOU RECEIVED FROM YOUR SURGEON'S OFFICE!!! Fleet enema     Oral Hygiene is also important to reduce your risk of infection.                                    Remember - BRUSH YOUR TEETH THE MORNING OF SURGERY WITH YOUR REGULAR TOOTHPASTE  DENTURES WILL BE REMOVED PRIOR TO SURGERY PLEASE DO NOT APPLY Poly grip OR ADHESIVES!!!   Stop all vitamins and herbal supplements 7 days before surgery.   Take these medicines the morning of surgery with A SIP OF WATER : Metoprolol , Rosuvastatin , Tamsulosin                               You may not have any metal on your body including jewelry, and body piercing             Do not wear lotions, powders, cologne, or deodorant              Men may shave face and neck.   Do not bring valuables to the hospital. [facility] IS  NOT             RESPONSIBLE   FOR VALUABLES.   Contacts, glasses, dentures or bridgework may not be worn into surgery.  DO NOT BRING YOUR HOME MEDICATIONS TO THE HOSPITAL. PHARMACY WILL DISPENSE MEDICATIONS LISTED ON YOUR MEDICATION LIST TO YOU DURING YOUR ADMISSION IN THE HOSPITAL!    Patients discharged on the day of surgery will not be allowed to drive home.  Someone NEEDS to stay with you for the first 24 hours after anesthesia.   Special Instructions: Bring a copy of your healthcare power of attorney and living will documents the day of surgery if you haven't scanned them before.              Please read over the following fact sheets you were given: IF YOU HAVE QUESTIONS ABOUT YOUR PRE-OP INSTRUCTIONS PLEASE CALL [name].   If you received a COVID test during your pre-op visit  it is requested that you wear a mask when  out in public, stay away from anyone that may not be feeling well and notify your surgeon if you develop symptoms. If you test positive for Covid or have been in contact with anyone that has tested positive in the last 10 days please notify you surgeon.    [facility] - Preparing for Surgery Before surgery, you can play an important role.  Because skin is not sterile, your skin needs to be as free of germs as possible.  You can reduce the number of germs on your skin by washing with CHG (chlorahexidine gluconate) soap before surgery.  CHG is an antiseptic cleaner which kills germs and bonds with the skin to continue killing germs even after washing. Please DO NOT use if you have an allergy to CHG or antibacterial soaps.  If your skin becomes reddened/irritated stop using the CHG and inform your nurse when you arrive at Short Stay. Do not shave (including legs and underarms) for at least 48 hours prior to the first CHG shower.  You may shave your face/neck.  Please follow these instructions carefully:  1.  Shower with CHG Soap the night before surgery and the  morning of surgery.  2.  If you choose to wash your hair, wash your hair first as usual with your normal  shampoo.  3.  After you shampoo, rinse your hair and body thoroughly to remove the shampoo.                             4.  Use CHG as you would any other liquid soap.  You can apply chg directly to the skin and wash.  Gently with a scrungie or clean washcloth.  5.  Apply the CHG Soap to your body ONLY FROM THE NECK DOWN.   Do   not use on face/ open                           Wound or open sores. Avoid contact with eyes, ears mouth and   genitals (private parts).                       Wash face,  Genitals (private parts) with your normal soap.             6.  Wash thoroughly, paying special attention to the area where your    surgery  will be performed.  7.  Thoroughly rinse your body with warm water  from the neck down.  8.  DO NOT shower/wash with your normal soap after using and rinsing off the CHG Soap.                9.  Pat yourself dry with a clean towel.            10.  Wear clean pajamas.            11.  Place clean sheets on your bed the night of your first shower and do not  sleep with pets. Day of Surgery : Do not apply any CHG, lotions/deodorants the morning of surgery.  Please wear clean clothes to the hospital/surgery center.  FAILURE TO FOLLOW THESE INSTRUCTIONS MAY RESULT IN THE CANCELLATION OF YOUR SURGERY  PATIENT SIGNATURE_________________________________  NURSE SIGNATURE__________________________________  ________________________________________________________________________

## 2024-08-17 NOTE — Progress Notes (Signed)
 COVID Vaccine Completed:  Date of COVID positive in last 90 days:  PCP - Elsie Gentry, MD Cardiologist - Lonni Cash, MD  Cardiac clearance by Dr. Cash 07/06/24 in Epic   PET- 03/17/24 Epic Chest x-ray - N/A EKG - 07/06/24 Epic  Stress Test - 12/29/14 Epic ECHO - 08/04/24 Epic  Cardiac Cath - 10/16/16 Epic  Pacemaker/ICD device last checked:N/A Spinal Cord Stimulator:N/A  Bowel Prep - N/A  Sleep Study - N/A CPAP -   Fasting Blood Sugar - N/A Checks Blood Sugar _____ times a day  Last dose of GLP1 agonist-  N/A GLP1 instructions:  Do not take after     Last dose of SGLT-2 inhibitors-  N/A SGLT-2 instructions:  Do not take after     Blood Thinner Instructions: N/A Last dose:   Time: Aspirin  Instructions: ASA 81, ok to hold per cards, hold 5 days Last Dose:  Activity level: Can go up a flight of stairs and perform activities of daily living without stopping and without symptoms of chest pain or shortness of breath.   Anesthesia review: HTN, CAD, a fib, has wire in chest post open heart surgery- per pt nothing electrical   Patient denies shortness of breath, fever, cough and chest pain at PAT appointment  Patient verbalized understanding of instructions that were given to them at the PAT appointment. Patient was also instructed that they will need to review over the PAT instructions again at home before surgery.

## 2024-08-18 ENCOUNTER — Other Ambulatory Visit: Payer: Self-pay

## 2024-08-18 ENCOUNTER — Encounter (HOSPITAL_COMMUNITY)
Admission: RE | Admit: 2024-08-18 | Discharge: 2024-08-18 | Disposition: A | Discharge status: Home / Self Care | Source: Ambulatory Visit | Attending: Urology | Admitting: Urology

## 2024-08-18 ENCOUNTER — Encounter (HOSPITAL_COMMUNITY): Payer: Self-pay

## 2024-08-18 VITALS — BP 145/90 | HR 55 | Resp 16 | Ht 72.0 in | Wt 205.0 lb

## 2024-08-18 DIAGNOSIS — K449 Diaphragmatic hernia without obstruction or gangrene: Secondary | ICD-10-CM | POA: Diagnosis not present

## 2024-08-18 DIAGNOSIS — I340 Nonrheumatic mitral (valve) insufficiency: Secondary | ICD-10-CM | POA: Insufficient documentation

## 2024-08-18 DIAGNOSIS — I10 Essential (primary) hypertension: Secondary | ICD-10-CM | POA: Diagnosis not present

## 2024-08-18 DIAGNOSIS — I2510 Atherosclerotic heart disease of native coronary artery without angina pectoris: Secondary | ICD-10-CM | POA: Diagnosis not present

## 2024-08-18 DIAGNOSIS — C61 Malignant neoplasm of prostate: Secondary | ICD-10-CM | POA: Insufficient documentation

## 2024-08-18 DIAGNOSIS — I252 Old myocardial infarction: Secondary | ICD-10-CM | POA: Insufficient documentation

## 2024-08-18 DIAGNOSIS — K4090 Unilateral inguinal hernia, without obstruction or gangrene, not specified as recurrent: Secondary | ICD-10-CM | POA: Insufficient documentation

## 2024-08-18 DIAGNOSIS — R001 Bradycardia, unspecified: Secondary | ICD-10-CM | POA: Diagnosis not present

## 2024-08-18 DIAGNOSIS — Z951 Presence of aortocoronary bypass graft: Secondary | ICD-10-CM | POA: Insufficient documentation

## 2024-08-18 DIAGNOSIS — Z01812 Encounter for preprocedural laboratory examination: Secondary | ICD-10-CM | POA: Diagnosis not present

## 2024-08-18 HISTORY — DX: Other seasonal allergic rhinitis: J30.2

## 2024-08-18 HISTORY — DX: Malignant (primary) neoplasm, unspecified: C80.1

## 2024-08-18 LAB — CBC
HCT: 45.1 % (ref 39.0–52.0)
Hemoglobin: 15 g/dL (ref 13.0–17.0)
MCH: 32.1 pg (ref 26.0–34.0)
MCHC: 33.3 g/dL (ref 30.0–36.0)
MCV: 96.6 fL (ref 80.0–100.0)
Platelets: 158 K/uL (ref 150–400)
RBC: 4.67 MIL/uL (ref 4.22–5.81)
RDW: 12.4 % (ref 11.5–15.5)
WBC: 5.9 K/uL (ref 4.0–10.5)
nRBC: 0 % (ref 0.0–0.2)

## 2024-08-19 ENCOUNTER — Encounter (HOSPITAL_COMMUNITY): Payer: Self-pay

## 2024-08-19 NOTE — Anesthesia Preprocedure Evaluation (Signed)
 Anesthesia Evaluation  Patient identified by MRN, date of birth, ID band Patient awake    Reviewed: Allergy & Precautions, H&P , NPO status , Patient's Chart, lab work & pertinent test results  Airway Mallampati: I  TM Distance: >3 FB Neck ROM: Full    Dental no notable dental hx. (+) Poor Dentition, Missing, Dental Advisory Given, Partial Upper,    Pulmonary neg pulmonary ROS, former smoker   Pulmonary exam normal breath sounds clear to auscultation       Cardiovascular Exercise Tolerance: Good hypertension, Pt. on medications and Pt. on home beta blockers + angina  + CAD  negative cardio ROS Normal cardiovascular exam+ dysrhythmias Atrial Fibrillation  Rhythm:Regular Rate:Normal     Neuro/Psych negative neurological ROS  negative psych ROS   GI/Hepatic negative GI ROS, Neg liver ROS,GERD  ,,  Endo/Other  negative endocrine ROS    Renal/GU negative Renal ROS  negative genitourinary   Musculoskeletal negative musculoskeletal ROS (+) Arthritis ,    Abdominal   Peds negative pediatric ROS (+)  Hematology negative hematology ROS (+)   Anesthesia Other Findings   Reproductive/Obstetrics negative OB ROS                              Anesthesia Physical Anesthesia Plan  ASA: 4  Anesthesia Plan: General   Post-op Pain Management: Tylenol  PO (pre-op)* and Celebrex PO (pre-op)*   Induction: Intravenous  PONV Risk Score and Plan: 2 and Ondansetron , Dexamethasone and Treatment may vary due to age or medical condition  Airway Management Planned: LMA and Oral ETT  Additional Equipment: None  Intra-op Plan:   Post-operative Plan:   Informed Consent: I have reviewed the patients History and Physical, chart, labs and discussed the procedure including the risks, benefits and alternatives for the proposed anesthesia with the patient or authorized representative who has indicated his/her  understanding and acceptance.       Plan Discussed with: CRNA and Anesthesiologist  Anesthesia Plan Comments: (See PAT 16note from admission/10  DISCUSSION: Ruben Burton is an 83 yo male with PMH of former smoking, HTN, history of NSTEMI and CAD s/p CABG x 2 (2011), GERD, large hiatal hernia, postop A-fib not anticoagulated, arthritis, prostate cancer   Patient follows with cardiology for history of MI and CAD s/p CABG (LIMA to LAD and SVG to Diagonal) in 2011.  Also had postop A-fib and is now off anticoagulation.  Last cath in 2017 showed chronic occlusion of the mid LAD and patent grafts.  He was last seen by Dr. Verlin on 07/06/2024.  Noted to be doing well.  He denied any symptoms.  Echo was updated on 08/04/2024 and showed normal LVEF and mild mitral regurgitation.  He was given clearance for upcoming surgery:   Pre-operative cardiovascular examination: He is doing well from a cardiac standpoint. He can proceed with his planned prostate seed implants. OK to hold ASA for procedure. EKG 07/06/2024:   Sinus bradycardia Left axis deviation Nonspecific T wave abnormality   08/04/2024:   IMPRESSIONS      1. Left ventricular ejection fraction, by estimation, is 60 to 65%. The left ventricle has normal function. The left ventricle has no regional wall motion abnormalities. There is moderate asymmetric left ventricular hypertrophy of the basal-septal segment. Left ventricular diastolic parameters are indeterminate.  2. Right ventricular systolic function is normal. The right ventricular size is normal.  3. The mitral valve is normal in structure. Mild  mitral valve regurgitation. No evidence of mitral stenosis.  4. The aortic valve is tricuspid. Aortic valve regurgitation is not visualized. No aortic stenosis is present.   Left heart cath 10/16/2016:     The left ventricular ejection fraction is 50-55% by visual estimate. The left ventricular systolic function is normal. LV end  diastolic pressure is normal. There is no mitral valve regurgitation. Ost 1st Diag lesion, 99 %stenosed. SVG graft was visualized by angiography. LIMA graft was visualized by angiography. Mid LAD lesion, 100 %stenosed. Ost RCA to Prox RCA lesion, 40 %stenosed. Prox RCA to Mid RCA lesion, 30 %stenosed. Dist RCA lesion, 20 %stenosed.   1. 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 Patent LIMA graft to the mid LAD. The large Diagonal branch has severe ostial stenosis. The vein graft is patent and fills the Diagonal.  2. Moderate non-obstructive disease in the RCA 3. No disease noted in the Circumflex.  4. Normal LV systolic function   )         Anesthesia Quick Evaluation

## 2024-08-19 NOTE — Progress Notes (Signed)
 Case: [id number] Date/Time: 08/25/24 0715   Procedures:      INSERTION, RADIATION SOURCE, PROSTATE     INJECTION, HYDROGEL SPACER   Anesthesia type: General   Diagnosis: Prostate cancer (HCC) [C61]   Pre-op diagnosis: PROSTATE CANCER   Location: [facility] / [facility]   Surgeons: Renda Glance, MD       DISCUSSION: Ruben Burton is an 83 yo male with PMH of former smoking, HTN, history of NSTEMI and CAD s/p CABG x 2 (2011), GERD, large hiatal hernia, postop A-fib not anticoagulated, arthritis, prostate cancer  Patient follows with cardiology for history of MI and CAD s/p CABG (LIMA to LAD and SVG to Diagonal) in 2011.  Also had postop A-fib and is now off anticoagulation.  Last cath in 2017 showed chronic occlusion of the mid LAD and patent grafts.  He was last seen by Dr. Verlin on 07/06/2024.  Noted to be doing well.  He denied any symptoms.  Echo was updated on 08/04/2024 and showed normal LVEF and mild mitral regurgitation.  He was given clearance for upcoming surgery:  Pre-operative cardiovascular examination: He is doing well from a cardiac standpoint. He can proceed with his planned prostate seed implants. OK to hold ASA for procedure.  VS: BP (!) 145/90   Pulse (!) 55   Resp 16   Ht 6' (1.829 m)   Wt 93 kg   SpO2 100%   BMI 27.80 kg/m   PROVIDERS: Loreli Elsie JONETTA Mickey., MD   LABS: Labs reviewed: Acceptable for surgery. (all labs ordered are listed, but only abnormal results are displayed)  Labs Reviewed  CBC     PET scan 03/17/2024:    IMPRESSION: Focus of radiotracer accumulation in the left lateral prostate, consistent with primary prostate carcinoma.   No evidence of metastatic disease.   Moderate to large hiatal hernia.   Small fat-containing left inguinal hernia.  EKG 07/06/2024:  Sinus bradycardia Left axis deviation Nonspecific T wave abnormality  08/04/2024:  IMPRESSIONS    1. Left ventricular ejection fraction, by estimation, is 60 to  65%. The left ventricle has normal function. The left ventricle has no regional wall motion abnormalities. There is moderate asymmetric left ventricular hypertrophy of the basal-septal segment. Left ventricular diastolic parameters are indeterminate.  2. Right ventricular systolic function is normal. The right ventricular size is normal.  3. The mitral valve is normal in structure. Mild mitral valve regurgitation. No evidence of mitral stenosis.  4. The aortic valve is tricuspid. Aortic valve regurgitation is not visualized. No aortic stenosis is present.  Left heart cath 10/16/2016:   The left ventricular ejection fraction is 50-55% by visual estimate. The left ventricular systolic function is normal. LV end diastolic pressure is normal. There is no mitral valve regurgitation. Ost 1st Diag lesion, 99 %stenosed. SVG graft was visualized by angiography. LIMA graft was visualized by angiography. Mid LAD lesion, 100 %stenosed. Ost RCA to Prox RCA lesion, 40 %stenosed. Prox RCA to Mid RCA lesion, 30 %stenosed. Dist RCA lesion, 20 %stenosed.   1. 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 Patent LIMA graft to the mid LAD. The large Diagonal branch has severe ostial stenosis. The vein graft is patent and fills the Diagonal.  2. Moderate non-obstructive disease in the RCA 3. No disease noted in the Circumflex.  4. Normal LV systolic function   Recommendations: Continue medical management of CAD.    Past Medical History:  Diagnosis Date   Arthritis  mild; fingers (10/16/2016)   CAD, ARTERY BYPASS GRAFT 03/07/2010   Qualifier: Diagnosis of  By: Wynetta, CMA, Carol     Cancer [facility])    skin- multiple   Coronary artery disease    a. CABG in 2011 b. Cath 10/16/16 - chronic total occlusion of mid LAD with patietn LIMA to mid LAD; severe ostial stenosis to large diagonal with patent SVG to diagonal; moderate non obstrctive RCA, normal LV function. Tx Rx   Elevated  PSA    GERD (gastroesophageal reflux disease)    Hx of cardiovascular stress test    a. ETT-MV 1/14: no ischemia; not gated   Hyperlipidemia    Hypertension    Paroxysmal atrial fibrillation (HCC)    PLEURAL EFFUSION, LEFT 03/07/2010   Qualifier: Diagnosis of  By: Wynetta, CMA, Carol     Precordial pain    Seasonal allergies    Unstable angina pectoris (HCC) 10/16/2016    Past Surgical History:  Procedure Laterality Date   BIOPSY  09/16/2019   Procedure: BIOPSY;  Surgeon: Golda Claudis PENNER, MD;  [facility];  Service: Endoscopy;;  gastric    CARDIAC CATHETERIZATION  ~ 2011; 10/16/2016   CARDIAC CATHETERIZATION N/A 10/16/2016   Procedure: Left Heart Cath and Cors/Grafts Angiography;  Surgeon: Lonni JONETTA Cash, MD;  Location: [facility] INVASIVE CV LAB;  Service: Cardiovascular;  Laterality: N/A;   CHOLECYSTECTOMY OPEN  ~ 1992   COLONOSCOPY N/A 12/01/2013   Procedure: COLONOSCOPY;  Surgeon: Claudis PENNER Golda, MD;  [facility];  Service: Endoscopy;  Laterality: N/A;  830   COLONOSCOPY WITH PROPOFOL  N/A 09/16/2019   Procedure: COLONOSCOPY WITH PROPOFOL ;  Surgeon: Golda Claudis PENNER, MD;  [facility];  Service: Endoscopy;  Laterality: N/A;   CORONARY ARTERY BYPASS GRAFT  02/13/2010   2V (LIMA to LAD, SVG to diagonal)   ESOPHAGOGASTRODUODENOSCOPY (EGD) WITH ESOPHAGEAL DILATION     ESOPHAGOGASTRODUODENOSCOPY (EGD) WITH PROPOFOL  N/A 09/16/2019   Procedure: ESOPHAGOGASTRODUODENOSCOPY (EGD) WITH PROPOFOL ;  Surgeon: Golda Claudis PENNER, MD;  [facility];  Service: Endoscopy;  Laterality: N/A;  955am   EXPLORATORY LAPAROTOMY  ~ 1995   for perforated duodenal ulcer   HYDROCELE EXCISION     POLYPECTOMY  09/16/2019   Procedure: POLYPECTOMY;  Surgeon: Golda Claudis PENNER, MD;  [facility];  Service: Endoscopy;;  colon   PROSTATE BIOPSY      MEDICATIONS:  aspirin  EC 81 MG tablet   B Complex-C (B-COMPLEX WITH VITAMIN C) tablet   calcium  carbonate  (OS-CAL) 1250 (500 Ca) MG chewable tablet   Cholecalciferol (VITAMIN D-1000 MAX ST) 25 MCG (1000 UT) tablet   Cyanocobalamin  (B-12) 1000 MCG TABS   famotidine (PEPCID) 20 MG tablet   metoprolol  (LOPRESSOR ) 25 MG tablet   nitroGLYCERIN  (NITROSTAT ) 0.4 MG SL tablet   rosuvastatin  (CRESTOR ) 20 MG tablet   tamsulosin  (FLOMAX ) 0.4 MG CAPS capsule   No current facility-administered medications for this encounter.   Burnard CHRISTELLA Odis DEVONNA [facility] Short Stay/Anesthesiology [facility] Phone [phone] 08/19/2024 11:06 AM

## 2024-08-24 ENCOUNTER — Telehealth: Payer: Self-pay | Admitting: *Deleted

## 2024-08-24 NOTE — Telephone Encounter (Signed)
 CALLED PATIENT TO REMIND OF PROCEDURE FOR 08-25-24, SPOKE WITH PATIENT AND HE IS AWARE OF THIS PROCEDURE

## 2024-08-24 NOTE — H&P (Signed)
 Visit Note - August 16, 2024 [place], Bula MRN: [id number] Phone: [phone] DOB: Oct 28, 1941 Sex: Male PMS ID: [id number] [facility] (Primary Provider) (Bill Under) Page 1 [phone] Work [phone] Fax [facility] [address] [place] janifer [place], [place] [address] Social History Obtained and Reviewed August 16, 2024. Single Question Alcohol  Screening: 0 days EtOH none Smoking status - Never smoker Tobacco Use Does not use vaping products Does not use smokeless tobacco Tobacco Product: None Medications Reviewed and changes noted August 16, 2024. metoprolol  succinate 25 mg Oral - tablet, extended release 24 hr rosuvastatin  20 mg Oral - tablet tamsulosin  0.4 mg Oral - capsule B12 sublingual Dose: Mcg Nitrostat  0.4 mg Sublingual - Dose: As Needed tablet, sublingual Allergies Obtained and Reviewed August 16, 2024. Terramycin - Dizziness - Other: Passed out Alerts Currently taking blood thinners. No allergy to latex. ROS Provider reviewed on Aug 16, 2024. A focused review of systems was performed including Cardiovascular, Constitutional / Symptom, Gastrointestinal (G.I.), Genitourinary (G.U.), Musculoskeletal, and Respiratory and was notable for Urinary incontinence, Back pain, and Swelling in legs. No Blood In Urine, No Pain Or Burning With Urination, No Testicular Pain/testicular Swelling, No Chest Pain, No Fever Or Chills, No Unintentional Weight Loss, No Abdominal Pain, No Nausea/vomiting, And No Shortness Of Breath. Medical History Obtained and Reviewed August 16, 2024. Arthritis H/O: hypertension Malignant tumor of prostate Myocardial infarction Prostate specific antigen above reference range Surgical History Obtained and Reviewed August 16, 2024. Biopsy of prostate Other: Intestinal Surgery, Conventional Rhinoplasty, Elective Circumcision, Cholecystectomy   CC/HPI: Pt presents today for H/P in  anticipation of radiation seed and space oar placement by Dr. Renda on 08/25/24. He is doing well and is without complaint. Pt denies F/C, HA, CP, SOB, N/V, diarrhea/constipation, hematuria, dysuria, and flank pain. Historical Summary: CC: Prostate Cancer PCP: Mr. Oleksy is an 83 year old gentleman who presented to me in March with an elevated PSA of 7.0 and significant induration of the left lateral aspect of the prostate. A prostate MRI had been performed in February that indicated a 1.3 cm PI-RADS 4 lesion consistent with his palpable abnormality. He underwent an MR/US  fusion biopsy on 03/04/24 that confirmed Gleason 4+5=9 adenocarcinoma with all 4 targeted biopsies positive. Family history: None. Imaging studies: PSMA PET scan (03/17/24) - No metastatic disease. PMH: He has a history of hypertension, GERD, and hyperlipidemia. PSH: Cholecystectomy and small bowel resection. TNM stage: cT2b N0 M0 PSA: 7.0 Gleason score: 4+5=9 (GG 5) Biopsy (03/04/24): 9/16 cores positive Left: L lateral apex (70%, 4+5=9, PNI), L apex (70%, 4+5=9), L lateral mid (80%, 4+5=9, PNI), L mid (80%, 4+5=9) Right: R apex (10%, 3+3=6) ROI: 4/4 cores positive (4+5=9 in 80%, 70%, 90%, and 70%, PNI) Prostate volume: 28.5 cc Urinary function: IPSS is 9. He does take tamsulosin  0.4 mg Erectile function: SHIM score is 17. Vitals: Date Taken By B.P. Pulse Resp. O2 Sat. Temp. Ht. Wt. BMI BSA 08/16/24 13:39 LEWIS, ASHLEY 157/89 SIT 57 0 0 FiO2 * Patient Reported Exam: Exam Appearance: well developed and nourished, in no acute distress Neck Exam: neck is supple Respiratory Effort: normal respiratory effort without labored breathing Heart Auscultation Exam: normal heart auscultation Lower Extremity Venous: normal right lower extremity exam without cyanosis or edema; normal left lower extremity exam without cyanosis or edema  Skin Inspection: normal skin turgor Orientation: alert and oriented to person, place,  time Visit Note - August 16, 2024 Mathieu, Schloemer MRN: [id number] Phone: [phone] DOB: 1941-07-06 Sex:  Male PMS ID: [id number] [facility] (Primary Provider) (Bill Under) Page 2 [phone] Work [phone] Fax [facility] [address] [place] [address] [place], [place] [address] NON-GU PSH: Cholecystectomy (open) - 2009 Heart Surgery (Unspecified), open - 2011 Reconstruct Nose - 2009 Remove Small Intestine, duodenum - 1990 Surgical Pathology, Gross And Microscopic Examination For Prostate Needle - 03/04/2024 Unlisted Procedure Intestine - 2009 Visit Complexity (formerly GPC1X) - 12/18/2023 Mood: mood and affect well-adjusted, pleasant and cooperative, appropriate for clinical and encounter circumstances Impression/Plan: Prostate Cancer (C61) Plan: Follow-up as scheduled. The patient will follow-up as scheduled. Plan: Additional Notes. Pt history and physical exam has not changed since last eval by Dr. Renda. Proceed with surgery as planned on 08/25/24. All pt's questions answered to the best of my ability. Plan: Lab reports reviewed. 1. Follow up - (as previously scheduled) Staff: ALAN HAMMONDS (Primary Provider) Cherylin Under) ROSINA KERNS Electronically Signed By: ALAN HAMMONDS, 08/16/2024 04:31 PM EDT

## 2024-08-25 ENCOUNTER — Other Ambulatory Visit: Payer: Self-pay | Admitting: Urology

## 2024-08-25 ENCOUNTER — Ambulatory Visit (HOSPITAL_COMMUNITY): Payer: Self-pay | Admitting: Anesthesiology

## 2024-08-25 ENCOUNTER — Encounter (HOSPITAL_COMMUNITY): Admission: RE | Disposition: A | Payer: Self-pay | Source: Home / Self Care | Attending: Urology

## 2024-08-25 ENCOUNTER — Ambulatory Visit (HOSPITAL_COMMUNITY)
Admission: RE | Admit: 2024-08-25 | Discharge: 2024-08-25 | Disposition: A | Discharge status: Home / Self Care | Attending: Urology | Admitting: Urology

## 2024-08-25 ENCOUNTER — Encounter (HOSPITAL_COMMUNITY): Payer: Self-pay | Admitting: Urology

## 2024-08-25 ENCOUNTER — Ambulatory Visit (HOSPITAL_COMMUNITY): Payer: Self-pay | Admitting: Physician Assistant

## 2024-08-25 ENCOUNTER — Ambulatory Visit (HOSPITAL_COMMUNITY)

## 2024-08-25 ENCOUNTER — Other Ambulatory Visit: Payer: Self-pay

## 2024-08-25 DIAGNOSIS — I10 Essential (primary) hypertension: Secondary | ICD-10-CM | POA: Diagnosis not present

## 2024-08-25 DIAGNOSIS — Z79899 Other long term (current) drug therapy: Secondary | ICD-10-CM | POA: Diagnosis not present

## 2024-08-25 DIAGNOSIS — I25119 Atherosclerotic heart disease of native coronary artery with unspecified angina pectoris: Secondary | ICD-10-CM | POA: Diagnosis not present

## 2024-08-25 DIAGNOSIS — I2510 Atherosclerotic heart disease of native coronary artery without angina pectoris: Secondary | ICD-10-CM

## 2024-08-25 DIAGNOSIS — C61 Malignant neoplasm of prostate: Secondary | ICD-10-CM | POA: Insufficient documentation

## 2024-08-25 DIAGNOSIS — Z87891 Personal history of nicotine dependence: Secondary | ICD-10-CM

## 2024-08-25 DIAGNOSIS — Z191 Hormone sensitive malignancy status: Secondary | ICD-10-CM | POA: Diagnosis not present

## 2024-08-25 DIAGNOSIS — I4891 Unspecified atrial fibrillation: Secondary | ICD-10-CM | POA: Insufficient documentation

## 2024-08-25 HISTORY — PX: SPACE OAR INSTILLATION: SHX6769

## 2024-08-25 HISTORY — PX: RADIOACTIVE SEED IMPLANT: SHX5150

## 2024-08-25 LAB — BASIC METABOLIC PANEL WITH GFR
Anion gap: 9 (ref 5–15)
BUN: 17 mg/dL (ref 8–23)
CO2: 27 mmol/L (ref 22–32)
Calcium: 9.5 mg/dL (ref 8.9–10.3)
Chloride: 109 mmol/L (ref 98–111)
Creatinine, Ser: 1.1 mg/dL (ref 0.61–1.24)
GFR, Estimated: >60 mL/min (ref >60)
Glucose, Bld: 109 mg/dL — ABNORMAL HIGH (ref 70–99)
Potassium: 4.8 mmol/L (ref 3.5–5.1)
Sodium: 144 mmol/L (ref 135–145)

## 2024-08-25 SURGERY — INSERTION, RADIATION SOURCE, PROSTATE
Anesthesia: General | Site: Prostate

## 2024-08-25 MED ORDER — LIDOCAINE HCL (PF) 2 % IJ SOLN
INTRAMUSCULAR | Status: DC | PRN
  Administered 2024-08-25: 100 mg via INTRADERMAL

## 2024-08-25 MED ORDER — ONDANSETRON HCL 4 MG/2ML IJ SOLN: 4.0000 mg | Freq: Once | INTRAMUSCULAR | Status: DC | PRN

## 2024-08-25 MED ORDER — FENTANYL CITRATE (PF) 100 MCG/2ML IJ SOLN
INTRAMUSCULAR | Status: DC | PRN
  Administered 2024-08-25 (×2): 50 ug via INTRAVENOUS

## 2024-08-25 MED ORDER — MEPERIDINE HCL 100 MG/ML IJ SOLN: 6.2500 mg | INTRAMUSCULAR | Status: DC | PRN

## 2024-08-25 MED ORDER — ACETAMINOPHEN 500 MG PO TABS
1000.0000 mg | ORAL_TABLET | Freq: Once | ORAL | Status: AC
  Administered 2024-08-25: 1000 mg via ORAL
  Filled 2024-08-25: qty 2

## 2024-08-25 MED ORDER — SODIUM CHLORIDE (PF) 0.9 % IJ SOLN
INTRAMUSCULAR | Status: DC | PRN
  Administered 2024-08-25: 10 mL

## 2024-08-25 MED ORDER — STERILE WATER FOR IRRIGATION IR SOLN
Status: DC | PRN
  Administered 2024-08-25: 1000 mL

## 2024-08-25 MED ORDER — FLEET ENEMA RE ENEM: 1.0000 | ENEMA | Freq: Once | RECTAL | Status: DC

## 2024-08-25 MED ORDER — TRAMADOL HCL 50 MG PO TABS: 50.0000 mg | ORAL_TABLET | Freq: Four times a day (QID) | ORAL | 0 refills | Status: AC | PRN

## 2024-08-25 MED ORDER — ROCURONIUM BROMIDE 10 MG/ML (PF) SYRINGE
PREFILLED_SYRINGE | INTRAVENOUS | Status: AC
  Filled 2024-08-25: qty 10

## 2024-08-25 MED ORDER — CHLORHEXIDINE GLUCONATE 0.12 % MT SOLN
15.0000 mL | Freq: Once | OROMUCOSAL | Status: AC
  Administered 2024-08-25: 15 mL via OROMUCOSAL

## 2024-08-25 MED ORDER — ROCURONIUM BROMIDE 10 MG/ML (PF) SYRINGE
PREFILLED_SYRINGE | INTRAVENOUS | Status: DC | PRN
Start: 2024-08-25 — End: 2024-08-25
  Administered 2024-08-25: 50 mg via INTRAVENOUS

## 2024-08-25 MED ORDER — LACTATED RINGERS IV SOLN: INTRAVENOUS | Status: DC

## 2024-08-25 MED ORDER — SUGAMMADEX SODIUM 200 MG/2ML IV SOLN
INTRAVENOUS | Status: DC | PRN
  Administered 2024-08-25: 200 mg via INTRAVENOUS

## 2024-08-25 MED ORDER — PROPOFOL 10 MG/ML IV BOLUS
INTRAVENOUS | Status: AC
  Filled 2024-08-25: qty 20

## 2024-08-25 MED ORDER — OXYCODONE HCL 5 MG PO TABS: 5.0000 mg | ORAL_TABLET | Freq: Once | ORAL | Status: DC | PRN

## 2024-08-25 MED ORDER — PROPOFOL 10 MG/ML IV BOLUS
INTRAVENOUS | Status: DC | PRN
  Administered 2024-08-25: 100 mg via INTRAVENOUS

## 2024-08-25 MED ORDER — PHENYLEPHRINE HCL-NACL 20-0.9 MG/250ML-% IV SOLN
INTRAVENOUS | Status: DC | PRN
  Administered 2024-08-25: 30 ug/min via INTRAVENOUS

## 2024-08-25 MED ORDER — LIDOCAINE HCL (PF) 2 % IJ SOLN
INTRAMUSCULAR | Status: AC
  Filled 2024-08-25: qty 5

## 2024-08-25 MED ORDER — CIPROFLOXACIN IN D5W 400 MG/200ML IV SOLN
400.0000 mg | INTRAVENOUS | Status: AC
  Administered 2024-08-25: 400 mg via INTRAVENOUS
  Filled 2024-08-25: qty 200

## 2024-08-25 MED ORDER — CELECOXIB 200 MG PO CAPS
200.0000 mg | ORAL_CAPSULE | Freq: Once | ORAL | Status: AC
  Administered 2024-08-25: 200 mg via ORAL
  Filled 2024-08-25: qty 1

## 2024-08-25 MED ORDER — ORAL CARE MOUTH RINSE: 15.0000 mL | Freq: Once | OROMUCOSAL | Status: AC

## 2024-08-25 MED ORDER — FENTANYL CITRATE (PF) 100 MCG/2ML IJ SOLN
INTRAMUSCULAR | Status: AC
  Filled 2024-08-25: qty 2

## 2024-08-25 MED ORDER — SODIUM CHLORIDE (PF) 0.9 % IJ SOLN
INTRAMUSCULAR | Status: AC
  Filled 2024-08-25: qty 10

## 2024-08-25 MED ORDER — FENTANYL CITRATE (PF) 50 MCG/ML IJ SOSY: 25.0000 ug | PREFILLED_SYRINGE | INTRAMUSCULAR | Status: DC | PRN

## 2024-08-25 MED ORDER — EPHEDRINE SULFATE-NACL 50-0.9 MG/10ML-% IV SOSY
PREFILLED_SYRINGE | INTRAVENOUS | Status: DC | PRN
  Administered 2024-08-25: 10 mg via INTRAVENOUS
  Administered 2024-08-25 (×2): 5 mg via INTRAVENOUS

## 2024-08-25 MED ORDER — IOHEXOL 300 MG/ML  SOLN
INTRAMUSCULAR | Status: DC | PRN
  Administered 2024-08-25: 7 mL

## 2024-08-25 MED ORDER — OXYCODONE HCL 5 MG/5ML PO SOLN: 5.0000 mg | Freq: Once | ORAL | Status: DC | PRN

## 2024-08-25 MED ORDER — SUGAMMADEX SODIUM 200 MG/2ML IV SOLN
INTRAVENOUS | Status: AC
  Filled 2024-08-25: qty 2

## 2024-08-25 SURGICAL SUPPLY — 29 items
BAG COUNTER SPONGE SURGICOUNT (BAG) IMPLANT
BAG URINE DRAIN 2000ML AR STRL (UROLOGICAL SUPPLIES) ×1 IMPLANT
CATH FOLEY 2WAY SLVR 5CC 16FR (CATHETERS) ×2 IMPLANT
CATH ROBINSON RED A/P 20FR (CATHETERS) ×1 IMPLANT
COVER MAYO STAND STRL (DRAPES) ×1 IMPLANT
DRAPE U-SHAPE 47X51 STRL (DRAPES) ×1 IMPLANT
DRSG TEGADERM 4X4.75 (GAUZE/BANDAGES/DRESSINGS) ×1 IMPLANT
DRSG TEGADERM 8X12 (GAUZE/BANDAGES/DRESSINGS) ×1 IMPLANT
GLOVE BIO SURGEON STRL SZ7.5 (GLOVE) ×1 IMPLANT
GLOVE SURG LX STRL 7.5 STRW (GLOVE) ×2 IMPLANT
GOWN STRL REUS W/ TWL XL LVL3 (GOWN DISPOSABLE) ×1 IMPLANT
GRID BRACH TEMP 18GA 2.8X3X.75 (MISCELLANEOUS) ×1 IMPLANT
HOLDER FOLEY CATH W/STRAP (MISCELLANEOUS) ×1 IMPLANT
IMPL SPACEOAR SYSTEM 10ML (Spacer) ×1 IMPLANT
KIT TURNOVER KIT A (KITS) ×1 IMPLANT
MARKER SKIN DUAL TIP RULER LAB (MISCELLANEOUS) ×1 IMPLANT
NDL BRACHY 18G 5PK (NEEDLE) ×4 IMPLANT
NDL BRACHY 18G SINGLE (NEEDLE) IMPLANT
NDL PK MORGANSTERN STABILIZ (NEEDLE) ×1 IMPLANT
NEEDLE BRACHY 18G 5PK (NEEDLE) ×4 IMPLANT
NEEDLE BRACHY 18G SINGLE (NEEDLE) ×1 IMPLANT
NEEDLE PK MORGANSTERN STABILIZ (NEEDLE) ×1 IMPLANT
PACK CYSTO (CUSTOM PROCEDURE TRAY) ×1 IMPLANT
PENCIL SMOKE EVACUATOR (MISCELLANEOUS) IMPLANT
Quicklink Cartridges-brachysource IMPLANT
SURGILUBE 2OZ TUBE FLIPTOP (MISCELLANEOUS) ×1 IMPLANT
SYR 10ML LL (SYRINGE) ×1 IMPLANT
TOWEL OR 17X26 10 PK STRL BLUE (TOWEL DISPOSABLE) ×1 IMPLANT
UNDERPAD 30X36 HEAVY ABSORB (UNDERPADS AND DIAPERS) ×1 IMPLANT

## 2024-08-25 NOTE — Op Note (Signed)
 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 M.D.  Radiation oncologist: Donnice Barge, MD  Anesthesia: General  EBL: Minimal  Complications: None  Indication: Ruben Burton is a 83 y.o. gentleman with clinically localized prostate cancer. After discussing management options for treatment, he elected to proceed with radiotherapy. He presents today for the above procedures. The potential risks, complications, alternative options, and expected recovery course have been discussed in detail with the patient and he has provided informed consent to proceed.  Description of procedure: The patient was taken to the operating room and general anesthesia was induced. He was administered preoperative antibiotics, placed in the dorsal lithotomy position, and prepped and draped in the usual sterile fashion. Next, intraoperative transrectal ultrasonography was utilized for real-time intraoperative planning by the radiation oncology team. Once the treatment plan was completed and the seed strands created, stranded iodine 125 radiation seeds were placed utilizing a brachytherapy perineal template. 68 radioactive iodine 125 seeds into the prostate through 16 catheter needles.  The brachytherapy template was then removed.  A site in the midline was selected on the perineum for placement of an 18 g needle with saline.  The needle was advanced above the rectum and below Denonvillier's fascia to the mid gland and confirmed to be in the midline on transverse imaging.  One cc of saline was injected confirming appropriate expansion of this space.  A total of 5 cc of saline was then injected to open the  space further bilaterally.  The saline syringe was then removed and the SpaceOAR hydrogel was injected with good distribution bilaterally. Position of the radiation seeds was confirmed on fluoroscopic imaging.  Flexible cystoscopy was then performed and no seeds were identified within the bladder.  No bladder tumors, stones, or other mucosal pathology was identified within the bladder. He tolerated the procedure well and without complications. He was able to be transferred to the recovery unit in satisfactory condition.  He was given a voiding trial in the PACU.

## 2024-08-25 NOTE — Progress Notes (Signed)
  Radiation Oncology         [phone] ________________________________  Name: Ruben Burton MRN: [id number]  Date: 08/25/2024  DOB: 1940-12-29       Prostate Seed Implant  Ruben Burton, Ruben JONETTA Raddle., MD  No ref. provider found  DIAGNOSIS:  83 y.o. 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   Oncology History  Malignant neoplasm of prostate (HCC)  03/04/2024 Cancer Staging   Staging form: Prostate, AJCC 8th Edition - Clinical stage from 03/04/2024: Stage IIIC (cT2b, cN0, cM0, PSA: 7, Grade Group: 5) - Signed by Sherwood Rise, PA-C on 07/28/2024 Histopathologic type: Adenocarcinoma, NOS Stage prefix: Initial diagnosis Prostate specific antigen (PSA) range: Less than 10 Gleason primary pattern: 4 Gleason secondary pattern: 5 Gleason score: 9 Histologic grading system: 5 grade system Number of biopsy cores examined: 16 Number of biopsy cores positive: 9 Location of positive needle core biopsies: Both sides   05/10/2024 Initial Diagnosis   Malignant neoplasm of prostate (HCC)       ICD-10-CM   1. HYPERTENSION  I10 Basic metabolic panel with GFR    Basic metabolic panel with GFR      PROCEDURE: Insertion of radioactive I-125 seeds into the prostate gland.  RADIATION DOSE: 110 Gy, boost therapy.  TECHNIQUE: Ruben Burton was brought to the operating room with the urologist. He was placed in the dorsolithotomy position. He was catheterized and a rectal tube was inserted. The perineum was shaved, prepped and draped. The ultrasound probe was then introduced by me into the rectum to see the prostate gland.  TREATMENT DEVICE: I attached the needle grid to the ultrasound probe stand and anchor needles were placed.  3D PLANNING: The prostate was imaged in 3D using a sagittal sweep of the prostate probe. These images were transferred to the planning computer. There, the prostate, urethra and rectum were defined on each axial  reconstructed image. Then, the software created an optimized 3D plan and a few seed positions were adjusted. The quality of the plan was reviewed using [employer] [facility] [employer] information for the target and the following two organs at risk:  Urethra and Rectum.  Then the accepted plan was printed and handed off to the radiation therapist.  Under my supervision, the custom loading of the seeds and spacers was carried out using the quick loader.  These pre-loaded needles were then placed into the needle holder.Ruben Burton  PROSTATE VOLUME STUDY:  Using transrectal ultrasound the volume of the prostate was verified to be 21.8 cc.  SPECIAL TREATMENT PROCEDURE/SUPERVISION AND HANDLING: The pre-loaded needles were then delivered by the urologist under sagittal guidance. A total of 16 needles were used to deposit 68 seeds in the prostate gland. The individual seed activity was 0.208 mCi.  SpaceOAR:  Yes  COMPLEX SIMULATION: At the end of the procedure, an anterior radiograph of the pelvis was obtained to document seed positioning and count. Cystoscopy was performed by the urologist to check the urethra and bladder.  MICRODOSIMETRY: At the end of the procedure, the patient was emitting 0.04 mR/hr at 1 meter. Accordingly, he was considered safe for hospital discharge.  PLAN: The patient will return to the radiation oncology clinic for post implant CT dosimetry in three weeks.   ________________________________  Ruben Burton, M.D.

## 2024-08-25 NOTE — Discharge Instructions (Addendum)
 You should call Dr. Griselda office [phone]) if you feel you cannot empty your bladder well. Also, call if you develop fever > 101.  You will also be prescribed pain medication and should take an over the counter stool softener over the next week to avoid straining with bowel movements.  Followup with Dr. Renda and your radiation oncologist as scheduled.

## 2024-08-25 NOTE — Anesthesia Procedure Notes (Signed)
 Procedure Name: Intubation Date/Time: 08/25/2024 7:43 AM  Performed by: Lanning Cena RAMAN, CRNAPre-anesthesia Checklist: Patient identified, Suction available, Emergency Drugs available and Patient being monitored Patient Re-evaluated:Patient Re-evaluated prior to induction Oxygen Delivery Method: Circle system utilized Preoxygenation: Pre-oxygenation with 100% oxygen Induction Type: IV induction Laryngoscope Size: Miller and 3 Grade View: Grade I Tube type: Oral Tube size: 7.5 mm Number of attempts: 1 Airway Equipment and Method: Stylet Placement Confirmation: ETT inserted through vocal cords under direct vision, positive ETCO2, CO2 detector and breath sounds checked- equal and bilateral Secured at: 22 cm Tube secured with: Tape Dental Injury: Teeth and Oropharynx as per pre-operative assessment

## 2024-08-25 NOTE — Interval H&P Note (Signed)
 History and Physical Interval Note:  08/25/2024 5:25 AM  Ruben Burton  has presented today for surgery, with the diagnosis of PROSTATE CANCER.  The various methods of treatment have been discussed with the patient and family. After consideration of risks, benefits and other options for treatment, the patient has consented to  Procedure(s): INSERTION, RADIATION SOURCE, PROSTATE (N/A) INJECTION, HYDROGEL SPACER (N/A) as a surgical intervention.  The patient's history has been reviewed, patient examined, no change in status, stable for surgery.  I have reviewed the patient's chart and labs.  Questions were answered to the patient's satisfaction.     [employer]

## 2024-08-25 NOTE — Transfer of Care (Signed)
 Immediate Anesthesia Transfer of Care Note  Patient: Ruben Burton  Procedure(s) Performed: INSERTION, RADIATION SOURCE, PROSTATE (Prostate) INJECTION, HYDROGEL SPACER (Prostate)  Patient Location: PACU  Anesthesia Type:General  Level of Consciousness: awake, alert , and oriented  Airway & Oxygen Therapy: Patient Spontanous Breathing and Patient connected to face mask oxygen  Post-op Assessment: Report given to RN and Post -op Vital signs reviewed and stable  Post vital signs: Reviewed and stable  Last Vitals:  Vitals Value Taken Time  BP 141/88 08/25/24 08:46  Temp    Pulse 62 08/25/24 08:49  Resp 14 08/25/24 08:49  SpO2 99 % 08/25/24 08:49  Vitals shown include unfiled device data.  Last Pain:  Vitals:   08/25/24 0606  TempSrc:   PainSc: 0-No pain      Patients Stated Pain Goal: 5 (08/25/24 0559)  Complications: No notable events documented.

## 2024-08-25 NOTE — Anesthesia Procedure Notes (Signed)
 Procedure Name: LMA Insertion Date/Time: 08/25/2024 7:31 AM  Performed by: Lanning Cena RAMAN, CRNAPre-anesthesia Checklist: Patient identified, Emergency Drugs available, Suction available and Patient being monitored Patient Re-evaluated:Patient Re-evaluated prior to induction Oxygen Delivery Method: Circle system utilized Preoxygenation: Pre-oxygenation with 100% oxygen Induction Type: IV induction LMA: LMA inserted LMA Size: 5.0 Number of attempts: 1 Placement Confirmation: positive ETCO2 and breath sounds checked- equal and bilateral Tube secured with: Tape Dental Injury: Teeth and Oropharynx as per pre-operative assessment

## 2024-08-25 NOTE — Anesthesia Postprocedure Evaluation (Signed)
 Anesthesia Post Note  Patient: Ruben Burton  Procedure(s) Performed: INSERTION, RADIATION SOURCE, PROSTATE (Prostate) INJECTION, HYDROGEL SPACER (Prostate)     Patient location during evaluation: PACU Anesthesia Type: General Level of consciousness: awake and alert Pain management: pain level controlled Vital Signs Assessment: post-procedure vital signs reviewed and stable Respiratory status: spontaneous breathing, nonlabored ventilation, respiratory function stable and patient connected to nasal cannula oxygen Cardiovascular status: blood pressure returned to baseline and stable Postop Assessment: no apparent nausea or vomiting Anesthetic complications: no   No notable events documented.  Last Vitals:  Vitals:   08/25/24 0846 08/25/24 0900  BP: (!) 141/88 136/82  Pulse: 67 61  Resp: 13 17  Temp: 36.4 C   SpO2: 99% 95%    Last Pain:  Vitals:   08/25/24 0900  TempSrc:   PainSc: 0-No pain                 Souleymane Saiki

## 2024-08-26 ENCOUNTER — Encounter (HOSPITAL_COMMUNITY): Payer: Self-pay | Admitting: Urology

## 2024-09-01 ENCOUNTER — Telehealth: Payer: Self-pay | Admitting: *Deleted

## 2024-09-01 NOTE — Telephone Encounter (Signed)
 Returned patient's phone call, spoke with patient's wife- Ruben Burton

## 2024-09-02 ENCOUNTER — Telehealth: Payer: Self-pay | Admitting: *Deleted

## 2024-09-02 NOTE — Telephone Encounter (Signed)
 Called patient to inform that post op appt. for her husband has not been arranged yet with Dr. Griselda Office, lvm for a return call

## 2024-09-07 ENCOUNTER — Telehealth: Payer: Self-pay | Admitting: *Deleted

## 2024-09-07 NOTE — Telephone Encounter (Signed)
 Called patient to remind of sim/post seed appt. for 09-08-24- arrival time- 8:15 am @ [facility], reminded patient to be at hospital on 09-08-24 for MRI @ 9:30 am, spoke with patient's wife- Orlean and she is aware of these appts. and the instructions

## 2024-09-08 ENCOUNTER — Ambulatory Visit
Admission: RE | Admit: 2024-09-08 | Discharge: 2024-09-08 | Disposition: A | Discharge status: Home / Self Care | Source: Ambulatory Visit | Attending: Radiation Oncology | Admitting: Radiation Oncology

## 2024-09-08 ENCOUNTER — Ambulatory Visit (HOSPITAL_COMMUNITY)
Admission: RE | Admit: 2024-09-08 | Discharge: 2024-09-08 | Disposition: A | Discharge status: Home / Self Care | Source: Ambulatory Visit | Attending: Urology | Admitting: Urology

## 2024-09-08 DIAGNOSIS — C61 Malignant neoplasm of prostate: Secondary | ICD-10-CM | POA: Insufficient documentation

## 2024-09-08 DIAGNOSIS — Z510 Encounter for antineoplastic radiation therapy: Secondary | ICD-10-CM | POA: Insufficient documentation

## 2024-09-09 NOTE — Progress Notes (Signed)
  Radiation Oncology         [phone] ________________________________  Name: Ruben Burton MRN: [id number]  Date: 09/08/2024  DOB: 07-30-1941  COMPLEX SIMULATION NOTE  NARRATIVE:  The patient was brought to the CT Simulation planning suite today following prostate seed implantation approximately one month ago.  Identity was confirmed.  All relevant records and images related to the planned course of therapy were reviewed.  Then, the patient was set-up supine.  CT images were obtained.  The CT images were loaded into the planning software.  Then the prostate and rectum were contoured.  Treatment planning then occurred.  The implanted iodine 125 seeds were identified by the physics staff for projection of radiation distribution  I have requested : 3D Simulation  I have requested a DVH of the following structures: Prostate and rectum.    ________________________________  Donnice FELIX Patrcia, M.D.

## 2024-09-09 NOTE — Progress Notes (Signed)
  Radiation Oncology         [phone] ________________________________  Name: DEUNTE BLEDSOE MRN: [id number]  Date: 09/08/2024  DOB: 07-09-1941  SIMULATION AND TREATMENT PLANNING NOTE    ICD-10-CM   1. Malignant neoplasm of prostate (HCC)  C61       DIAGNOSIS:   83 y.o. 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   NARRATIVE:  The patient was brought to the CT Simulation planning suite.  Identity was confirmed.  All relevant records and images related to the planned course of therapy were reviewed.  The patient freely provided informed written consent to proceed with treatment after reviewing the details related to the planned course of therapy. The consent form was witnessed and verified by the simulation staff.  Then, the patient was set-up in a stable reproducible supine position for radiation therapy.  A vacuum lock pillow device was custom fabricated to position his legs in a reproducible immobilized position.  Then, I performed a urethrogram under sterile conditions to identify the prostatic apex.  CT images were obtained.  Surface markings were placed.  The CT images were loaded into the planning software.  Then the prostate target and avoidance structures including the rectum, bladder, bowel and hips were contoured.  Treatment planning then occurred.  The radiation prescription was entered and confirmed.  A total of one complex treatment devices were fabricated. I have requested : Intensity Modulated Radiotherapy (IMRT) is medically necessary for this case for the following reason:  Rectal sparing.SABRA  PLAN:  The patient will receive 45 Gy in 25 fractions of 1.8 Gy, to supplement an up-front prostate seed implant boost of 110 Gy to achieve a total nominal dose of 155 Gy.  ________________________________  Donnice FELIX Patrcia, M.D.

## 2024-09-12 ENCOUNTER — Telehealth: Payer: Self-pay | Admitting: *Deleted

## 2024-09-12 NOTE — Telephone Encounter (Signed)
 RETURNED PATIENT'S WIFE'S PHONE CALL NICKEY), SPOKE WITH PATIENT'S WIFE- Atleigh Gruen

## 2024-09-13 ENCOUNTER — Encounter: Payer: Self-pay | Admitting: Radiation Oncology

## 2024-09-13 DIAGNOSIS — Z510 Encounter for antineoplastic radiation therapy: Secondary | ICD-10-CM | POA: Diagnosis not present

## 2024-09-13 DIAGNOSIS — Z191 Hormone sensitive malignancy status: Secondary | ICD-10-CM | POA: Diagnosis not present

## 2024-09-13 DIAGNOSIS — C61 Malignant neoplasm of prostate: Secondary | ICD-10-CM | POA: Diagnosis not present

## 2024-09-13 NOTE — Progress Notes (Signed)
  Radiation Oncology         [phone] ________________________________  Name: Ruben Burton MRN: [id number]  Date: 09/13/2024  DOB: 1941-04-29  3D Planning Note   Prostate Brachytherapy Post-Implant Dosimetry  Diagnosis: 83 y.o. 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   Narrative: On a previous date, Ruben Burton returned following prostate seed implantation for post implant planning. He underwent CT scan complex simulation to delineate the three-dimensional structures of the pelvis and demonstrate the radiation distribution.  Since that time, the seed localization, and complex isodose planning with dose volume histograms have now been completed.  Results:   Prostate Coverage - The dose of radiation delivered to the 90% or more of the prostate gland (D90) was 113.87% of the prescription dose. This exceeds our goal of greater than 90%. Rectal Sparing - The volume of rectal tissue receiving the prescription dose or higher was 0.0 cc. This falls under our threshold tolerance of 1.0 cc.  Impression: The prostate seed implant appears to show adequate target coverage and appropriate rectal sparing.  Plan:  The patient will continue to follow with urology for ongoing PSA determinations. I would anticipate a high likelihood for local tumor control with minimal risk for rectal morbidity.  ________________________________  Donnice FELIX Patrcia, M.D.

## 2024-09-16 DIAGNOSIS — C61 Malignant neoplasm of prostate: Secondary | ICD-10-CM | POA: Diagnosis present

## 2024-09-16 DIAGNOSIS — Z510 Encounter for antineoplastic radiation therapy: Secondary | ICD-10-CM | POA: Diagnosis present

## 2024-09-20 ENCOUNTER — Ambulatory Visit
Admission: RE | Admit: 2024-09-20 | Discharge: 2024-09-20 | Disposition: A | Source: Ambulatory Visit | Attending: Radiation Oncology

## 2024-09-20 ENCOUNTER — Other Ambulatory Visit: Payer: Self-pay

## 2024-09-20 DIAGNOSIS — Z510 Encounter for antineoplastic radiation therapy: Secondary | ICD-10-CM | POA: Diagnosis not present

## 2024-09-20 LAB — RAD ONC ARIA SESSION SUMMARY
Course Elapsed Days: 0
Plan Fractions Treated to Date: 1
Plan Prescribed Dose Per Fraction: 1.8 Gy
Plan Total Fractions Prescribed: 25
Plan Total Prescribed Dose: 45 Gy
Reference Point Dosage Given to Date: 1.8 Gy
Reference Point Session Dosage Given: 1.8 Gy
Session Number: 1

## 2024-09-20 NOTE — Radiation Completion Notes (Signed)
 Patient Name: Ruben Burton, Ruben Burton MRN: [id number] Date of Birth: 20-Sep-1941 Referring Physician: NORETTA FERRARA, M.D. Date of Service: 2024-09-20 Radiation Oncologist: Adina Barge, M.D. [facility] [place]                             RADIATION ONCOLOGY END OF TREATMENT NOTE     Diagnosis: C61 Malignant neoplasm of prostate Staging on 2024-03-04: Malignant neoplasm of prostate (HCC) T=cT2b, N=cN0, M=cM0 Intent: Curative     ==========DELIVERED PLANS==========  First Treatment Date: 2024-09-16 Last Treatment Date: 2024-09-20   Plan Name: Prostate_Pelv Site: Prostate Technique: IMRT Mode: Photon Dose Per Fraction: 1.8 Gy Prescribed Dose (Delivered / Prescribed): 1.8 Gy / 45 Gy Prescribed Fxs (Delivered / Prescribed): 1 / 25   Plan Name: Prostate Seed Implant Site: Prostate Technique: Radioactive Seed Implant I-125 Mode: Brachytherapy Dose Per Fraction: 110 Gy Prescribed Dose (Delivered / Prescribed): 110 Gy / 110 Gy Prescribed Fxs (Delivered / Prescribed): 1 / 1     ==========ON TREATMENT VISIT DATES==========      ==========UPCOMING VISITS========== 10/25/2024 [facility] FINAL TREATMENT [facility] 1  10/24/2024 [facility] IMRT TREATMENT [facility] 1  10/21/2024 [facility] IMRT TREATMENT [facility] 1  10/20/2024 [facility] IMRT TREATMENT [facility] 1  10/19/2024 [facility] IMRT TREATMENT [facility] 1  10/18/2024 [facility] IMRT TREATMENT [facility] 1  10/17/2024 [facility] IMRT TREATMENT [facility] 1  10/14/2024 [facility] IMRT TREATMENT [facility] 1  10/13/2024 [facility] IMRT TREATMENT [facility] 1  10/12/2024 [facility] IMRT TREATMENT [facility] 1  10/11/2024 [facility] FOLLOW UP Karis Clunes, MD  10/11/2024 [facility] IMRT TREATMENT [facility]  1  10/10/2024 [facility] IMRT TREATMENT [facility] 1  10/07/2024 [facility] IMRT TREATMENT [facility] 1  10/06/2024 [facility] IMRT TREATMENT [facility] 1  10/05/2024 [facility] IMRT TREATMENT [facility] 1  10/04/2024 [facility] IMRT TREATMENT [facility] 1  10/03/2024 [facility] IMRT TREATMENT [facility] 1  09/28/2024 [facility] IMRT TREATMENT [facility] 1  09/27/2024 [facility] IMRT TREATMENT [facility] 1  09/26/2024 [facility] IMRT TREATMENT [facility] 1  09/25/2024 [facility] IMRT TREATMENT [facility] 1  09/23/2024 [facility] WEEKLY UT CHECK [facility]  09/23/2024 [facility] IMRT TREATMENT [facility] 1  09/22/2024 [facility] IMRT TREATMENT [facility] 1  09/21/2024 [facility] IMRT TREATMENT [facility] 1  09/20/2024 [facility] NEW START [facility] 1        ==========APPENDIX - ON TREATMENT VISIT NOTES==========   See weekly On Treatment Notes in Epic for details in the Media tab (listed as Progress notes on the On Treatment Visit Dates listed above).

## 2024-09-21 ENCOUNTER — Other Ambulatory Visit: Payer: Self-pay

## 2024-09-21 ENCOUNTER — Ambulatory Visit
Admission: RE | Admit: 2024-09-21 | Discharge: 2024-09-21 | Disposition: A | Discharge status: Home / Self Care | Source: Ambulatory Visit | Attending: Radiation Oncology | Admitting: Radiation Oncology

## 2024-09-21 DIAGNOSIS — Z510 Encounter for antineoplastic radiation therapy: Secondary | ICD-10-CM | POA: Diagnosis not present

## 2024-09-21 LAB — RAD ONC ARIA SESSION SUMMARY
Course Elapsed Days: 1
Plan Fractions Treated to Date: 2
Plan Prescribed Dose Per Fraction: 1.8 Gy
Plan Total Fractions Prescribed: 25
Plan Total Prescribed Dose: 45 Gy
Reference Point Dosage Given to Date: 3.6 Gy
Reference Point Session Dosage Given: 1.8 Gy
Session Number: 2

## 2024-09-21 NOTE — Radiation Completion Notes (Signed)
 Patient Name: Ruben Burton, Ruben Burton MRN: [id number] Date of Birth: [birth year] Referring Physician: NORETTA FERRARA, M.D. Date of Service: 2024-09-21 Radiation Oncologist: Adina Barge, M.D. [facility] [place]                             RADIATION ONCOLOGY END OF TREATMENT NOTE     Diagnosis: C61 Malignant neoplasm of prostate Staging on 2024-03-04: Malignant neoplasm of prostate (HCC) T=cT2b, N=cN0, M=cM0 Intent: Curative     ==========DELIVERED PLANS==========  First Treatment Date: 2024-09-16 Last Treatment Date: 2024-09-21   Plan Name: Prostate_Pelv Site: Prostate Technique: IMRT Mode: Photon Dose Per Fraction: 1.8 Gy Prescribed Dose (Delivered / Prescribed): 3.6 Gy / 45 Gy Prescribed Fxs (Delivered / Prescribed): 2 / 25   Plan Name: Prostate Seed Implant Site: Prostate Technique: Radioactive Seed Implant I-125 Mode: Brachytherapy Dose Per Fraction: 110 Gy Prescribed Dose (Delivered / Prescribed): 110 Gy / 110 Gy Prescribed Fxs (Delivered / Prescribed): 1 / 1     ==========ON TREATMENT VISIT DATES==========      ==========UPCOMING VISITS========== 10/25/2024 [facility] FINAL TREATMENT [facility] 1  10/24/2024 [facility] IMRT TREATMENT [facility] 1  10/21/2024 [facility] IMRT TREATMENT [facility] 1  10/20/2024 [facility] IMRT TREATMENT [facility] 1  10/19/2024 [facility] IMRT TREATMENT [facility] 1  10/18/2024 [facility] IMRT TREATMENT [facility] 1  10/17/2024 [facility] IMRT TREATMENT [facility] 1  10/14/2024 [facility] IMRT TREATMENT [facility] 1  10/13/2024 [facility] IMRT TREATMENT [facility] 1  10/12/2024 [facility] IMRT TREATMENT [facility] 1  10/11/2024 [facility] Karis Clunes, MD  10/11/2024 [facility] IMRT TREATMENT [facility]  1  10/10/2024 [facility] IMRT TREATMENT [facility] 1  10/07/2024 [facility] IMRT TREATMENT [facility] 1  10/06/2024 [facility] IMRT TREATMENT [facility] 1  10/05/2024 [facility] IMRT TREATMENT [facility] 1  10/04/2024 [facility] IMRT TREATMENT [facility] 1  10/03/2024 [facility] IMRT TREATMENT [facility] 1  09/28/2024 [facility] WEEKLY UT CHECK [facility]  09/28/2024 [facility] IMRT TREATMENT [facility] 1  09/27/2024 [facility] IMRT TREATMENT [facility] 1  09/26/2024 [facility] IMRT TREATMENT [facility] 1  09/25/2024 [facility] IMRT TREATMENT [facility] 1  09/23/2024 [facility] ON[facility] WEEKLY UT CHECK [facility]-MANNING  09/23/2024 [facility] IMRT TREATMENT [facility] 1  09/22/2024 [facility] IMRT TREATMENT [facility] 1  09/21/2024 [facility] IMRT TREATMENT [facility] 1        ==========APPENDIX - ON TREATMENT VISIT NOTES==========   See weekly On Treatment Notes in Epic for details in the Media tab (listed as Progress notes on the On Treatment Visit Dates listed above).

## 2024-09-22 ENCOUNTER — Ambulatory Visit
Admission: RE | Admit: 2024-09-22 | Discharge: 2024-09-22 | Disposition: A | Source: Ambulatory Visit | Attending: Radiation Oncology

## 2024-09-22 ENCOUNTER — Other Ambulatory Visit: Payer: Self-pay

## 2024-09-22 DIAGNOSIS — Z510 Encounter for antineoplastic radiation therapy: Secondary | ICD-10-CM | POA: Diagnosis not present

## 2024-09-22 LAB — RAD ONC ARIA SESSION SUMMARY
Course Elapsed Days: 2
Plan Fractions Treated to Date: 3
Plan Prescribed Dose Per Fraction: 1.8 Gy
Plan Total Fractions Prescribed: 25
Plan Total Prescribed Dose: 45 Gy
Reference Point Dosage Given to Date: 5.4 Gy
Reference Point Session Dosage Given: 1.8 Gy
Session Number: 3

## 2024-09-22 NOTE — Radiation Completion Notes (Signed)
 Patient Name: Ruben Burton, Ruben Burton MRN: [id number] Date of Birth: 07/23/1941 Referring Physician: NORETTA FERRARA, M.D. Date of Service: 2024-09-22 Radiation Oncologist: Adina Barge, M.D. [facility] [place]                             RADIATION ONCOLOGY END OF TREATMENT NOTE     Diagnosis: C61 Malignant neoplasm of prostate Staging on 2024-03-04: Malignant neoplasm of prostate (HCC) T=cT2b, N=cN0, M=cM0 Intent: Curative     ==========DELIVERED PLANS==========  First Treatment Date: 2024-09-16 Last Treatment Date: 2024-09-22   Plan Name: Prostate_Pelv Site: Prostate Technique: IMRT Mode: Photon Dose Per Fraction: 1.8 Gy Prescribed Dose (Delivered / Prescribed): 5.4 Gy / 45 Gy Prescribed Fxs (Delivered / Prescribed): 3 / 25   Plan Name: Prostate Seed Implant Site: Prostate Technique: Radioactive Seed Implant I-125 Mode: Brachytherapy Dose Per Fraction: 110 Gy Prescribed Dose (Delivered / Prescribed): 110 Gy / 110 Gy Prescribed Fxs (Delivered / Prescribed): 1 / 1     ==========ON TREATMENT VISIT DATES==========      ==========UPCOMING VISITS========== 10/25/2024 [facility] FINAL TREATMENT [facility] 1  10/24/2024 [facility] IMRT TREATMENT [facility] 1  10/21/2024 [facility] IMRT TREATMENT [facility] 1  10/20/2024 [facility] IMRT TREATMENT [facility] 1  10/19/2024 [facility] IMRT TREATMENT [facility] 1  10/18/2024 [facility] IMRT TREATMENT [facility] 1  10/17/2024 [facility] IMRT TREATMENT [facility] 1  10/14/2024 [facility] IMRT TREATMENT [facility] 1  10/13/2024 [facility] IMRT TREATMENT [facility] 1  10/12/2024 [facility] IMRT TREATMENT [facility] 1  10/11/2024 [facility] Karis Clunes, MD  10/11/2024 [facility] IMRT TREATMENT [facility]  1  10/10/2024 [facility] IMRT TREATMENT [facility] 1  10/07/2024 [facility] IMRT TREATMENT [facility] 1  10/06/2024 [facility] IMRT TREATMENT [facility] 1  10/05/2024 [facility] IMRT TREATMENT [facility] 1  10/04/2024 [facility] IMRT TREATMENT [facility] 1  10/03/2024 [facility] ON[facility] IMRT TREATMENT [facility] 1  09/28/2024 [facility] ON[facility] WEEKLY UT CHECK [facility]  09/28/2024 [facility] IMRT TREATMENT [facility] 1  09/27/2024 [facility] IMRT TREATMENT [facility] 1  09/26/2024 [facility] IMRT TREATMENT [facility] 1  09/25/2024 [facility] IMRT TREATMENT [facility] 1  09/23/2024 [facility] ONC WEEKLY UT CHECK [facility]-MANNING  09/23/2024 [facility] ON[facility] IMRT TREATMENT [facility] 1  09/22/2024 [facility] IMRT TREATMENT [facility] 1        ==========APPENDIX - ON TREATMENT VISIT NOTES==========   See weekly On Treatment Notes in Epic for details in the Media tab (listed as Progress notes on the On Treatment Visit Dates listed above).

## 2024-09-23 ENCOUNTER — Ambulatory Visit

## 2024-09-23 ENCOUNTER — Other Ambulatory Visit: Payer: Self-pay

## 2024-09-23 ENCOUNTER — Ambulatory Visit
Admission: RE | Admit: 2024-09-23 | Discharge: 2024-09-23 | Disposition: A | Discharge status: Home / Self Care | Source: Ambulatory Visit | Attending: Radiation Oncology | Admitting: Radiation Oncology

## 2024-09-23 DIAGNOSIS — Z510 Encounter for antineoplastic radiation therapy: Secondary | ICD-10-CM | POA: Diagnosis not present

## 2024-09-23 LAB — RAD ONC ARIA SESSION SUMMARY
Course Elapsed Days: 3
Plan Fractions Treated to Date: 4
Plan Prescribed Dose Per Fraction: 1.8 Gy
Plan Total Fractions Prescribed: 25
Plan Total Prescribed Dose: 45 Gy
Reference Point Dosage Given to Date: 7.2 Gy
Reference Point Session Dosage Given: 1.8 Gy
Session Number: 4

## 2024-09-25 ENCOUNTER — Ambulatory Visit
Admission: RE | Admit: 2024-09-25 | Discharge: 2024-09-25 | Disposition: A | Discharge status: Home / Self Care | Source: Ambulatory Visit | Attending: Radiation Oncology | Admitting: Radiation Oncology

## 2024-09-25 ENCOUNTER — Other Ambulatory Visit: Payer: Self-pay

## 2024-09-25 DIAGNOSIS — Z510 Encounter for antineoplastic radiation therapy: Secondary | ICD-10-CM | POA: Diagnosis not present

## 2024-09-25 LAB — RAD ONC ARIA SESSION SUMMARY
Course Elapsed Days: 5
Plan Fractions Treated to Date: 5
Plan Prescribed Dose Per Fraction: 1.8 Gy
Plan Total Fractions Prescribed: 25
Plan Total Prescribed Dose: 45 Gy
Reference Point Dosage Given to Date: 9 Gy
Reference Point Session Dosage Given: 1.8 Gy
Session Number: 5

## 2024-09-26 ENCOUNTER — Ambulatory Visit
Admission: RE | Admit: 2024-09-26 | Discharge: 2024-09-26 | Disposition: A | Source: Ambulatory Visit | Attending: Radiation Oncology

## 2024-09-26 ENCOUNTER — Other Ambulatory Visit: Payer: Self-pay

## 2024-09-26 DIAGNOSIS — Z510 Encounter for antineoplastic radiation therapy: Secondary | ICD-10-CM | POA: Diagnosis not present

## 2024-09-26 LAB — RAD ONC ARIA SESSION SUMMARY
Course Elapsed Days: 6
Plan Fractions Treated to Date: 6
Plan Prescribed Dose Per Fraction: 1.8 Gy
Plan Total Fractions Prescribed: 25
Plan Total Prescribed Dose: 45 Gy
Reference Point Dosage Given to Date: 10.8 Gy
Reference Point Session Dosage Given: 1.8 Gy
Session Number: 6

## 2024-09-27 ENCOUNTER — Ambulatory Visit
Admission: RE | Admit: 2024-09-27 | Discharge: 2024-09-27 | Disposition: A | Discharge status: Home / Self Care | Source: Ambulatory Visit | Attending: Radiation Oncology | Admitting: Radiation Oncology

## 2024-09-27 ENCOUNTER — Other Ambulatory Visit: Payer: Self-pay

## 2024-09-27 DIAGNOSIS — Z510 Encounter for antineoplastic radiation therapy: Secondary | ICD-10-CM | POA: Diagnosis not present

## 2024-09-27 LAB — RAD ONC ARIA SESSION SUMMARY
Course Elapsed Days: 7
Plan Fractions Treated to Date: 7
Plan Prescribed Dose Per Fraction: 1.8 Gy
Plan Total Fractions Prescribed: 25
Plan Total Prescribed Dose: 45 Gy
Reference Point Dosage Given to Date: 12.6 Gy
Reference Point Session Dosage Given: 1.8 Gy
Session Number: 7

## 2024-09-28 ENCOUNTER — Other Ambulatory Visit: Payer: Self-pay

## 2024-09-28 ENCOUNTER — Ambulatory Visit
Admission: RE | Admit: 2024-09-28 | Discharge: 2024-09-28 | Disposition: A | Source: Ambulatory Visit | Attending: Radiation Oncology

## 2024-09-28 DIAGNOSIS — Z510 Encounter for antineoplastic radiation therapy: Secondary | ICD-10-CM | POA: Diagnosis not present

## 2024-09-28 LAB — RAD ONC ARIA SESSION SUMMARY
Course Elapsed Days: 8
Plan Fractions Treated to Date: 8
Plan Prescribed Dose Per Fraction: 1.8 Gy
Plan Total Fractions Prescribed: 25
Plan Total Prescribed Dose: 45 Gy
Reference Point Dosage Given to Date: 14.4 Gy
Reference Point Session Dosage Given: 1.8 Gy
Session Number: 8

## 2024-10-03 ENCOUNTER — Ambulatory Visit
Admission: RE | Admit: 2024-10-03 | Discharge: 2024-10-03 | Disposition: A | Discharge status: Home / Self Care | Source: Ambulatory Visit | Attending: Radiation Oncology | Admitting: Radiation Oncology

## 2024-10-03 ENCOUNTER — Other Ambulatory Visit: Payer: Self-pay

## 2024-10-03 DIAGNOSIS — Z510 Encounter for antineoplastic radiation therapy: Secondary | ICD-10-CM | POA: Insufficient documentation

## 2024-10-03 DIAGNOSIS — C61 Malignant neoplasm of prostate: Secondary | ICD-10-CM | POA: Insufficient documentation

## 2024-10-03 LAB — RAD ONC ARIA SESSION SUMMARY
Course Elapsed Days: 13
Plan Fractions Treated to Date: 9
Plan Prescribed Dose Per Fraction: 1.8 Gy
Plan Total Fractions Prescribed: 25
Plan Total Prescribed Dose: 45 Gy
Reference Point Dosage Given to Date: 16.2 Gy
Reference Point Session Dosage Given: 1.8 Gy
Session Number: 9

## 2024-10-04 ENCOUNTER — Ambulatory Visit
Admission: RE | Admit: 2024-10-04 | Discharge: 2024-10-04 | Disposition: A | Source: Ambulatory Visit | Attending: Radiation Oncology

## 2024-10-04 ENCOUNTER — Other Ambulatory Visit: Payer: Self-pay

## 2024-10-04 DIAGNOSIS — Z510 Encounter for antineoplastic radiation therapy: Secondary | ICD-10-CM | POA: Diagnosis not present

## 2024-10-04 LAB — RAD ONC ARIA SESSION SUMMARY
Course Elapsed Days: 14
Plan Fractions Treated to Date: 10
Plan Prescribed Dose Per Fraction: 1.8 Gy
Plan Total Fractions Prescribed: 25
Plan Total Prescribed Dose: 45 Gy
Reference Point Dosage Given to Date: 18 Gy
Reference Point Session Dosage Given: 1.8 Gy
Session Number: 10

## 2024-10-05 ENCOUNTER — Other Ambulatory Visit: Payer: Self-pay

## 2024-10-05 ENCOUNTER — Ambulatory Visit
Admission: RE | Admit: 2024-10-05 | Discharge: 2024-10-05 | Disposition: A | Source: Ambulatory Visit | Attending: Radiation Oncology

## 2024-10-05 DIAGNOSIS — Z510 Encounter for antineoplastic radiation therapy: Secondary | ICD-10-CM | POA: Diagnosis not present

## 2024-10-05 LAB — RAD ONC ARIA SESSION SUMMARY
Course Elapsed Days: 15
Plan Fractions Treated to Date: 11
Plan Prescribed Dose Per Fraction: 1.8 Gy
Plan Total Fractions Prescribed: 25
Plan Total Prescribed Dose: 45 Gy
Reference Point Dosage Given to Date: 19.8 Gy
Reference Point Session Dosage Given: 1.8 Gy
Session Number: 11

## 2024-10-06 ENCOUNTER — Other Ambulatory Visit: Payer: Self-pay

## 2024-10-06 ENCOUNTER — Ambulatory Visit
Admission: RE | Admit: 2024-10-06 | Discharge: 2024-10-06 | Disposition: A | Source: Ambulatory Visit | Attending: Radiation Oncology

## 2024-10-06 DIAGNOSIS — Z510 Encounter for antineoplastic radiation therapy: Secondary | ICD-10-CM | POA: Diagnosis not present

## 2024-10-06 LAB — RAD ONC ARIA SESSION SUMMARY
Course Elapsed Days: 16
Plan Fractions Treated to Date: 12
Plan Prescribed Dose Per Fraction: 1.8 Gy
Plan Total Fractions Prescribed: 25
Plan Total Prescribed Dose: 45 Gy
Reference Point Dosage Given to Date: 21.6 Gy
Reference Point Session Dosage Given: 1.8 Gy
Session Number: 12

## 2024-10-07 ENCOUNTER — Other Ambulatory Visit: Payer: Self-pay

## 2024-10-07 ENCOUNTER — Ambulatory Visit
Admission: RE | Admit: 2024-10-07 | Discharge: 2024-10-07 | Disposition: A | Source: Ambulatory Visit | Attending: Radiation Oncology

## 2024-10-07 DIAGNOSIS — Z510 Encounter for antineoplastic radiation therapy: Secondary | ICD-10-CM | POA: Diagnosis not present

## 2024-10-07 LAB — RAD ONC ARIA SESSION SUMMARY
Course Elapsed Days: 17
Plan Fractions Treated to Date: 13
Plan Prescribed Dose Per Fraction: 1.8 Gy
Plan Total Fractions Prescribed: 25
Plan Total Prescribed Dose: 45 Gy
Reference Point Dosage Given to Date: 23.4 Gy
Reference Point Session Dosage Given: 1.8 Gy
Session Number: 13

## 2024-10-10 ENCOUNTER — Ambulatory Visit: Admission: RE | Admit: 2024-10-10 | Discharge: 2024-10-10 | Attending: Radiation Oncology

## 2024-10-10 ENCOUNTER — Other Ambulatory Visit: Payer: Self-pay

## 2024-10-10 DIAGNOSIS — Z510 Encounter for antineoplastic radiation therapy: Secondary | ICD-10-CM | POA: Diagnosis not present

## 2024-10-10 LAB — RAD ONC ARIA SESSION SUMMARY
Course Elapsed Days: 20
Plan Fractions Treated to Date: 14
Plan Prescribed Dose Per Fraction: 1.8 Gy
Plan Total Fractions Prescribed: 25
Plan Total Prescribed Dose: 45 Gy
Reference Point Dosage Given to Date: 25.2 Gy
Reference Point Session Dosage Given: 1.8 Gy
Session Number: 14

## 2024-10-11 ENCOUNTER — Ambulatory Visit (INDEPENDENT_AMBULATORY_CARE_PROVIDER_SITE_OTHER): Admitting: Otolaryngology

## 2024-10-11 ENCOUNTER — Ambulatory Visit
Admission: RE | Admit: 2024-10-11 | Discharge: 2024-10-11 | Disposition: A | Source: Ambulatory Visit | Attending: Radiation Oncology

## 2024-10-11 ENCOUNTER — Encounter (INDEPENDENT_AMBULATORY_CARE_PROVIDER_SITE_OTHER): Payer: Self-pay | Admitting: Otolaryngology

## 2024-10-11 ENCOUNTER — Other Ambulatory Visit: Payer: Self-pay

## 2024-10-11 VITALS — BP 129/81 | HR 75 | Temp 98.0°F | Ht 72.0 in | Wt 190.0 lb

## 2024-10-11 DIAGNOSIS — H608X3 Other otitis externa, bilateral: Secondary | ICD-10-CM | POA: Diagnosis not present

## 2024-10-11 DIAGNOSIS — H6123 Impacted cerumen, bilateral: Secondary | ICD-10-CM | POA: Diagnosis not present

## 2024-10-11 DIAGNOSIS — H903 Sensorineural hearing loss, bilateral: Secondary | ICD-10-CM

## 2024-10-11 DIAGNOSIS — R42 Dizziness and giddiness: Secondary | ICD-10-CM

## 2024-10-11 DIAGNOSIS — Z510 Encounter for antineoplastic radiation therapy: Secondary | ICD-10-CM | POA: Diagnosis not present

## 2024-10-11 LAB — RAD ONC ARIA SESSION SUMMARY
Course Elapsed Days: 21
Plan Fractions Treated to Date: 15
Plan Prescribed Dose Per Fraction: 1.8 Gy
Plan Total Fractions Prescribed: 25
Plan Total Prescribed Dose: 45 Gy
Reference Point Dosage Given to Date: 27 Gy
Reference Point Session Dosage Given: 1.8 Gy
Session Number: 15

## 2024-10-11 NOTE — Progress Notes (Signed)
 Patient ID: Ruben Burton, male   DOB: [birth year], 83 y.o.   MRN: [id number]  Follow up: Asymmetric hearing loss, eczematous otitis externa, recurrent dizziness  History of Present Illness Ruben Burton is an 83 year old male who returns today for his follow-up evaluation.  The patient was last seen in June 2025.  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  He also has a history of bilateral eczematous otitis externa and recurrent dizziness.  His MRI scan was negative for retrocochlear lesion.  His eczematous otitis externa was treated with Elocon cream.   The patient returns today reporting no significant change in his hearing loss, more pronounced on the left side. He has not used hearing aids previously.  He continues to experience itchy ears and has been using Elocon cream for relief. He prefers ear drops for ease of use.  He experiences dizziness described as lightheadedness when changing positions, such as getting up or lying down. No spinning sensation is associated with the dizziness. He continues to perform balance exercises.  Exam: General: Communicates without difficulty, well nourished, no acute distress. Head: Normocephalic, no evidence injury, no tenderness, facial buttresses intact without stepoff. Face/sinus: No tenderness to palpation and percussion. Facial movement is normal and symmetric. Eyes: PERRL, EOMI. No scleral icterus, conjunctivae clear. Neuro: CN II exam reveals vision grossly intact.  No nystagmus at any point of gaze. Ears: Auricles well formed without lesions.  Bilateral cerumen impaction.  Nose: External evaluation reveals normal support and skin without lesions.  Dorsum is intact.  Anterior rhinoscopy reveals congested mucosa over anterior aspect of inferior turbinates and intact septum.  No purulence noted. Oral:  Oral cavity and oropharynx are intact, symmetric, without erythema or edema.  Mucosa is moist  without lesions. Neck: Full range of motion without pain.  There is no significant lymphadenopathy.  No masses palpable.  Thyroid  bed within normal limits to palpation.  Parotid glands and submandibular glands equal bilaterally without mass.  Trachea is midline. Neuro:  CN 2-12 grossly intact. Vestibular: No nystagmus at any point of gaze. Dix Hallpike negative.  Vestibular: There is no nystagmus with pneumatic pressure on either tympanic membrane or Valsalva. The cerebellar examination is unremarkable.     Procedure: Bilateral cerumen disimpaction Anesthesia: None Description: Under the operating microscope, the cerumen is carefully removed with a combination of cerumen currette, alligator forceps, and suction catheters.  After the cerumen is removed, the TMs are noted to be normal.  Eczematous changes are noted within the ear canals.  No mass, erythema, or lesions. The patient tolerated the procedure well. Assessment & Plan Bilateral cerumen impaction Significant cerumen accumulation in both ears, previously requiring cleaning. No current hearing aid use. - Otomicroscopy with bilateral cerumen disimpaction.  Bilateral chronic otitis externa. Persistent itching in ears. Currently using Elocon cream. Prefers ear drops for convenience.  - Prescribed prednisolone ear drops for itching. - Continue using Elocon cream as needed.  Recurrent dizziness of unknown etiology Dizziness occurs when lying down and getting up, described as lightheadedness without spinning. Continues balance exercises from previous physical therapy. - Continue balance exercises.

## 2024-10-12 ENCOUNTER — Ambulatory Visit: Admission: RE | Admit: 2024-10-12 | Discharge: 2024-10-12 | Attending: Radiation Oncology

## 2024-10-12 ENCOUNTER — Other Ambulatory Visit: Payer: Self-pay

## 2024-10-12 DIAGNOSIS — Z510 Encounter for antineoplastic radiation therapy: Secondary | ICD-10-CM | POA: Diagnosis not present

## 2024-10-12 LAB — RAD ONC ARIA SESSION SUMMARY
Course Elapsed Days: 22
Plan Fractions Treated to Date: 16
Plan Prescribed Dose Per Fraction: 1.8 Gy
Plan Total Fractions Prescribed: 25
Plan Total Prescribed Dose: 45 Gy
Reference Point Dosage Given to Date: 28.8 Gy
Reference Point Session Dosage Given: 1.8 Gy
Session Number: 16

## 2024-10-13 ENCOUNTER — Ambulatory Visit
Admission: RE | Admit: 2024-10-13 | Discharge: 2024-10-13 | Disposition: A | Discharge status: Home / Self Care | Source: Ambulatory Visit | Attending: Radiation Oncology | Admitting: Radiation Oncology

## 2024-10-13 ENCOUNTER — Other Ambulatory Visit: Payer: Self-pay

## 2024-10-13 ENCOUNTER — Ambulatory Visit: Admission: RE | Admit: 2024-10-13 | Discharge: 2024-10-13 | Attending: Radiation Oncology

## 2024-10-13 DIAGNOSIS — Z510 Encounter for antineoplastic radiation therapy: Secondary | ICD-10-CM | POA: Diagnosis not present

## 2024-10-13 LAB — RAD ONC ARIA SESSION SUMMARY
Course Elapsed Days: 23
Plan Fractions Treated to Date: 17
Plan Prescribed Dose Per Fraction: 1.8 Gy
Plan Total Fractions Prescribed: 25
Plan Total Prescribed Dose: 45 Gy
Reference Point Dosage Given to Date: 30.6 Gy
Reference Point Session Dosage Given: 1.8 Gy
Session Number: 17

## 2024-10-14 ENCOUNTER — Ambulatory Visit
Admission: RE | Admit: 2024-10-14 | Discharge: 2024-10-14 | Disposition: A | Source: Ambulatory Visit | Attending: Radiation Oncology

## 2024-10-14 ENCOUNTER — Other Ambulatory Visit: Payer: Self-pay

## 2024-10-14 DIAGNOSIS — Z510 Encounter for antineoplastic radiation therapy: Secondary | ICD-10-CM | POA: Diagnosis not present

## 2024-10-14 LAB — RAD ONC ARIA SESSION SUMMARY
Course Elapsed Days: 24
Plan Fractions Treated to Date: 18
Plan Prescribed Dose Per Fraction: 1.8 Gy
Plan Total Fractions Prescribed: 25
Plan Total Prescribed Dose: 45 Gy
Reference Point Dosage Given to Date: 32.4 Gy
Reference Point Session Dosage Given: 1.8 Gy
Session Number: 18

## 2024-10-17 ENCOUNTER — Ambulatory Visit (INDEPENDENT_AMBULATORY_CARE_PROVIDER_SITE_OTHER): Admitting: Otolaryngology

## 2024-10-17 ENCOUNTER — Ambulatory Visit: Admission: RE | Admit: 2024-10-17 | Discharge: 2024-10-17 | Attending: Radiation Oncology

## 2024-10-17 ENCOUNTER — Other Ambulatory Visit: Payer: Self-pay

## 2024-10-17 DIAGNOSIS — Z510 Encounter for antineoplastic radiation therapy: Secondary | ICD-10-CM | POA: Diagnosis not present

## 2024-10-17 LAB — RAD ONC ARIA SESSION SUMMARY
Course Elapsed Days: 27
Plan Fractions Treated to Date: 19
Plan Prescribed Dose Per Fraction: 1.8 Gy
Plan Total Fractions Prescribed: 25
Plan Total Prescribed Dose: 45 Gy
Reference Point Dosage Given to Date: 34.2 Gy
Reference Point Session Dosage Given: 1.8 Gy
Session Number: 19

## 2024-10-18 ENCOUNTER — Other Ambulatory Visit: Payer: Self-pay

## 2024-10-18 ENCOUNTER — Ambulatory Visit: Admission: RE | Admit: 2024-10-18 | Discharge: 2024-10-18 | Attending: Radiation Oncology

## 2024-10-18 DIAGNOSIS — Z510 Encounter for antineoplastic radiation therapy: Secondary | ICD-10-CM | POA: Diagnosis not present

## 2024-10-18 LAB — RAD ONC ARIA SESSION SUMMARY
Course Elapsed Days: 28
Plan Fractions Treated to Date: 20
Plan Prescribed Dose Per Fraction: 1.8 Gy
Plan Total Fractions Prescribed: 25
Plan Total Prescribed Dose: 45 Gy
Reference Point Dosage Given to Date: 36 Gy
Reference Point Session Dosage Given: 1.8 Gy
Session Number: 20

## 2024-10-19 ENCOUNTER — Ambulatory Visit: Admission: RE | Admit: 2024-10-19 | Discharge: 2024-10-19 | Attending: Radiation Oncology

## 2024-10-19 ENCOUNTER — Other Ambulatory Visit: Payer: Self-pay

## 2024-10-19 DIAGNOSIS — Z510 Encounter for antineoplastic radiation therapy: Secondary | ICD-10-CM | POA: Diagnosis not present

## 2024-10-19 LAB — RAD ONC ARIA SESSION SUMMARY
Course Elapsed Days: 29
Plan Fractions Treated to Date: 21
Plan Prescribed Dose Per Fraction: 1.8 Gy
Plan Total Fractions Prescribed: 25
Plan Total Prescribed Dose: 45 Gy
Reference Point Dosage Given to Date: 37.8 Gy
Reference Point Session Dosage Given: 1.8 Gy
Session Number: 21

## 2024-10-20 ENCOUNTER — Other Ambulatory Visit: Payer: Self-pay

## 2024-10-20 ENCOUNTER — Ambulatory Visit: Admission: RE | Admit: 2024-10-20

## 2024-10-20 DIAGNOSIS — Z510 Encounter for antineoplastic radiation therapy: Secondary | ICD-10-CM | POA: Diagnosis not present

## 2024-10-20 LAB — RAD ONC ARIA SESSION SUMMARY
Course Elapsed Days: 30
Plan Fractions Treated to Date: 22
Plan Prescribed Dose Per Fraction: 1.8 Gy
Plan Total Fractions Prescribed: 25
Plan Total Prescribed Dose: 45 Gy
Reference Point Dosage Given to Date: 39.6 Gy
Reference Point Session Dosage Given: 1.8 Gy
Session Number: 22

## 2024-10-21 ENCOUNTER — Ambulatory Visit: Admission: RE | Admit: 2024-10-21 | Discharge: 2024-10-21 | Attending: Radiation Oncology

## 2024-10-21 ENCOUNTER — Other Ambulatory Visit: Payer: Self-pay

## 2024-10-21 DIAGNOSIS — Z510 Encounter for antineoplastic radiation therapy: Secondary | ICD-10-CM | POA: Diagnosis not present

## 2024-10-21 LAB — RAD ONC ARIA SESSION SUMMARY
Course Elapsed Days: 31
Plan Fractions Treated to Date: 23
Plan Prescribed Dose Per Fraction: 1.8 Gy
Plan Total Fractions Prescribed: 25
Plan Total Prescribed Dose: 45 Gy
Reference Point Dosage Given to Date: 41.4 Gy
Reference Point Session Dosage Given: 1.8 Gy
Session Number: 23

## 2024-10-24 ENCOUNTER — Ambulatory Visit: Admission: RE | Admit: 2024-10-24 | Discharge: 2024-10-24 | Attending: Radiation Oncology

## 2024-10-24 ENCOUNTER — Other Ambulatory Visit: Payer: Self-pay

## 2024-10-24 DIAGNOSIS — Z510 Encounter for antineoplastic radiation therapy: Secondary | ICD-10-CM | POA: Diagnosis not present

## 2024-10-24 LAB — RAD ONC ARIA SESSION SUMMARY
Course Elapsed Days: 34
Plan Fractions Treated to Date: 24
Plan Prescribed Dose Per Fraction: 1.8 Gy
Plan Total Fractions Prescribed: 25
Plan Total Prescribed Dose: 45 Gy
Reference Point Dosage Given to Date: 43.2 Gy
Reference Point Session Dosage Given: 1.8 Gy
Session Number: 24

## 2024-10-25 ENCOUNTER — Other Ambulatory Visit: Payer: Self-pay | Admitting: Radiology

## 2024-10-25 ENCOUNTER — Other Ambulatory Visit: Payer: Self-pay

## 2024-10-25 ENCOUNTER — Telehealth: Payer: Self-pay | Admitting: Radiation Oncology

## 2024-10-25 ENCOUNTER — Ambulatory Visit
Admission: RE | Admit: 2024-10-25 | Discharge: 2024-10-25 | Disposition: A | Discharge status: Home / Self Care | Source: Ambulatory Visit | Attending: Radiation Oncology | Admitting: Radiation Oncology

## 2024-10-25 DIAGNOSIS — C61 Malignant neoplasm of prostate: Secondary | ICD-10-CM

## 2024-10-25 DIAGNOSIS — Z510 Encounter for antineoplastic radiation therapy: Secondary | ICD-10-CM | POA: Diagnosis not present

## 2024-10-25 LAB — RAD ONC ARIA SESSION SUMMARY
Course Elapsed Days: 35
Plan Fractions Treated to Date: 25
Plan Prescribed Dose Per Fraction: 1.8 Gy
Plan Total Fractions Prescribed: 25
Plan Total Prescribed Dose: 45 Gy
Reference Point Dosage Given to Date: 45 Gy
Reference Point Session Dosage Given: 1.8 Gy
Session Number: 25

## 2024-10-25 MED ORDER — GERHARDT'S BUTT CREAM: 1.0000 | TOPICAL_CREAM | Freq: Two times a day (BID) | CUTANEOUS | 0 refills | Status: DC

## 2024-10-25 MED ORDER — ZINC OXIDE 13 % EX CREA: TOPICAL_OINTMENT | Freq: Two times a day (BID) | CUTANEOUS | 0 refills | Status: AC

## 2024-10-25 NOTE — Telephone Encounter (Signed)
 Received call from pt's wife requesting advice for raw area close to treatment area. Call transferred to nursing team for assistance.

## 2024-10-25 NOTE — Addendum Note (Signed)
 Addended by: WYATT CZAR on: 10/25/2024 03:19 PM   Modules accepted: Orders

## 2024-10-26 ENCOUNTER — Ambulatory Visit

## 2024-10-26 ENCOUNTER — Telehealth: Payer: Self-pay

## 2024-10-26 NOTE — Telephone Encounter (Signed)
 Called placed to [facility] to order  mupirocin 2% oint-hydrocortisone 2.5% cream-nystatin cream-zinc  oxide 13% oint 1:1:1:5 mixture [[id number]].  Spoke with Penne ( Compound pharmacist.)   Prescription was originally sent to [facility] could not compound medication.   Called and made wife aware.

## 2024-10-26 NOTE — Radiation Completion Notes (Addendum)
" °  Radiation Oncology         [phone] ________________________________  Name: Ruben Burton MRN: [id number]  Date: 10/25/2024  DOB: 10-23-1941  Referring Physician: NORETTA FERRARA, M.D. Date of Service: 2024-10-26 Radiation Oncologist: Adina Barge, M.D. [facility]     RADIATION ONCOLOGY END OF TREATMENT NOTE     Diagnosis: 83 y.o. 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   Intent: Curative     ==========DELIVERED PLANS==========  First Treatment Date: 2024-09-20 Last Treatment Date: 2024-10-25   Plan Name: Prostate_Pelv Site: Prostate Technique: IMRT Mode: Photon Dose Per Fraction: 1.8 Gy Prescribed Dose (Delivered / Prescribed): 45 Gy / 45 Gy Prescribed Fxs (Delivered / Prescribed): 25 / 25   Plan Name: Prostate Seed Implant Site: Prostate Technique: Radioactive Seed Implant I-125 Mode: Brachytherapy Dose Per Fraction: 110 Gy Prescribed Dose (Delivered / Prescribed): 110 Gy / 110 Gy Prescribed Fxs (Delivered / Prescribed): 1 / 1     ==========ON TREATMENT VISIT DATES========== 2024-09-25, 2024-09-28, 2024-10-07, 2024-10-13, 2024-10-21   See weekly On Treatment Notes in Epic for details in the Media tab (listed as Progress notes on the On Treatment Visit Dates listed above).  He tolerated the radiation treatment relatively well with mild increased LUTS and modest fatigue.  The patient will receive a call in about one month from the radiation oncology department. He will continue follow up with Dr. Ferrara as well.  ------------------------------------------------   Donnice Barge, MD [facility]  Radiation Oncology Direct Dial: [phone]  Fax: [phone] [email or web address]  Skype  LinkedIn   "

## 2024-11-07 ENCOUNTER — Telehealth: Payer: Self-pay | Admitting: Cardiovascular Disease

## 2024-11-07 ENCOUNTER — Ambulatory Visit: Attending: Cardiology | Admitting: Cardiology

## 2024-11-07 ENCOUNTER — Telehealth: Payer: Self-pay

## 2024-11-07 ENCOUNTER — Ambulatory Visit: Attending: Cardiology

## 2024-11-07 VITALS — BP 120/60 | HR 72 | Ht 73.0 in | Wt 214.0 lb

## 2024-11-07 DIAGNOSIS — Z79899 Other long term (current) drug therapy: Secondary | ICD-10-CM | POA: Diagnosis not present

## 2024-11-07 DIAGNOSIS — I493 Ventricular premature depolarization: Secondary | ICD-10-CM | POA: Diagnosis not present

## 2024-11-07 DIAGNOSIS — R600 Localized edema: Secondary | ICD-10-CM | POA: Diagnosis not present

## 2024-11-07 DIAGNOSIS — I2510 Atherosclerotic heart disease of native coronary artery without angina pectoris: Secondary | ICD-10-CM | POA: Diagnosis not present

## 2024-11-07 DIAGNOSIS — I10 Essential (primary) hypertension: Secondary | ICD-10-CM | POA: Diagnosis not present

## 2024-11-07 DIAGNOSIS — E785 Hyperlipidemia, unspecified: Secondary | ICD-10-CM | POA: Diagnosis not present

## 2024-11-07 MED ORDER — FUROSEMIDE 40 MG PO TABS: ORAL_TABLET | ORAL | 3 refills | Status: AC

## 2024-11-07 NOTE — Telephone Encounter (Signed)
 Mr.Jagoda, 84 y.o. 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 Completed radiation on 10/25/2024.  Mrs. Steeves called with concerns that Mr. Touhey if now having some swelling /tenderness to bilateral legs/feet(onset 1-2 weeks).  Hurts when stand or walking.  Denies warmth to touch, no weeping of the skin, no SOB.  Reports does wear compression hose but the swelling makes it difficult to put them on at this time.  They didn't want to go to ED if they is something that can be done from home.  He isn't on any diuretics.   RN advised they call his cardiologist or PCP for evaluation, but they thought it was radiation related and called Dr. Alline office.  RN advised that they get to ED if swelling worsens or having SOB.  Dr. Patrcia and Sabra, PA-C made aware.

## 2024-11-07 NOTE — Progress Notes (Signed)
 " Cardiology Office Note:    Date:  11/07/2024   ID:  Ruben Burton, DOB 09/05/1941, MRN [id number]  PCP:  Loreli Elsie JONETTA Mickey., MD  Cardiologist:  Lonni Cash, MD  Electrophysiologist:  None   Referring MD: Loreli Elsie JONETTA Mickey., MD   Chief Complaint  Patient presents with   Leg Swelling    History of Present Illness:    Ruben Burton is a 84 y.o. male with a hx of CAD status post CABG in 2011, postop atrial fibrillation, hypertension, hyperlipidemia who presents for an acute visit for lower extremity edema.  He underwent CABG in 2011 with LIMA-LAD, SVG-diagonal after presenting with NSTEMI.  Echocardiogram at that time showed EF 55 to 60%.  Had postoperative A-fib but has not had any known A-fib since.  Echocardiogram 10-25 showed EF 60 to 65%, normal RV function, no significant valvular disease.  He started radiation treatment for prostate cancer and called office today stating that he had had worsening lower extremity edema.  He reports he has been having swelling in his legs over the last 3 to 4 weeks.  Primarily in his feet but has extended up his legs.  He does not weigh himself at home but weight up 6 pounds on our scales.  Denies any dyspnea or chest pain.  Does report some lightheadedness but denies any syncope.  Reports rare palpitations.  Wt Readings from Last 3 Encounters:  11/07/24 214 lb (97.1 kg)  10/11/24 190 lb (86.2 kg)  08/25/24 205 lb (93 kg)      Past Medical History:  Diagnosis Date   Arthritis    mild; fingers (10/16/2016)   CAD, ARTERY BYPASS GRAFT 03/07/2010   Qualifier: Diagnosis of  By: Wynetta, CMA, Carol     Cancer [facility])    skin- multiple   Coronary artery disease    a. CABG in 2011 b. Cath 10/16/16 - chronic total occlusion of mid LAD with patietn LIMA to mid LAD; severe ostial stenosis to large diagonal with patent SVG to diagonal; moderate non obstrctive RCA, normal LV function. Tx Rx   Elevated PSA    GERD (gastroesophageal reflux  disease)    Hx of cardiovascular stress test    a. ETT-MV 1/14: no ischemia; not gated   Hyperlipidemia    Hypertension    Paroxysmal atrial fibrillation (HCC)    PLEURAL EFFUSION, LEFT 03/07/2010   Qualifier: Diagnosis of  By: Wynetta, CMA, Carol     Precordial pain    Seasonal allergies    Unstable angina pectoris (HCC) 10/16/2016    Past Surgical History:  Procedure Laterality Date   BIOPSY  09/16/2019   Procedure: BIOPSY;  Surgeon: Golda Claudis PENNER, MD;  [facility]: [facility];  Service: Endoscopy;;  gastric    CARDIAC CATHETERIZATION  ~ 2011; 10/16/2016   CARDIAC CATHETERIZATION N/A 10/16/2016   Procedure: Left Heart Cath and Cors/Grafts Angiography;  Surgeon: Lonni JONETTA Cash, MD;  Location: [facility] INVASIVE CV LAB;  Service: Cardiovascular;  Laterality: N/A;   CHOLECYSTECTOMY OPEN  ~ 1992   COLONOSCOPY N/A 12/01/2013   Procedure: COLONOSCOPY;  Surgeon: Claudis PENNER Golda, MD;  Location: [facility];  Service: Endoscopy;  Laterality: N/A;  830   COLONOSCOPY WITH PROPOFOL  N/A 09/16/2019   Procedure: COLONOSCOPY WITH PROPOFOL ;  Surgeon: Golda Claudis PENNER, MD;  Location: [facility];  Service: Endoscopy;  Laterality: N/A;   CORONARY ARTERY BYPASS GRAFT  02/13/2010   2V (LIMA to LAD, SVG to diagonal)  ESOPHAGOGASTRODUODENOSCOPY (EGD) WITH ESOPHAGEAL DILATION     ESOPHAGOGASTRODUODENOSCOPY (EGD) WITH PROPOFOL  N/A 09/16/2019   Procedure: ESOPHAGOGASTRODUODENOSCOPY (EGD) WITH PROPOFOL ;  Surgeon: Golda Claudis PENNER, MD;  [facility]: [facility];  Service: Endoscopy;  Laterality: N/A;  955am   EXPLORATORY LAPAROTOMY  ~ 1995   for perforated duodenal ulcer   HYDROCELE EXCISION     POLYPECTOMY  09/16/2019   Procedure: POLYPECTOMY;  Surgeon: Golda Claudis PENNER, MD;  Location: [facility];  Service: Endoscopy;;  colon   PROSTATE BIOPSY     RADIOACTIVE SEED IMPLANT N/A 08/25/2024   Procedure: INSERTION, RADIATION SOURCE, PROSTATE;  Surgeon: Renda Glance, MD;  Location: [facility];   Service: Urology;  Laterality: N/A;   SPACE OAR INSTILLATION N/A 08/25/2024   Procedure: INJECTION, HYDROGEL SPACER;  Surgeon: Renda Glance, MD;  Location: [facility];  Service: Urology;  Laterality: N/A;    Current Medications: Active Medications[1]   Allergies:   Omeprazole , Pantoprazole , Pollen extract, and Oxytetracycline   Social History   Socioeconomic History   Marital status: Married    Spouse name: Not on file   Number of children: 2   Years of education: Not on file   Highest education level: Not on file  Occupational History   Occupation: retirede    Employer: RETIRED  Tobacco Use   Smoking status: Former    Current packs/day: 0.00    Average packs/day: 0.3 packs/day for 1 year (0.3 ttl pk-yrs)    Types: Cigarettes    Start date: 93    Quit date: 1960    Years since quitting: 66.0    Passive exposure: Never   Smokeless tobacco: Never  Vaping Use   Vaping status: Never Used  Substance and Sexual Activity   Alcohol  use: No   Drug use: No   Sexual activity: Yes  Other Topics Concern   Not on file  Social History Narrative   Not on file   Social Drivers of Health   Tobacco Use: Medium Risk (10/11/2024)   Patient History    Smoking Tobacco Use: Former    Smokeless Tobacco Use: Never    Passive Exposure: Never  Programmer, Applications: Not on file  Food Insecurity: No Food Insecurity (07/28/2024)   Epic    Worried About Programme Researcher, Broadcasting/film/video in the Last Year: Never true    Ran Out of Food in the Last Year: Never true  Transportation Needs: No Transportation Needs (07/28/2024)   Epic    Lack of Transportation (Medical): No    Lack of Transportation (Non-Medical): No  Physical Activity: Not on file  Stress: Not on file  Social Connections: Not on file  Depression (PHQ2-9): Low Risk (07/28/2024)   Depression (PHQ2-9)    PHQ-2 Score: 0  Alcohol  Screen: Low Risk (05/10/2024)   Alcohol  Screen    Last Alcohol  Screening Score (AUDIT): 0  Housing: Low Risk  (07/28/2024)   Epic    Unable to Pay for Housing in the Last Year: No    Number of Times Moved in the Last Year: 0    Homeless in the Last Year: No  Utilities: Not At Risk (07/28/2024)   Epic    Threatened with loss of utilities: No  Health Literacy: Not on file     Family History: The patient's family history includes Heart attack in his brother; Hypertension in his brother, father, and mother; Other in his brother and mother; Stroke in his father. There is no history of Colon cancer.  ROS:  Please see the history of present illness.     All other systems reviewed and are negative.  EKGs/Labs/Other Studies Reviewed:    The following studies were reviewed today:   EKG:   11/07/2024: Sinus rhythm, frequent PVCs, rate 73  Recent Labs: 08/18/2024: Hemoglobin 15.0; Platelets 158 08/25/2024: BUN 17; Creatinine, Ser 1.10; Potassium 4.8; Sodium 144  Recent Lipid Panel    Component Value Date/Time   CHOL 116 05/14/2010 0908   TRIG 141.0 05/14/2010 0908   HDL 32.80 (L) 05/14/2010 0908   CHOLHDL 4 05/14/2010 0908   VLDL 28.2 05/14/2010 0908   LDLCALC 55 05/14/2010 0908    Physical Exam:    VS:  BP 120/60 (BP Location: Right Arm, Patient Position: Sitting, Cuff Size: Large)   Pulse 72   Ht 6' 1 (1.854 m)   Wt 214 lb (97.1 kg)   SpO2 95%   BMI 28.23 kg/m     Wt Readings from Last 3 Encounters:  11/07/24 214 lb (97.1 kg)  10/11/24 190 lb (86.2 kg)  08/25/24 205 lb (93 kg)     GEN:  Well nourished, well developed in no acute distress HEENT: Normal NECK: No JVD; No carotid bruits LYMPHATICS: No lymphadenopathy CARDIAC: Irregular, normal rate no murmurs, rubs, gallops RESPIRATORY:  Clear to auscultation without rales, wheezing or rhonchi  ABDOMEN: Soft, non-tender, non-distended MUSCULOSKELETAL: 1+ bilateral lower extremity edema; No deformity  SKIN: Warm and dry NEUROLOGIC:  Alert and oriented x 3 PSYCHIATRIC:  Normal affect   ASSESSMENT:    1. Lower extremity  edema   2. Frequent PVCs   3. Coronary artery disease involving native coronary artery of native heart without angina pectoris   4. Essential hypertension   5. Hyperlipidemia, unspecified hyperlipidemia type   6. Medication management    PLAN:    Lower extremity edema: 1+ lower extremity edema on exam today.  No JVD and lungs are clear.  Weight is up 6 pounds from prior clinic visit.  Check CMET, BNP.  Check lower extremity duplex to rule out DVT given cancer history.  Recommend compression stockings.  Will prescribe Lasix  40 mg daily x 3 days and can then take as needed; would monitor daily weights and take Lasix  if gains more than 3 pounds 1 day or 5 pounds in 1 week  Frequent PVCs: Noted on EKG in clinic today.  Plan Zio patch x 3 days to quantify PVC burden.  Check electrolytes, TSH  CAD: Status post CABG in 2011.  Denies anginal symptoms. - Continue aspirin , statin  Hypertension: On metoprolol  25 mg twice daily.  Appears controlled  Hyperlipidemia: On rosuvastatin  20 mg daily.  LDL 42 on 12/09/2023    Medication Adjustments/Labs and Tests Ordered: Current medicines are reviewed at length with the patient today.  Concerns regarding medicines are outlined above.  Orders Placed This Encounter  Procedures   TSH   B Nat Peptide   Comp Met (CMET)   Magnesium    Basic Metabolic Panel (BMET)   Magnesium    LONG TERM MONITOR (3-14 DAYS)   EKG 12-Lead   VAS US  LOWER EXTREMITY VENOUS (DVT)   Meds ordered this encounter  Medications   furosemide  (LASIX ) 40 MG tablet    Sig: Take one (1) tablet by mouth daily for the next Three (3) days. THEN take one (1) tablet as need for weight gain of three (3) pounds overnight or five (5) pounds in one week, or for noticeable swelling or edema.    Dispense:  90 tablet    Refill:  3    Patient Instructions  Medication Instructions:  START Furosemide  (Lasix ) 40 mg. Take one (1) tablet by mouth daily for the next Three (3) days. THEN take one (1)  tablet as need for weight gain of three (3) pounds overnight or five (5) pounds in one week, or for noticeable swelling or edema.  *If you need a refill on your cardiac medications before your next appointment, please call your pharmacy*  Lab Work: CMP, BNP, Magnesium , TSH TODAY  BMP, Magnesium  in ONE WEEK If you have labs (blood work) drawn today and your tests are completely normal, you will receive your results only by: MyChart Message (if you have MyChart) OR A paper copy in the mail If you have any lab test that is abnormal or we need to change your treatment, we will call you to review the results.  Testing/Procedures: Lower Extremity Venous Doppler, 3 Day Zio heart Monitor  Follow-Up: At [facility], you and your health needs are our priority.  As part of our continuing mission to provide you with exceptional heart care, our providers are all part of one team.  This team includes your primary Cardiologist (physician) and Advanced Practice Providers or APPs (Physician Assistants and Nurse Practitioners) who all work together to provide you with the care you need, when you need it.  Your next appointment:   4 week(s)  Provider:   Lonni Cash, MD or any APP   We recommend signing up for the patient portal called MyChart.  Sign up information is provided on this After Visit Summary.  MyChart is used to connect with patients for Virtual Visits (Telemedicine).  Patients are able to view lab/test results, encounter notes, upcoming appointments, etc.  Non-urgent messages can be sent to your provider as well.   To learn more about what you can do with MyChart, go to [email or web address].   Other Instructions Your physician has requested that you have a lower extremity venous duplex. This test is an ultrasound of the veins in the legs or arms. It looks at venous blood flow that carries blood from the heart to the legs or arms. Allow one hour for a Lower Venous exam.  There are no restrictions or special instructions.  Please note: We ask at that you not bring children with you during ultrasound (echo/ vascular) testing. Due to room size and safety concerns, children are not allowed in the ultrasound rooms during exams. Our front office staff cannot provide observation of children in our lobby area while testing is being conducted. An adult accompanying a patient to their appointment will only be allowed in the ultrasound room at the discretion of the ultrasound technician under special circumstances. We apologize for any inconvenience.  ZIO XT- Long Term Monitor Instructions  Your physician has requested you wear a ZIO patch monitor for 3 days.  This is a single patch monitor. [employer] supplies one patch monitor per enrollment. Additional stickers are not available. Please do not apply patch if you will be having a Nuclear Stress Test,  Echocardiogram, Cardiac CT, MRI, or Chest Xray during the period you would be wearing the  monitor. The patch cannot be worn during these tests. You cannot remove and re-apply the  ZIO XT patch monitor.  Your ZIO patch monitor will be mailed 3 day USPS to your address on file. It may take 3-5 days  to receive your monitor after you have been enrolled.  Once you have  received your monitor, please review the enclosed instructions. Your monitor  has already been registered assigning a specific monitor serial # to you.  Billing and Patient Assistance Program Information  We have supplied [employer] with any of your insurance information on file for billing purposes. [employer] offers a sliding scale Patient Assistance Program for patients that do not have  insurance, or whose insurance does not completely cover the cost of the ZIO monitor.  You must apply for the Patient Assistance Program to qualify for this discounted rate.  To apply, please call [employer] at [phone], select option 4, select option 2, ask to apply for  Patient  Assistance Program. Meredeth will ask your household income, and how many people  are in your household. They will quote your out-of-pocket cost based on that information.  [employer] will also be able to set up a 54-month, interest-free payment plan if needed.  Applying the monitor   Shave hair from upper left chest.  Hold abrader disc by orange tab. Rub abrader in 40 strokes over the upper left chest as  indicated in your monitor instructions.  Clean area with 4 enclosed alcohol  pads. Let dry.  Apply patch as indicated in monitor instructions. Patch will be placed under collarbone on left  side of chest with arrow pointing upward.  Rub patch adhesive wings for 2 minutes. Remove white label marked 1. Remove the white  label marked 2. Rub patch adhesive wings for 2 additional minutes.  While looking in a mirror, press and release button in center of patch. A small green light will  flash 3-4 times. This will be your only indicator that the monitor has been turned on.  Do not shower for the first 24 hours. You may shower after the first 24 hours.  Press the button if you feel a symptom. You will hear a small click. Record Date, Time and  Symptom in the Patient Logbook.  When you are ready to remove the patch, follow instructions on the last 2 pages of Patient  Logbook. Stick patch monitor onto the last page of Patient Logbook.  Place Patient Logbook in the blue and white box. Use locking tab on box and tape box closed  securely. The blue and white box has prepaid postage on it. Please place it in the mailbox as  soon as possible. Your physician should have your test results approximately 7 days after the  monitor has been mailed back to [facility].  Call [facility] Customer Care at [phone] if you have questions regarding  your ZIO XT patch monitor. Call them immediately if you see an orange light blinking on your  monitor.  If your monitor falls off in less than 4 days,  contact our Monitor department at [phone].  If your monitor becomes loose or falls off after 4 days call [employer] at [phone] for  suggestions on securing your monitor     [employer]  Outlet Store  Training And Development Officer for [facility] Mailing Address:  [address];   [address]  [place], [place] [address]  Tel [phone] Fx [phone]     High Quality Legwear for Today's Active Lifestyles Maximum Compression at the ankle. Compression lessens gradually up the leg.   We manufacture a wide range of compression hosiery for men and women in  different styles, constructions and levels of support.  How Compression Hosiery Works Regulatory Affairs Officer, [employer]. compression hosiery works by applying graduated pressure to the  muscles  and veins in the legs.  When the calf muscle contracts such as during walking  the compression hosiery will give and then return to its original position. By doing so  the hosiery is assists your body's circulatory wellness.  The result is increased leg health and vitality.   Maximum Compression at the ankle Compression lessens gradually up the leg  We Offer: Sheer & Opaque Stockings       COLORS:  Nude, black, white and misc. prints Below Knee Thigh High Pantyhose  High Quality Legwear for Today's Active Lifestyles We manufacture a wide range of compression hosiery for men and women in different styles, construction sand levels of support.  Socks:                     Sheer & Opaque   Compression Levels Include:                                  Stockings Men's               Below Knee                8-15 mmHg   Women's         Thigh High                 15-20 mmHg  Unisex             Pantyhose                 20-30 mmHg                                                                      30-40 mmHg  4 Simple Ways to Order   Email  [email or web address] Mail/Email orders are subject to processing and handling charges.  Allow 7-10 days for receipt.  Phone [phone]  Please allow 24 hours for return call.   In Person  We recommend calling prior to your visit to confirm store hours as they may change due to holiday, weather, and maintenance.   By Mail When placing an order, please have the following information available. Our representatives are available to assist.     Measurements    THIGH      in.   CALF        in.   ANKLE     in.    Compression  8-15 mmHg 15-20 mmHg**   20-30 mmHg 30-40 mmHg   WOMEN'S MEN'S  Shoe Size Sock Size Shoe Size Sock Size  4 - 5 Small 7.5 and Under Small  5.5 - 7.5 Medium 8 - 10 Medium  8 - 10 Large 10.5 - 12 Large  10.5 and Over X-Large 12.5 and Over X-Large   Knee High Size Chart  Length from CALF MEASUREMENT  floor to bend   in knee. 11 12 13 14 15 16 17 18 19 20 21 22  14  S S S S M M L L L XL XL XL  15 S S S M M L L L XL XL XXL XXL  16 S S M M M L L  L XL XXL XXL XXL  17 S M M M M L L XL XL XXL XXL XXL  18 M M M M L L L XL XL XXL XXL XXL  19 M M M M L L XL XL XL XXL XXL XXL   Thigh High Circumference Sizing Chart                 S M L XL XXL  ANKLE 6.5 - 8 8 - 9.5 9.5 - 11 11 - 12.5 12.5 - 14  CALF 10.5 - 14.5 11.5 - 15.5 12.5 - 17 13.5 - 17.5 14.5 - 19.5  THIGH 15.5 - 22 17.5 - 24 19.5 - 26 22 - 28 26 - 32  HIP UP TO 40 UP TO 44 UP TO 48' UP TO 52 UP TO 56   Pantyhose Size Chart  Height Petite Medium Tall X-Tall Queen Queen +   Weight Weight Weight Weight Weight Weight  4'11 95-130 135      5'0 95-125 130-145   170-185   5'1 90-120 125-155 160-165  170-195   5'2 90-115 120-145 150-165  170-195   5'3 90-110 115-140 145-165  170-200 200-225  5'4 100-105 110-135 140-160 165 170-200 200-225  5'5 100 105-130 135-160 165 170-200 200-225  5'6  110-125 130-155 160-165 170-200 200-225  5'7  110-120 125-150 155-165 170-200 195-225  5'8   120-145 150-165 170-200 190-225  5'9   125-140  145-170 175-190 185-220  5'10   125-135 140-185  185-215  5'11   130-135 140-185  190-210               Signed, Lonni LITTIE Nanas, MD  11/07/2024 6:17 PM    [facility]     [1]  Current Meds  Medication Sig   aspirin  EC 81 MG tablet Take 1 tablet (81 mg total) by mouth every evening.   B Complex-C (B-COMPLEX WITH VITAMIN C) tablet Take 1 tablet by mouth daily after breakfast.   calcium  carbonate (OS-CAL) 1250 (500 Ca) MG chewable tablet Chew 1 tablet by mouth 2 (two) times daily.    Cholecalciferol (VITAMIN D-1000 MAX ST) 25 MCG (1000 UT) tablet Take 2,000 Units by mouth daily.   Cyanocobalamin  (B-12) 1000 MCG TABS Take 1,000 mcg by mouth daily.   famotidine (PEPCID) 20 MG tablet Take 20 mg by mouth daily.   furosemide  (LASIX ) 40 MG tablet Take one (1) tablet by mouth daily for the next Three (3) days. THEN take one (1) tablet as need for weight gain of three (3) pounds overnight or five (5) pounds in one week, or for noticeable swelling or edema.   metoprolol  (LOPRESSOR ) 25 MG tablet Take one tablet by mouth twice daily.   mupirocin 2% oint-hydrocortisone 2.5% cream-nystatin cream-zinc  oxide 13% oint 1:1:1:5 mixture Apply topically 2 (two) times daily for 14 days. Apply a thin layer to the affected area BID   nitroGLYCERIN  (NITROSTAT ) 0.4 MG SL tablet Place 1 tablet (0.4 mg total) under the tongue every 5 (five) minutes as needed for chest pain.   rosuvastatin  (CRESTOR ) 20 MG tablet TAKE 1 TABLET DAILY   tamsulosin  (FLOMAX ) 0.4 MG CAPS capsule Take 0.4 mg by mouth daily.    traMADol  (ULTRAM ) 50 MG tablet Take 1-2 tablets (50-100 mg total) by mouth every 6 (six) hours as needed (pain).   "

## 2024-11-07 NOTE — Telephone Encounter (Signed)
 Spoke with pt wife, she reports the patient has finished his radiation form the seed implant and he is having swelling in his feet and ankles. She reports this morning he is unable to get his shoes on. He reports a non-productive sough but no SOB or orthopnea. She reports his genitales are also swollen from the radiation and so his stream to void is not very strong but is getting better. She called the radiologist and was told it was a heart problem to call us . Follow up scheduled with dr kate (DOD).

## 2024-11-07 NOTE — Patient Instructions (Addendum)
 Medication Instructions:  START Furosemide  (Lasix ) 40 mg. Take one (1) tablet by mouth daily for the next Three (3) days. THEN take one (1) tablet as need for weight gain of three (3) pounds overnight or five (5) pounds in one week, or for noticeable swelling or edema.  *If you need a refill on your cardiac medications before your next appointment, please call your pharmacy*  Lab Work: CMP, BNP, Magnesium , TSH TODAY  BMP, Magnesium  in ONE WEEK If you have labs (blood work) drawn today and your tests are completely normal, you will receive your results only by: MyChart Message (if you have MyChart) OR A paper copy in the mail If you have any lab test that is abnormal or we need to change your treatment, we will call you to review the results.  Testing/Procedures: Lower Extremity Venous Doppler, 3 Day Zio heart Monitor  Follow-Up: At [facility], you and your health needs are our priority.  As part of our continuing mission to provide you with exceptional heart care, our providers are all part of one team.  This team includes your primary Cardiologist (physician) and Advanced Practice Providers or APPs (Physician Assistants and Nurse Practitioners) who all work together to provide you with the care you need, when you need it.  Your next appointment:   4 week(s)  Provider:   Lonni Cash, MD or any APP   We recommend signing up for the patient portal called MyChart.  Sign up information is provided on this After Visit Summary.  MyChart is used to connect with patients for Virtual Visits (Telemedicine).  Patients are able to view lab/test results, encounter notes, upcoming appointments, etc.  Non-urgent messages can be sent to your provider as well.   To learn more about what you can do with MyChart, go to [email or web address].   Other Instructions Your physician has requested that you have a lower extremity venous duplex. This test is an ultrasound of the veins in the  legs or arms. It looks at venous blood flow that carries blood from the heart to the legs or arms. Allow one hour for a Lower Venous exam. There are no restrictions or special instructions.  Please note: We ask at that you not bring children with you during ultrasound (echo/ vascular) testing. Due to room size and safety concerns, children are not allowed in the ultrasound rooms during exams. Our front office staff cannot provide observation of children in our lobby area while testing is being conducted. An adult accompanying a patient to their appointment will only be allowed in the ultrasound room at the discretion of the ultrasound technician under special circumstances. We apologize for any inconvenience.  ZIO XT- Long Term Monitor Instructions  Your physician has requested you wear a ZIO patch monitor for 3 days.  This is a single patch monitor. [employer] supplies one patch monitor per enrollment. Additional stickers are not available. Please do not apply patch if you will be having a Nuclear Stress Test,  Echocardiogram, Cardiac CT, MRI, or Chest Xray during the period you would be wearing the  monitor. The patch cannot be worn during these tests. You cannot remove and re-apply the  ZIO XT patch monitor.  Your ZIO patch monitor will be mailed 3 day USPS to your address on file. It may take 3-5 days  to receive your monitor after you have been enrolled.  Once you have received your monitor, please review the enclosed instructions. Your monitor  has already been registered  assigning a specific monitor serial # to you.  Billing and Patient Assistance Program Information  We have supplied [employer] with any of your insurance information on file for billing purposes. [employer] offers a sliding scale Patient Assistance Program for patients that do not have  insurance, or whose insurance does not completely cover the cost of the ZIO monitor.  You must apply for the Patient Assistance Program to  qualify for this discounted rate.  To apply, please call [employer] at [phone], select option 4, select option 2, ask to apply for  Patient Assistance Program. Meredeth will ask your household income, and how many people  are in your household. They will quote your out-of-pocket cost based on that information.  [employer] will also be able to set up a 86-month, interest-free payment plan if needed.  Applying the monitor   Shave hair from upper left chest.  Hold abrader disc by orange tab. Rub abrader in 40 strokes over the upper left chest as  indicated in your monitor instructions.  Clean area with 4 enclosed alcohol  pads. Let dry.  Apply patch as indicated in monitor instructions. Patch will be placed under collarbone on left  side of chest with arrow pointing upward.  Rub patch adhesive wings for 2 minutes. Remove white label marked 1. Remove the white  label marked 2. Rub patch adhesive wings for 2 additional minutes.  While looking in a mirror, press and release button in center of patch. A small green light will  flash 3-4 times. This will be your only indicator that the monitor has been turned on.  Do not shower for the first 24 hours. You may shower after the first 24 hours.  Press the button if you feel a symptom. You will hear a small click. Record Date, Time and  Symptom in the Patient Logbook.  When you are ready to remove the patch, follow instructions on the last 2 pages of Patient  Logbook. Stick patch monitor onto the last page of Patient Logbook.  Place Patient Logbook in the blue and white box. Use locking tab on box and tape box closed  securely. The blue and white box has prepaid postage on it. Please place it in the mailbox as  soon as possible. Your physician should have your test results approximately 7 days after the  monitor has been mailed back to [facility].  Call [facility] Customer Care at [phone] if you have questions regarding  your ZIO XT  patch monitor. Call them immediately if you see an orange light blinking on your  monitor.  If your monitor falls off in less than 4 days, contact our Monitor department at [phone].  If your monitor becomes loose or falls off after 4 days call [employer] at [phone] for  suggestions on securing your monitor     [employer]  Outlet Store  Training And Development Officer for [facility] Mailing Address:  [address];   [address]  [place], [place] [address]  Tel [phone] Fx [phone]     High Quality Legwear for Today's Active Lifestyles Maximum Compression at the ankle. Compression lessens gradually up the leg.   We manufacture a wide range of compression hosiery for men and women in  different styles, constructions and levels of support.  How Compression Hosiery Works Regulatory Affairs Officer, [employer]. compression hosiery works by applying graduated pressure to the  muscles and veins in the legs.  When the calf muscle contracts such as during walking  the compression hosiery will give and then return to its original position. By doing so  the hosiery is assists your body's circulatory wellness.  The result is increased leg health and vitality.   Maximum Compression at the ankle Compression lessens gradually up the leg  We Offer: Sheer & Opaque Stockings       COLORS:  Nude, black, white and misc. prints Below Knee Thigh High Pantyhose  High Quality Legwear for Today's Active Lifestyles We manufacture a wide range of compression hosiery for men and women in different styles, construction sand levels of support.  Socks:                     Sheer & Opaque   Compression Levels Include:                                  Stockings Men's               Below Knee                8-15 mmHg   Women's         Thigh High                 15-20 mmHg  Unisex             Pantyhose                 20-30 mmHg                                                                       30-40 mmHg  4 Simple Ways to Order   Email  [email or web address] Mail/Email orders are subject to processing and handling charges. Allow 7-10 days for receipt.  Phone [phone]  Please allow 24 hours for return call.   In Person  We recommend calling prior to your visit to confirm store hours as they may change due to holiday, weather, and maintenance.   By Mail When placing an order, please have the following information available. Our representatives are available to assist.     Measurements    THIGH      in.   CALF        in.   ANKLE     in.    Compression  8-15 mmHg 15-20 mmHg**   20-30 mmHg 30-40 mmHg   WOMEN'S MEN'S  Shoe Size Sock Size Shoe Size Sock Size  4 - 5 Small 7.5 and Under Small  5.5 - 7.5 Medium 8 - 10 Medium  8 - 10 Large 10.5 - 12 Large  10.5 and Over X-Large 12.5 and Over X-Large   Knee High Size Chart  Length from CALF MEASUREMENT  floor to bend   in knee. 11 12 13 14 15 16 17 18 19 20 21 22  14  S S S S M M L L L XL XL XL  15 S S S M M L L L XL XL XXL XXL  16 S S M M M L L L XL XXL XXL XXL  17 S M M M M L L XL XL  XXL XXL XXL  18 M M M M L L L XL XL XXL XXL XXL  19 M M M M L L XL XL XL XXL XXL XXL   Thigh High Circumference Sizing Chart                 S M L XL XXL  ANKLE 6.5 - 8 8 - 9.5 9.5 - 11 11 - 12.5 12.5 - 14  CALF 10.5 - 14.5 11.5 - 15.5 12.5 - 17 13.5 - 17.5 14.5 - 19.5  THIGH 15.5 - 22 17.5 - 24 19.5 - 26 22 - 28 26 - 32  HIP UP TO 40 UP TO 44 UP TO 48' UP TO 52 UP TO 56   Pantyhose Size Chart  Height Petite Medium Tall X-Tall Queen Queen +   Weight Weight Weight Weight Weight Weight  4'11 95-130 135      5'0 95-125 130-145   170-185   5'1 90-120 125-155 160-165  170-195   5'2 90-115 120-145 150-165  170-195   5'3 90-110 115-140 145-165  170-200 200-225  5'4 100-105 110-135 140-160 165 170-200 200-225  5'5 100 105-130 135-160 165 170-200 200-225  5'6  110-125  130-155 160-165 170-200 200-225  5'7  110-120 125-150 155-165 170-200 195-225  5'8   120-145 150-165 170-200 190-225  5'9   125-140 145-170 175-190 185-220  5'10   125-135 140-185  185-215  5'11   130-135 140-185  190-210

## 2024-11-07 NOTE — Progress Notes (Unsigned)
 Enrolled for [employer] to mail a ZIO XT long term holter monitor to the patients address on file.

## 2024-11-07 NOTE — Telephone Encounter (Signed)
 Pt c/o swelling/edema: STAT if pt has developed SOB within 24 hours  If swelling, where is the swelling located? Feet and ankles   How much weight have you gained and in what time span? Not sure   Have you gained 2 pounds in a day or 5 pounds in a week?   Do you have a log of your daily weights (if so, list)? No   Are you currently taking a fluid pill? No   Are you currently SOB? Yes, some   Have you traveled recently in a car or plane for an extended period of time? Wife calling on behalf of pt. He has been having bad swelling in legs and ankles. He has also been having difficulty urinating. Please advise.

## 2024-11-08 ENCOUNTER — Ambulatory Visit: Payer: Self-pay | Admitting: Cardiology

## 2024-11-08 DIAGNOSIS — I493 Ventricular premature depolarization: Secondary | ICD-10-CM

## 2024-11-08 DIAGNOSIS — I471 Supraventricular tachycardia, unspecified: Secondary | ICD-10-CM

## 2024-11-08 LAB — COMPREHENSIVE METABOLIC PANEL WITH GFR
ALT: 20 IU/L (ref 0–44)
AST: 25 IU/L (ref 0–40)
Albumin: 4.1 g/dL (ref 3.7–4.7)
Alkaline Phosphatase: 63 IU/L (ref 48–129)
BUN/Creatinine Ratio: 11 (ref 10–24)
BUN: 11 mg/dL (ref 8–27)
Bilirubin Total: 1.2 mg/dL (ref 0.0–1.2)
CO2: 21 mmol/L (ref 20–29)
Calcium: 8.6 mg/dL (ref 8.6–10.2)
Chloride: 108 mmol/L — ABNORMAL HIGH (ref 96–106)
Creatinine, Ser: 1.04 mg/dL (ref 0.76–1.27)
Globulin, Total: 1.9 g/dL (ref 1.5–4.5)
Glucose: 94 mg/dL (ref 70–99)
Potassium: 4.5 mmol/L (ref 3.5–5.2)
Sodium: 144 mmol/L (ref 134–144)
Total Protein: 6 g/dL (ref 6.0–8.5)
eGFR: 71 mL/min/1.73

## 2024-11-08 LAB — MAGNESIUM: Magnesium: 2.1 mg/dL (ref 1.6–2.3)

## 2024-11-08 LAB — BRAIN NATRIURETIC PEPTIDE: BNP: 124 pg/mL — ABNORMAL HIGH (ref 0.0–100.0)

## 2024-11-08 LAB — TSH: TSH: 4 u[IU]/mL (ref 0.450–4.500)

## 2024-11-10 ENCOUNTER — Telehealth: Payer: Self-pay | Admitting: Cardiology

## 2024-11-10 DIAGNOSIS — I2510 Atherosclerotic heart disease of native coronary artery without angina pectoris: Secondary | ICD-10-CM

## 2024-11-10 NOTE — Progress Notes (Signed)
 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 Patient proceed with treatment recommendations of LT-ADT, brachy boost, and 5 weeks of IMRT and had his final radiation treatment on 10/25/24.   Patient will be scheduled for a post treatment nurse call and has his first post treatment PSA on 11/16/24 at [facility], followed by MD visit on 11/23/24.

## 2024-11-10 NOTE — Telephone Encounter (Signed)
Wife called to follow-up on patient's lab results.

## 2024-11-10 NOTE — Telephone Encounter (Signed)
 Spoke with wife, Orlean, in regards to pt lab results.  Per Dr. Kate: Stable kidney function and electrolytes. Mildly elevated BNP. Since we started Lasix , would continue with plan to recheck BMET/mag in 1 week   Thoroughly reviewed these with Orlean and next steps. Also asked about Lasix  medication and if he could stop it. Had her write down the as needed details that were prescribed.    Take one (1) tablet by mouth daily for the next Three (3) days. THEN take one (1) tablet as need for weight gain of three (3) pounds overnight or five (5) pounds in one week, or for noticeable swelling or edema.        Will place orders for lab work that Dr Kate wants next week and informed Orlean to have him get these done Monday or Tuesday. Also requesting appointment and end of January if available, as Dr Verlin didn't have any openings. Appt made for 11/28/24. Verbalizes understanding of plan.

## 2024-11-11 ENCOUNTER — Ambulatory Visit (HOSPITAL_COMMUNITY)
Admission: RE | Admit: 2024-11-11 | Discharge: 2024-11-11 | Disposition: A | Source: Ambulatory Visit | Attending: Cardiology

## 2024-11-11 DIAGNOSIS — R600 Localized edema: Secondary | ICD-10-CM | POA: Insufficient documentation

## 2024-11-14 ENCOUNTER — Other Ambulatory Visit: Payer: Self-pay | Admitting: *Deleted

## 2024-11-14 ENCOUNTER — Ambulatory Visit (HOSPITAL_COMMUNITY)

## 2024-11-14 DIAGNOSIS — Z79899 Other long term (current) drug therapy: Secondary | ICD-10-CM

## 2024-11-16 LAB — BASIC METABOLIC PANEL WITH GFR
BUN/Creatinine Ratio: 12 (ref 10–24)
BUN: 13 mg/dL (ref 8–27)
CO2: 22 mmol/L (ref 20–29)
Calcium: 9 mg/dL (ref 8.6–10.2)
Chloride: 105 mmol/L (ref 96–106)
Creatinine, Ser: 1.11 mg/dL (ref 0.76–1.27)
Glucose: 93 mg/dL (ref 70–99)
Potassium: 4.5 mmol/L (ref 3.5–5.2)
Sodium: 140 mmol/L (ref 134–144)
eGFR: 65 mL/min/1.73

## 2024-11-16 LAB — MAGNESIUM: Magnesium: 2 mg/dL (ref 1.6–2.3)

## 2024-11-16 NOTE — Progress Notes (Signed)
" °  Radiation Oncology         [phone] ________________________________  Name: Ruben Burton MRN: [id number]  Date of Service: 11/22/2024  DOB: 1941-11-02  Post Treatment Telephone Note  Diagnosis:  Malignant neoplasm of prostate   First Treatment Date: 2024-09-20 Last Treatment Date: 2024-10-25   Plan Name: Prostate_Pelv Site: Prostate Technique: IMRT Mode: Photon Dose Per Fraction: 1.8 Gy Prescribed Dose (Delivered / Prescribed): 45 Gy / 45 Gy Prescribed Fxs (Delivered / Prescribed): 25 / 25   Plan Name: Prostate Seed Implant Site: Prostate Technique: Radioactive Seed Implant I-125 Mode: Brachytherapy Dose Per Fraction: 110 Gy Prescribed Dose (Delivered / Prescribed): 110 Gy / 110 Gy Prescribed Fxs (Delivered / Prescribed): 1 / 1  Pre Treatment IPSS Score: 12 (as documented in the provider consult note)  The patient was available for call today.   Symptoms of fatigue have improved since completing therapy.  Symptoms of bladder changes have improved since completing therapy. Current symptoms include frequency and nocturia, and medications for bladder symptoms include Flomax .  Symptoms of bowel changes have improved since completing therapy. Current symptoms include N/A.  Post Treatment IPSS Score: 20 IPSS Questionnaire (AUA-7): Over the past month   1)  How often have you had a sensation of not emptying your bladder completely after you finish urinating?  4 - More than half the time  2)  How often have you had to urinate again less than two hours after you finished urinating? 3 - About half the time  3)  How often have you found you stopped and started again several times when you urinated?  0 - Not at all  4) How difficult have you found it to postpone urination?  3 - About half the time  5) How often have you had a weak urinary stream?  5 - Almost always  6) How often have you had to push or strain to begin urination?  0 - Not at all  7) How many times did you  most typically get up to urinate from the time you went to bed until the time you got up in the morning?  5 - 5+ times  Total score:  20. Which indicates severe symptoms  0-7 mildly symptomatic   8-19 moderately symptomatic   20-35 severely symptomatic   Patient (has a scheduled follow up visit with his urologist, Dr. Noretta Ferrara, on 11/23/24 and a PSA on 11/16/24 for ongoing surveillance. He was counseled that PSA levels will be drawn in the urology office, and was reassured that additional time is expected to improve bowel and bladder symptoms. He was encouraged to call back with concerns or questions regarding radiation.   "

## 2024-11-16 NOTE — Telephone Encounter (Signed)
 Called patient and patient requested appt to see provider. Appt scheduled and results given. Understanding verbalized.

## 2024-11-22 ENCOUNTER — Ambulatory Visit
Admission: RE | Admit: 2024-11-22 | Discharge: 2024-11-22 | Disposition: A | Discharge status: Home / Self Care | Source: Ambulatory Visit | Attending: Radiation Oncology | Admitting: Radiation Oncology

## 2024-11-22 DIAGNOSIS — C61 Malignant neoplasm of prostate: Secondary | ICD-10-CM

## 2024-11-28 ENCOUNTER — Ambulatory Visit: Admitting: Cardiology

## 2024-11-29 DIAGNOSIS — I493 Ventricular premature depolarization: Secondary | ICD-10-CM

## 2024-12-07 ENCOUNTER — Ambulatory Visit: Admitting: Cardiovascular Disease

## 2024-12-07 VITALS — BP 130/80 | HR 59 | Ht 73.0 in | Wt 210.0 lb

## 2024-12-07 DIAGNOSIS — I471 Supraventricular tachycardia, unspecified: Secondary | ICD-10-CM

## 2024-12-07 DIAGNOSIS — I480 Paroxysmal atrial fibrillation: Secondary | ICD-10-CM | POA: Diagnosis not present

## 2024-12-07 DIAGNOSIS — I5032 Chronic diastolic (congestive) heart failure: Secondary | ICD-10-CM

## 2024-12-07 DIAGNOSIS — I10 Essential (primary) hypertension: Secondary | ICD-10-CM

## 2024-12-07 DIAGNOSIS — E7800 Pure hypercholesterolemia, unspecified: Secondary | ICD-10-CM | POA: Diagnosis not present

## 2024-12-07 DIAGNOSIS — I2510 Atherosclerotic heart disease of native coronary artery without angina pectoris: Secondary | ICD-10-CM

## 2024-12-07 NOTE — Patient Instructions (Signed)
 Medication Instructions:  The current medical regimen is effective;  continue present plan and medications.  *If you need a refill on your cardiac medications before your next appointment, please call your pharmacy*  Follow-Up: At [facility], you and your health needs are our priority.  As part of our continuing mission to provide you with exceptional heart care, our providers are all part of one team.  This team includes your primary Cardiologist (physician) and Advanced Practice Providers or APPs (Physician Assistants and Nurse Practitioners) who all work together to provide you with the care you need, when you need it.  Your next appointment:   1 year(s)  Provider:   Lonni Cash, MD    We recommend signing up for the patient portal called MyChart.  Sign up information is provided on this After Visit Summary.  MyChart is used to connect with patients for Virtual Visits (Telemedicine).  Patients are able to view lab/test results, encounter notes, upcoming appointments, etc.  Non-urgent messages can be sent to your provider as well.   To learn more about what you can do with MyChart, go to [email or web address].

## 2024-12-09 ENCOUNTER — Other Ambulatory Visit: Payer: Self-pay | Admitting: Urology

## 2024-12-09 DIAGNOSIS — C61 Malignant neoplasm of prostate: Secondary | ICD-10-CM

## 2024-12-09 NOTE — Addendum Note (Signed)
 Encounter addended by: Sherwood Rise, PA-C on: 12/09/2024 2:49 PM  Actions taken: Clinical Note Signed, Visit diagnoses modified

## 2024-12-12 ENCOUNTER — Ambulatory Visit: Admitting: Physician Assistant

## 2025-04-14 ENCOUNTER — Ambulatory Visit (INDEPENDENT_AMBULATORY_CARE_PROVIDER_SITE_OTHER): Admitting: Otolaryngology
